# Patient Record
Sex: Female | Born: 1965 | Race: Black or African American | Hispanic: No | Marital: Married | Smoking: Current every day smoker
Health system: Southern US, Community
[De-identification: ages and names within clinical notes are randomized; demographics above are authoritative.]

## PROBLEM LIST (undated history)

## (undated) ENCOUNTER — Emergency Department (HOSPITAL_COMMUNITY): Discharge status: Absent without leave | Payer: Self-pay

## (undated) DIAGNOSIS — D649 Anemia, unspecified: Secondary | ICD-10-CM

## (undated) DIAGNOSIS — I10 Essential (primary) hypertension: Secondary | ICD-10-CM

## (undated) DIAGNOSIS — I619 Nontraumatic intracerebral hemorrhage, unspecified: Secondary | ICD-10-CM

## (undated) HISTORY — PX: MYOMECTOMY: SHX85

---

## 2004-04-21 ENCOUNTER — Encounter: Admission: RE | Admit: 2004-04-21 | Discharge: 2004-04-21 | Payer: Self-pay | Admitting: Gastroenterology

## 2004-12-30 ENCOUNTER — Encounter: Admission: RE | Admit: 2004-12-30 | Discharge: 2004-12-30 | Payer: Self-pay | Admitting: Obstetrics and Gynecology

## 2005-02-02 ENCOUNTER — Encounter (INDEPENDENT_AMBULATORY_CARE_PROVIDER_SITE_OTHER): Payer: Self-pay | Admitting: Specialist

## 2005-02-02 ENCOUNTER — Inpatient Hospital Stay (HOSPITAL_COMMUNITY): Admission: RE | Admit: 2005-02-02 | Discharge: 2005-02-04 | Payer: Self-pay | Admitting: Obstetrics and Gynecology

## 2007-02-27 ENCOUNTER — Other Ambulatory Visit: Admission: RE | Admit: 2007-02-27 | Discharge: 2007-02-27 | Payer: Self-pay | Admitting: Obstetrics and Gynecology

## 2007-04-04 ENCOUNTER — Ambulatory Visit (HOSPITAL_COMMUNITY): Admission: RE | Admit: 2007-04-04 | Discharge: 2007-04-04 | Payer: Self-pay | Admitting: Obstetrics and Gynecology

## 2008-02-19 ENCOUNTER — Ambulatory Visit (HOSPITAL_COMMUNITY): Admission: RE | Admit: 2008-02-19 | Discharge: 2008-02-19 | Payer: Self-pay | Admitting: Obstetrics and Gynecology

## 2008-11-14 ENCOUNTER — Encounter (INDEPENDENT_AMBULATORY_CARE_PROVIDER_SITE_OTHER): Payer: Self-pay | Admitting: General Surgery

## 2008-11-14 ENCOUNTER — Ambulatory Visit (HOSPITAL_COMMUNITY): Admission: RE | Admit: 2008-11-14 | Discharge: 2008-11-14 | Payer: Self-pay | Admitting: General Surgery

## 2010-08-30 ENCOUNTER — Emergency Department (HOSPITAL_COMMUNITY): Admission: EM | Admit: 2010-08-30 | Discharge: 2010-08-30 | Payer: Self-pay | Admitting: Emergency Medicine

## 2010-09-02 ENCOUNTER — Inpatient Hospital Stay (HOSPITAL_COMMUNITY): Admission: EM | Admit: 2010-09-02 | Discharge: 2010-09-04 | Payer: Self-pay | Admitting: Emergency Medicine

## 2010-11-28 ENCOUNTER — Encounter: Payer: Self-pay | Admitting: Obstetrics and Gynecology

## 2011-01-20 LAB — BASIC METABOLIC PANEL
BUN: 8 mg/dL (ref 6–23)
Chloride: 99 mEq/L (ref 96–112)
Glucose, Bld: 84 mg/dL (ref 70–99)
Potassium: 3.4 mEq/L — ABNORMAL LOW (ref 3.5–5.1)
Sodium: 140 mEq/L (ref 135–145)

## 2011-01-20 LAB — CBC
HCT: 40.8 % (ref 36.0–46.0)
Hemoglobin: 13.5 g/dL (ref 12.0–15.0)
MCHC: 33.1 g/dL (ref 30.0–36.0)
MCV: 82.8 fL (ref 78.0–100.0)
RDW: 15.4 % (ref 11.5–15.5)
WBC: 12.6 10*3/uL — ABNORMAL HIGH (ref 4.0–10.5)

## 2011-01-20 LAB — ANAEROBIC CULTURE: Gram Stain: NONE SEEN

## 2011-01-20 LAB — DIFFERENTIAL
Basophils Absolute: 0 10*3/uL (ref 0.0–0.1)
Eosinophils Relative: 1 % (ref 0–5)
Lymphocytes Relative: 9 % — ABNORMAL LOW (ref 12–46)
Monocytes Absolute: 1.1 10*3/uL — ABNORMAL HIGH (ref 0.1–1.0)
Monocytes Relative: 9 % (ref 3–12)
Neutro Abs: 10.3 10*3/uL — ABNORMAL HIGH (ref 1.7–7.7)

## 2011-01-20 LAB — STREP A DNA PROBE: Group A Strep Probe: NEGATIVE

## 2011-01-20 LAB — CULTURE, ROUTINE-ABSCESS: Gram Stain: NONE SEEN

## 2011-02-22 LAB — CBC
HCT: 34.8 % — ABNORMAL LOW (ref 36.0–46.0)
Hemoglobin: 11.1 g/dL — ABNORMAL LOW (ref 12.0–15.0)
RBC: 4.34 MIL/uL (ref 3.87–5.11)
WBC: 7.1 10*3/uL (ref 4.0–10.5)

## 2011-02-22 LAB — DIFFERENTIAL
Basophils Absolute: 0 10*3/uL (ref 0.0–0.1)
Eosinophils Relative: 2 % (ref 0–5)
Lymphocytes Relative: 21 % (ref 12–46)
Lymphs Abs: 1.5 10*3/uL (ref 0.7–4.0)
Monocytes Absolute: 0.9 10*3/uL (ref 0.1–1.0)
Monocytes Relative: 12 % (ref 3–12)
Neutro Abs: 4.6 10*3/uL (ref 1.7–7.7)

## 2011-02-22 LAB — APTT: aPTT: 34 seconds (ref 24–37)

## 2011-03-23 NOTE — Op Note (Signed)
NAME:  JAYLYNE, Jocelyn [name]             ACCOUNT NO.:  [id number]   MEDICAL RECORD NO.:  [id number]          PATIENT TYPE:  AMB   LOCATION:  SDC                           FACILITY:  [facility]   PHYSICIAN:  Fermin Schwab, MD   DATE OF BIRTH:  10-Feb-1966   DATE OF PROCEDURE:  02/19/2008  DATE OF DISCHARGE:  02/19/2008                               OPERATIVE REPORT   PREOPERATIVE DIAGNOSIS:  Intrauterine adhesions, pelvic adhesions, right  hydrosalpinx.   POSTOPERATIVE DIAGNOSIS:  Intrauterine adhesions, pelvic adhesions,  right hydrosalpinx, left adnexal adhesion.   PROCEDURE:  Hysteroscopy, hysteroscopic lysis of adhesions, laparoscopy,  lysis of adhesions, right salpingectomy, left salpingo-oophorolysis.   SURGEON:  Fermin Schwab, M.D.   ASSISTANT:  None.   ANESTHESIA:  General endotracheal.   COMPLICATIONS:  None.   ESTIMATED BLOOD LOSS:  100 mL   SPECIMEN:  adhesions and right tube to pathology   FINDINGS:  On hysteroscopy, there was a 1-cm long dense adhesion  starting from the fundus.  The uterus sounded to 9 cm.  On laparoscopy,  the uterus was slightly enlarged with myomas.  There were dense  adhesions between the right side of the fundus and the anterior  abdominal wall and bladder peritoneum.  There were also epiploic  adhesions to the posterior wall of the uterus.  The right fallopian tube  was densely adherent to the uterine fundus and to the posterior aspect  of the uterus in a coiled and convoluted state.  The fimbriated end  could only be visualized after extensive adhesiolysis.  It was noted  that the tube had a constriction band just proximal to the fimbria once  they were uncovered.  The right ovary had a 3-cm simple cyst.  The left  fallopian tube had normal fimbria.  However, the midportion of the tube  was kinked onto itself.  The tube was patent initially, and the spillage  turned from sluggish to more copious after left salpingolysis.  Two-  thirds of the  left ovary was covered with dense adhesions to the pelvic  sidewall.   DESCRIPTION OF PROCEDURE:  The patient was placed in the lithotomy  position.  Two grams of cefazolin were given intravenously for  prophylaxis.  General endotracheal anesthesia was started.  The patient  was placed in the lithotomy position and was prepped for laparoscopy and  hysteroscopy, and she was draped in a sterile manner.  A Foley catheter  was inserted.   The cervix was grasped with a tenaculum, and 5 mL of dilute vasopressin  solution (0.4 units per mL) was injected into the cervical stroma.  Using a 30-degree diagnostic hysteroscope and sorbitol solution via  hysteroscopic pump, video laparoscopy was started.  The above findings  were noted.  Using hysteroscopic scissors, the fundal adhesions were  lysed.  Next, a ZUMI catheter was inserted into the uterus after it  sounded to 3-1/2 inches, and its balloon was inflated.  This was used  for chromotubation and uterine manipulation during laparoscopy.   The surgeon was regloved, and the operative field was created on the  abdomen.  A 5-mm  infraumbilical incision was made after preemptive  anesthesia with 0.25% bupivacaine with 1:200,000 epinephrine.  A Veress  needle was inserted.  Its correct location was verified, and  pneumoperitoneum was created with carbon dioxide.  Two other 5-mm  incisions were made in each lower quadrant after preemptive anesthesia,  and 5-mm sleeves were used for ancillary instruments.  The above  findings were noted.  Using needle electrodes and a cutting current of  30 watts, careful adhesiolysis (salpingo-oophorolysis) on both sides was  done in succession.  First, the right salpingolysis was done.  At the  end of the salpingolysis, it was noted that the fimbriated end, though  visible, was not functional because of a constriction immediately  proximal to it.  In addition, the right fallopian tube had been  incohesively dense  adhesions.  As per preoperative discussion with the  patient, this tube was deemed nonfunctional, and decision was made to  remove the right hydrosalpinx.  This was completed with careful  coagulation of the tube very close to the myosalpinx and away from the  infundibulopelvic ligament and ovarian vasculature and using a maximum  setting of the Harmonic ACE.  The tube was removed in an EndoCatch after  the right lower incision was extended to 10 mm.  Next, using needle  electrode and extreme care, the left fallopian tube was unkinked by  salpingolysis.  Part of the left ovarian/pelvic sidewall adhesions could  be lysed, but due to the cohesive nature of these adhesions, complete  oophorolysis could not be achieved.  The procedure was terminated.  The  gas was allowed to escape.  Two sheets of Seprafilm were cut into small  pieces and turned into slurry in 40 mL of lactated Ringer's.  Using a  red rubber catheter, this was instilled over the right and left  adhesiolysis sites as well as fundus where the uterus was separated from  its attachment to the anterior abdominal wall and bladder peritoneum.  This was done after complete irrigation and aspiration and checking  hemostasis.  All of the instruments were removed.  The gas was allowed  to escape.  The right lower incision was closed using a 0 Vicryl suture.  The rest of the incisions were approximated with Dermabond.   A pediatric Foley balloon was inserted into the uterine cavity after the  ZUMI manipulator was removed, and it was inflated to 3 mL.  This will  stay in the uterus to lower the risk of adhesion reformation in the  uterus.  The patient will also take high-dose conjugated equine estrogen  for 30 days (2.5 b.i.d.) until the last 5 days of which  medroxyprogesterone acetate 10 mg daily will be added.  The patient will  also take doxycycline 100 mg b.i.d. for the next 2 weeks while the  intrauterine balloon catheter is in  place.   Estimated blood loss was 100 mL.  The patient tolerated the procedure  well and was transferred to the recovery room in satisfactory condition.      Fermin Schwab, MD  Electronically Signed     TY/MEDQ  D:  02/25/2008  T:  02/25/2008  Job:  [id number]   cc:   Gerald Leitz, MD

## 2011-03-23 NOTE — Op Note (Signed)
[name], [name]             ACCOUNT NO.:  [id number]   MEDICAL RECORD NO.:  [id number]          PATIENT TYPE:  AMB   LOCATION:  SDS                          FACILITY:  [facility]   PHYSICIAN:  Cherylynn Ridges, M.D.    DATE OF BIRTH:  [birth year]   DATE OF PROCEDURE:  DATE OF DISCHARGE:  11/14/2008                               OPERATIVE REPORT   PREOPERATIVE DIAGNOSIS:  Bilateral medial hard thigh masses.   POSTOPERATIVE DIAGNOSIS:  Bilateral medial hard thigh masses.   PROCEDURE:  Incisional biopsy and Tru-Cut biopsy of bilateral thigh  masses.   SURGEON:  Cherylynn Ridges, M.D.   ANESTHESIA:  General with some laryngeal airway.   ESTIMATED BLOOD LOSS:  Less than 20 mL.   COMPLICATIONS:  None.   CONDITION:  Stable.   FINDINGS:  Rock hard, what appears to be, saponified fat in the  subcutaneous tissue with a very hard consistency.  No acute  inflammation.   INDICATIONS FOR OPERATION:  The patient is an otherwise healthy 45-year-  old with the growing, large, hard medial thigh masses who comes in for  biopsy.   OPERATION:  The patient was taken to the operating room and placed on  the table in supine position.  After an adequate general laryngeal  airway anesthetic was administered, she was frog-legged then prepped and  draped in the usual sterile manner.   Initial biopsies were done of the left thigh masses which is the larger  of the 2.  It measured approximately 15 x 20-25 cm in size in the distal  aspect of the medial thigh.  Our incision for the biopsy was a  transverse incision at the upper anterior border of the mass and what  appeared to be normal skin overlying the beginning of the large mass in  the upper portion.  We dissected down towards the mass medially and  posteriorly using hemostat and once we got to the hard substance, wedge  biopsies with a #10 blade were made into the mass and sent with an Allis  clamp retrieving it.  Two such wedge biopsies were made  and then we used  a Tru-Cut needle to get 4 deeper biopsies into the mass.   A similar biopsy was done on the right side with this incision being  just superior to the mass on the medial aspect.  This was a smaller mass  but 2 wedge biopsies were made and then Tru-Cut x4 also taken in this  mass.  Care was taken not to go deep into the muscle and possibly enter  the nerve root vessels.  Once we had adequate hemostasis on both sides  and were irrigated with saline, we closed the skin using interrupted 3-0  nylon sutures.  Then used a mild  pressure dressing of Ace wrap, Betadine ointment, and 4 x 4s in order to  close.  All counts were correct.  The patient was taken to the recover  room in stable condition.  We were able to palpate very strong posterior  tibial and dorsalis pedis pulses bilaterally at the end of the  case.      Cherylynn Ridges, M.D.  Electronically Signed     JOW/MEDQ  D:  11/14/2008  T:  11/15/2008  Job:  [id number]

## 2011-03-26 NOTE — H&P (Signed)
NAME:  Jocelyn [name]             ACCOUNT NO.:  [id number]   MEDICAL RECORD NO.:  [id number]          PATIENT TYPE:  AMB   LOCATION:  SDC                           FACILITY:  [facility]   PHYSICIAN:  Fermin Schwab, MD   DATE OF BIRTH:  02/11/1966   DATE OF ADMISSION:  02/19/2008  DATE OF DISCHARGE:  02/19/2008                              HISTORY & PHYSICAL   CHIEF COMPLAINT:  Pelvic adhesions, intrauterine adhesions, rule out  right hydrosalpinx.   HISTORY OF PRESENT ILLNESS:  Jocelyn [name] is a 45 year old gravida 0,  African-American female referred to be by Dr. Richardson Dopp because of abnormal  uterine cavity and abnormal right tube on a recent hysterosalpingogram  on Apr 04, 2007.  She has a history of myomectomy by Dr. Annabell Howells in 2006,  which was done by a laparotomy.  An HSG showed an indentation in the mid  fundal portion of the endometrial cavity, probably representing  adhesion.  The left tube was classified as showing spillage, but the  right tube showed moderate dilation with no spillage.   PAST MEDICAL HISTORY:  Myomectomy via laparotomy done by Dr. Annabell Howells in  2006.   CURRENT MEDICATIONS:  None.   ALLERGIES:  No known allergies.   REVIEW OF SYSTEMS:  All 12 systems were reviewed, and it is negative.   FAMILY HISTORY:  Her sister has sickle cell trait.  There is no other  history of Hailey-Hailey disease.  No history of cancer of breast,  ovary, uterus, or colon.   PHYSICAL EXAMINATION:  VITAL SIGNS:  Height 5 feet 7 inches, weight 228,  blood pressure 140/80, and pulse 80.  GENERAL:  Well-developed, well-nourished white female in no acute  distress.  NECK:  No lymphadenopathy.  LUNGS:  Clear to auscultation.  HEART:  Regular rhythm.  ABDOMEN:  Soft, obese, and nontender.  Well-healed Pfannenstiel scar.  No hepatosplenomegaly.  PELVIC:  Deferred.   IMPRESSION:  1. Intrauterine synechia, post myomectomy.  2. Right hydrosalpinx.  3. Advanced reproductive age.   PLAN:   The patient is scheduled for hysteroscopy, lysis of adhesions,  and laparoscopy with lysis of adhesions, and possible removal of right  hydrosalpinx.  I have discussed with the patient the conditions under  which a  salpingectomy rather than a salpingolysis would be recommended; these  would be dense, cohesive adhesions with high likelihood of recurrence  and reformation of a hydrosalpinx.  I have discussed the benefits and  risks of the procedure with the patient.  She verbalized understanding.      Fermin Schwab, MD  Electronically Signed     TY/MEDQ  D:  03/04/2008  T:  03/04/2008  Job:  [id number]

## 2011-03-26 NOTE — Discharge Summary (Signed)
NAME:  Jocelyn [name], Jocelyn [name]             ACCOUNT NO.:  [id number]   MEDICAL RECORD NO.:  [id number]          PATIENT TYPE:  INP   LOCATION:  9312                          FACILITY:  [facility]   PHYSICIAN:  Richardean Sale, M.D.   DATE OF BIRTH:  [birth year]   DATE OF ADMISSION:  02/02/2005  DATE OF DISCHARGE:  02/04/2005                                 DISCHARGE SUMMARY   ADMISSION DIAGNOSIS:  45 year old African American female with symptomatic  uterine fibroids for abdominal myomectomy.   PROCEDURE:  Abdominal myomectomy performed on February 02, 2005.   SPECIMENS:  Uterine leiomyoma.   HISTORY OF PRESENT ILLNESS AND HOSPITAL COURSE:  For complete history and  physical details, please see history and physical on admission.  This is a  45 year old gravida 0 African American female with symptomatic uterine  fibroids who desires uterine preservation for possible pregnancy in the  future.  The patient underwent an uncomplicated abdominal myomectomy with  lysis of adhesions on February 02, 2005.  Operative findings revealed multiple  uterine fibroids, with the uterus approximately 20-weeks size.  The  abdominal fundal fibroid was 12 x 9 cm.  There were adhesions of the omentum  to the serosal surface of the uterus and the left fallopian tube.  The  fallopian tubes appeared clubbed.  Both ovaries appeared normal.   The patient's postoperative course was unremarkable.  She remained afebrile.  She was tolerating a regular diet, was ambulating and voiding without  difficulty, passing flatus on postoperative day #2, and she was subsequently  discharged to home.   DISPOSITION:  To home.   CONDITION ON DISCHARGE:  Good.   FOLLOW UP:  The patient will follow up within the next week to have her  staples removed and again in four to six weeks for a routine postoperative  visit.   DISCHARGE MEDICATIONS:  1.  Percocet one to two tablets p.o. q.4-6h. p.r.n. pain.  2.  Ibuprofen 800 mg p.o. q.8h. p.r.n.  pain.  3.  Ferrous sulfate one p.o. daily.   LABORATORY DATA:  On postoperative day #1, hemoglobin 9.8, hematocrit 30.1,  white count 13.9, platelet count 406.      JW/MEDQ  D:  03/13/2005  T:  03/14/2005  Job:  [id number]

## 2011-03-26 NOTE — H&P (Signed)
NAME:  Jocelyn [name], Jocelyn [name] NO.:  [id number]   MEDICAL RECORD NO.:  [id number]          PATIENT TYPE:  INP   LOCATION:  NA                            FACILITY:  [facility]   PHYSICIAN:  Richardean Sale, M.D.   DATE OF BIRTH:  [birth year]   DATE OF ADMISSION:  DATE OF DISCHARGE:                                HISTORY & PHYSICAL   SUBJECTIVE:  A 45 year old gravida 0 African-American female with  symptomatic uterine fibroids presents for myomectomy. The patient initially  presented in September 2005 with complaints of menorrhagia. She was found to  have a hemoglobin of 7 and an markedly-enlarged uterus. The patient was  subsequently placed on Lupron to control bleeding and on Aygestin to control  hot flashes. Her hemoglobin is now up to 11. MRI has shown an enlarged  uterus with a dominant fundal fibroid of 11.7 x 10.3 x 10.9 cm. There are  multiple other fibroids present. The patient desires uterine conservation as  she is a nulligravida and desires pregnancy if at all possible.   PAST MEDICAL HISTORY:  Anemia secondary to menometrorrhagia.   PAST SURGICAL HISTORY:  None.   OBSTETRICAL HISTORY:  Gravida 0.   GYNECOLOGICAL HISTORY:  Menometrorrhagia, uterine fibroids. No history of  abnormal Pap smears or STDs.   FAMILY HISTORY:  No congenital anomalies or birth defects. No breast,  ovarian, uterine, or colon cancer. No known bleeding disorders, blood  clotting problems, or any complications with anesthesia. No history of DVT  in any family members.   SOCIAL HISTORY:  She is married. Denies tobacco, alcohol, or drugs.   REVIEW OF SYSTEMS:  Negative for chest pain, shortness of breath, diarrhea,  constipation, nausea, vomiting, or other constitutional symptoms.   PHYSICAL EXAMINATION:  VITAL SIGNS:  She is afebrile, vital signs are  stable.  GENERAL:  She is a well-developed, well-nourished black female who is in no  apparent distress.  HEART:  Regular rate and  rhythm.  LUNGS:  Clear to auscultation bilaterally.  NECK AND THYROID:  Within normal limits without masses.  ABDOMEN:  Soft, nontender, nondistended. Liver and spleen are normal. Fundus  is palpable midway in-between the pubis and the umbilicus.  PELVIC:  Enlarged fibroid uterus. Cervix difficult to visualize secondary to  it being displaced posteriorly. No obvious lesions.  EXTREMITIES:  No cyanosis, clubbing, or edema.   ASSESSMENT:  A 45 year old gravida 0 African-American female with  symptomatic uterine fibroids resulting in significant iron deficiency anemia  who is now stabilized on Lupron therapy. The patient desires uterine  conservation and desires pregnancy if at all possible in the future.   PLAN:  Will proceed with abdominal myomectomy. We have reviewed the risks of  the procedure which include but are not limited to hemorrhage requiring  transfusion; infection which could prolong hospitalization or cause  intraabdominal scarring; possibility of injury to the bowel, the bladder, or  other abdominal organs which could require additional surgery and cause  complications in the future. We reviewed the risks of scarring of the bowel  to the uterus which could cause chronic pain or require  additional surgery  in the future. We reviewed the risks of DVT and pulmonary embolus and  discussed anesthetic-related complications. Reviewed the need to proceed  with cesarean section should the patient become pregnant in the future. Also  reviewed the need to perform emergent hysterectomy should uncontrollable  bleeding occur. Also explained to the patient that the patient may still not  be able to conceive after myomectomy is performed and reviewed that there is  a 20% chance that these fibroids will recur. Also reviewed with the patient  that there is a less than 1% chance that one of these fibroids could contain  or could become a malignancy. The patient voices understanding of all  these  risks and desires to proceed and informed consent is obtained.      JW/MEDQ  D:  02/01/2005  T:  02/01/2005  Job:  [id number]

## 2011-03-26 NOTE — Op Note (Signed)
NAME:  Jocelyn [name], Jocelyn [name]             ACCOUNT NO.:  [id number]   MEDICAL RECORD NO.:  [id number]          PATIENT TYPE:  INP   LOCATION:  9312                          FACILITY:  [facility]   PHYSICIAN:  Richardean Sale, M.D.   DATE OF BIRTH:  07/20/1966   DATE OF PROCEDURE:  02/02/2005  DATE OF DISCHARGE:                                 OPERATIVE REPORT   PREOPERATIVE DIAGNOSIS:  Symptomatic uterine fibroids, desires uterine  conservation.   POSTOPERATIVE DIAGNOSIS:  Symptomatic uterine fibroids, desires uterine  conservation.   PROCEDURE:  Abdominal myomectomy with lysis of adhesions.   SURGEON:  Richardean Sale, M.D.   ASSISTANT:  Maxie Better, M.D.   ANESTHESIA:  General endotracheal anesthesia.   COMPLICATIONS:  None.   ESTIMATED BLOOD LOSS:  500.   URINE OUTPUT:  300 mL clear.   OPERATIVE FINDINGS:  Markedly enlarged uterus, palpable at the uterine  fundus on interoperative bimanual exam under anesthesia, minimal  lateral  mobility, cervix deviated anteriorly with palpable 5 cm fibroid in the  posterior cul-de-sac.  Interoperative findings were approximately 20 weeks  size uterus with a dominant fundal fibroid that was intramural, 12 by 9 cm,  9 additional fibroids, two of which were intracavitary.  Estimated weight  809 grams, largest fibroid measuring 12 by 9 cm.  Adhesions of the omentum  to the serosal surface of the uterus and the left fallopian tube, left  fallopian tube tucked under ovary, fimbria not identified, right fallopian  tube with clubbed fimbria, normal appearing left ovary, right ovary slightly  attenuated.   COMPLICATIONS:  None.   SPECIMENS:  Ten uterine fibroids sent to pathology.   INDICATIONS FOR PROCEDURE:  This is a 45 year old gravida black female with  symptomatic uterine fibroids and significant anemia with a fundus palpable  at the umbilicus.  The patient has undergone preoperative treatment with  Lupron for six months.  Her hemoglobin  has improved from 7 up to 12.8 today.  The patient underwent MRI for evaluation of uterine fibroids as the uterus  was too large to visualize on ultrasound.  The uterine size is 18 cm by 10  by 10 cm with a dominant fundal fibroid measuring 11 cm in maximum diameter  and there are additional smaller uterine fibroids throughout the uterus.  The patient has never been able to conceive and desires pregnancy if at all  possible, and wishes to retain her uterus.   Prior to the procedure, the risks, benefits, and alternatives to myomectomy  were reviewed with the patient, given the marked size of the uterus, we  reviewed with the patient the possible need to perform urgent  hysterectomy  should interoperative bleeding occur that cannot be managed conservatively.  We discussed the risks which include but are not limited to hemorrhage  requiring transfusion or hysterectomy, infection, injury to the bowel or the  bladder, the ureters or other intra-abdominal organs, all of which could  cause scarring in the future or the possibility of future surgery and  possibly causing chronic pain.  I also reviewed the risks of DVT,  anesthesia, pulmonary embolus.  The  patient voiced understanding of all  these risks and agrees to proceed.  I reviewed with the patient that due to  the large size of the uterus, a midline vertical incision may be required.  I also reviewed with the patient that even if all the fibroids are  successfully removed, she may still not be able to conceive in the future  and explained to the patient that she would require cesarean section in the  future if multiple uterine incisions are made or if the endometrial cavity  is entered.  The patient voiced understanding of all the above risks and  desires to proceed.  Informed consent was obtained before proceeding to the  OR.   DESCRIPTION OF PROCEDURE:  The patient was taken to the operating room where  she was given general anesthesia,  a bimanual exam was then performed under  anesthesia which revealed markedly distorted immobile uterus with the fundus  palpable at the umbilicus.  There was essentially no lateral mobility.  The  cervix was deviated anteriorly and there was a 5 cm fibroid palpable in  the  posterior cul-de-sac.  Given the immobility of the uterus, the decision was  made to proceed with a vertical skin incision.  The patient was then prepped  and draped in the usual sterile fashion with Betadine and a Foley catheter  was placed.  A midline vertical skin incision was then made with the scalpel  and this was carried down sharply to the fascia.  The fascia was then  incised and the incision was extended superiorly and inferiorly.  The  midline was then identified, the rectus  muscles were separated, and the  peritoneum was grasped between two pickups and entered sharply with  Metzenbaum scissors.  This incision was then extended superiorly and  inferiorly with good visualization of the bladder.  The uterus was then  palpated, it was markedly enlarged.  An attempt was made to deliver the  uterus through the incision but was unsuccessful, therefore, the skin  incision had to be extended to approximately 3 cm beneath the umbilicus.  Once an adequate incision was made, the uterus was palpated and with some  difficulty was finally delivered through the incision.  The uterus was  approximately 20 weeks size, the peritoneum had essentially encased  the  uterus, and there were some peritoneal adhesions that had to be incised in  order to manipulate the uterus.  In addition, the omentum was adherent to  the uterus on the posterior aspect and also over the left adnexa and these  adhesions were taken down with both sharp and blunt dissection, any areas of  bleeding were cauterized with the Bovie.  Once the adhesions were removed  and the uterus could be thoroughly inspected, it was noted that the large, dominant fibroid  was extending off the anterior portion of the uterus.  There were fibroids palpable anteriorly on the lower uterine segment that  were above the bladder reflection.  On the posterior aspect of the uterus,  there was a 5 cm fibroid extending from the lower uterine segment and this  was presumably the fibroid that was palpated in the posterior cul-de-sac.  The ovaries were very difficult to visualize.  The left ovary appeared  normal.  The left fallopian tube appeared normal but the fimbriated end  could not be identified as it was tucked underneath the left ovary.  The  right fallopian tube was visualized but the fimbriated end was clubbed.  The  right ovary was somewhat attenuated and stretched out over the right side of  the uterus.  The cul-de-sac was palpated and was free of any adhesions.  At  this point, 10 mL of dilute Vasopressin were then injected on the anterior  aspect of the uterus over the large dominant fibroid.  An incision was made  in the uterus with the scalpel.  The edges were then held on traction with  allis clamps.  Three towel clamps were used to grasp the fibroid, and with  both sharp and blunt dissection, the fibroid was successfully removed.  It  was measured in the OR and was approximately 12 cm.  In the process of  removing the fibroid, the endometrial cavity was entered and as it did have  a submucosal component but was predominantly intramural.  The endometrial  cavity was easily visualized and contained within it two additional  fibroids, the largest of which was approximately 4 cm in diameter.  Palpation of the endometrial cavity showed there was another smaller fibroid  present.  This fibroid was removed through this same incision using the same  technique as by grasping the incisional edges with Allis clamps, grasping  the fibroid with the towel clip, and removing the fibroid with both blunt  and sharp dissection.  Once this fibroid was removed, the larger  submucosal  fibroid was very deep inside the uterine cavity and was actually in the  lower uterine segment almost to the cervix.  It was felt that it would be  much easier to remove this fibroid through a posterior incision as it felt  as though it was adjacent to the larger posterior fibroid that was palpated  in the cul-de-sac.  Therefore, a second uterine incision was made along the  posterior aspect of the uterus and this was first made by injecting  Vasopressin for hemostasis, the scalpel was then used, the edges of the  incision were grasped with Allis clamps, and the fibroid grasped with a  towel clip.  Using sharp and blunt dissection, the approximately 5 cm  posterior fibroid was removed.  Adjacent to it there was a smaller fibroid  present that was removed using a similar technique.  The posterior  submucosal fibroid was removed through this incision, as well.  The incision  was closed, the first layer consisted of deep sutures using 2-0 Vicryl to imbricate the myometrium over the endometrial cavity incision.  Once the  deep sutures were placed, additional sutures were placed for hemostasis and  the  serosa was closed with a running 4-0 Vicryl suture in a baseball stitch  fashion.  Once the incision was hemostatic, the attention was turned back to  the largest anterior incision.  Palpation revealed there were no other  fibroids reachable through this incision, therefore, this incision was  closed in multiple layers, the deep sutures were placed using 2-0 Vicryl and  the serosa was repaired using 4-0 Vicryl in a running baseball stitch  fashion.  At this point, there were three smaller pedunculated fibroids  along the left side of the uterus.  These were removed using the Bovie, each  of these fibroids was approximately 1-2 cm in maximal diameter.  At this  point, the anterior lower uterine segment was palpated.  There were at least  two fibroids present along the lower uterine  segment, these were above the  bladder refection.  A third uterine incision was then made, first by  injecting dilute Vasopressin  and a transverse incision was made in the lower  uterine segment.  Through this, three fibroids were removed using similar  technique by grasping the edge of the incision with Allis clamps, grasping  the fibroid with towel clips, and dissecting the fibroids out with both  sharp and blunt dissection.  Once the fibroids were removed, the incision  was closed in multiple layers using 2-0 Vicryl for the deep sutures and 4-0  Vicryl for the serosal stitch.  At this point, all three uterine incisions  had been closed and the uterus was inspected for hemostasis.  There were no  additional palpable uterine fibroids.  At this point, the uterus was  returned to the abdomen and the pelvis was copiously irrigated with warm,  normal saline.  Two laparotomy sponges had been placed at the beginning of  the procedure to move the bowel out of the way and these were removed.  Once  adequate hemostasis was assured, three pieces of Intercede were placed over  the incisions to help prevent postoperative adhesion formation.  The  peritoneal surfaces were then inspected and any areas of bleeding were  cauterized with the Bovie.  The abdominal peritoneal incision was closed  using a running 3-0 Monocryl suture.  The subfascial edges were inspected  and were hemostatic.  The fascia was closed with looped PDS suture, the  subcutaneous space was reapproximated using interrupted plain gut sutures,  and the skin was closed with staples.  The patient tolerated the procedure  well.  All sponge, lap, needle, and instrument counts were correct x 2.  She  was taken to the recovery room awake and in stable condition.  There were no  complications.      JW/MEDQ  D:  02/02/2005  T:  02/02/2005  Job:  [id number]

## 2011-08-03 LAB — CBC
HCT: 38.8
Hemoglobin: 13.2
RBC: 4.64
RDW: 14.1
WBC: 7.6

## 2011-08-03 LAB — PREGNANCY, URINE: Preg Test, Ur: NEGATIVE

## 2012-04-17 ENCOUNTER — Ambulatory Visit: Payer: 59

## 2012-04-17 ENCOUNTER — Ambulatory Visit (INDEPENDENT_AMBULATORY_CARE_PROVIDER_SITE_OTHER): Payer: 59 | Admitting: Family Medicine

## 2012-04-17 VITALS — BP 138/98 | HR 84 | Temp 98.0°F | Resp 18 | Ht 67.0 in | Wt 216.0 lb

## 2012-04-17 DIAGNOSIS — M25561 Pain in right knee: Secondary | ICD-10-CM

## 2012-04-17 DIAGNOSIS — I10 Essential (primary) hypertension: Secondary | ICD-10-CM

## 2012-04-17 DIAGNOSIS — M25569 Pain in unspecified knee: Secondary | ICD-10-CM

## 2012-04-17 MED ORDER — TRAMADOL-ACETAMINOPHEN 37.5-325 MG PO TABS: 1.0000 | ORAL_TABLET | Freq: Four times a day (QID) | ORAL | Status: AC | PRN

## 2012-04-17 MED ORDER — TRIAMTERENE-HCTZ 37.5-25 MG PO TABS: 1.0000 | ORAL_TABLET | Freq: Every day | ORAL | Status: DC

## 2012-04-17 NOTE — Patient Instructions (Signed)
Patient information: Meniscal tear (The Basics)View in SpanishWritten by the doctors and editors at [employer]  What is a meniscal tear? -- A meniscal tear is a condition that causes knee pain and other knee symptoms. It happens when a part of the knee joint called the "meniscus" tears. The meniscus is the cushion of rubbery material (cartilage) between the thigh bone and shin bone (figure 1). There are different kinds of meniscal tears, depending on the part of the cartilage that tears and how it tears. A meniscal tear can happen suddenly, such as during a sports injury. It can also happen slowly over time. This is more common in older adults. What are the symptoms of a meniscal tear? -- Symptoms of a meniscal tear can include: Knee pain  Knee swelling (from a collection of fluid around the knee)  The knee locking, not straightening all the way, or feeling like it "catches" on something as it straightens  The knee giving out, feeling unstable, or feeling like it won't support you  Knee stiffness Is there a test for a meniscal tear? -- Yes. Your doctor or nurse will ask about your symptoms and do an exam to check how your knee and leg move.  Your doctor or nurse might order an X-ray of your knee. He or she might also order an imaging test such as an MRI scan. Imaging tests create pictures of the inside of the body. If your doctor is still unsure whether you have a meniscal tear, he or she might recommend surgery called "arthroscopy." This lets the doctor look inside your knee at the meniscus. During arthroscopy, the doctor makes a few small cuts around the knee. Then he or she inserts long, thin tools into the knee joint. One tool has a camera on the end (figure 2). It sends pictures to a TV screen that the doctor sees. If you have a meniscal tear, the doctor can use the tools to treat it. How is a meniscal tear treated? -- Treatment depends on how small or large the tear is, your symptoms, and your  individual situation. Treatment for a meniscal tear usually involves: Resting your knee - Avoid movements that worsen the pain. Try not to squat, kneel, or run.  Raising your knee above the level of your heart, for example, by propping it up on pillows - This is helpful only for the first few days after an injury.  Putting ice on your knee - Put a cold gel pack, bag of ice, or bag of frozen vegetables on the knee every 1 to 2 hours, for 15 minutes each time. Put a thin towel between the ice (or other cold object) and your skin. Use the ice (or other cold object) for at least 6 hours after the injury. Some people find it helpful to ice up to 2 days after the injury.  Using crutches to walk, if you have severe pain  Wearing a knee brace, if your knee feels unstable  Taking a pain-relieving medicine, such as acetaminophen (sample brand name: Tylenol) or ibuprofen (sample brand names: Advil, Motrin)  Doing exercises to strengthen the muscles around your knee - After your pain improves, your doctor or nurse will show you exercises to do. He or she might also have you work with a physical therapist (exercise expert). In many cases, symptoms improve with this treatment. But if you have a large tear, or your symptoms don't improve, your doctor might recommend surgery. During surgery, the doctor will treat  the tear. This might involve removing part or all of your meniscus. How long does a meniscal tear take to heal? -- A meniscal tear can take weeks to heal, depending on the type of tear. Healing time also depends on the person. Healthy children usually heal much more quickly than older adults or adults with other medical problems. When should I call my doctor or nurse? -- After treatment, your doctor or nurse will tell you when to call him or her. In general, you should call him or her if: You have severe pain, or your pain or swelling gets worse.  You have a fever with knee pain, swelling, and redness.   You have numbness or tingling in the lower leg, foot, or toes.  Your knee locks and you can't get it unlocked. What else should I know? -- People with a meniscal tear have a higher chance of getting a condition called osteoarthritis later on. Osteoarthritis is the most common form of arthritis, which is the general term for inflammation of the joints. Osteoarthritis can cause joint pain, stiffness, and swelling. More on this topic   Hypertension As your heart beats, it forces blood through your arteries. This force is your blood pressure. If the pressure is too high, it is called hypertension (HTN) or high blood pressure. HTN is dangerous because you may have it and not know it. High blood pressure may mean that your heart has to work harder to pump blood. Your arteries may be narrow or stiff. The extra work puts you at risk for heart disease, stroke, and other problems.  Blood pressure consists of two numbers, a higher number over a lower, 110/72, for example. It is stated as "110 over 72." The ideal is below 120 for the top number (systolic) and under 80 for the bottom (diastolic). Write down your blood pressure today. You should pay close attention to your blood pressure if you have certain conditions such as:  Heart failure.   Prior heart attack.   Diabetes   Chronic kidney disease.   Prior stroke.   Multiple risk factors for heart disease.  To see if you have HTN, your blood pressure should be measured while you are seated with your arm held at the level of the heart. It should be measured at least twice. A one-time elevated blood pressure reading (especially in the Emergency Department) does not mean that you need treatment. There may be conditions in which the blood pressure is different between your right and left arms. It is important to see your caregiver soon for a recheck. Most people have essential hypertension which means that there is not a specific cause. This type of high  blood pressure may be lowered by changing lifestyle factors such as:  Stress.   Smoking.   Lack of exercise.   Excessive weight.   Drug/tobacco/alcohol use.   Eating less salt.  Most people do not have symptoms from high blood pressure until it has caused damage to the body. Effective treatment can often prevent, delay or reduce that damage. TREATMENT  When a cause has been identified, treatment for high blood pressure is directed at the cause. There are a large number of medications to treat HTN. These fall into several categories, and your caregiver will help you select the medicines that are best for you. Medications may have side effects. You should review side effects with your caregiver. If your blood pressure stays high after you have made lifestyle changes or started on  medicines,   Your medication(s) may need to be changed.   Other problems may need to be addressed.   Be certain you understand your prescriptions, and know how and when to take your medicine.   Be sure to follow up with your caregiver within the time frame advised (usually within two weeks) to have your blood pressure rechecked and to review your medications.   If you are taking more than one medicine to lower your blood pressure, make sure you know how and at what times they should be taken. Taking two medicines at the same time can result in blood pressure that is too low.  SEEK IMMEDIATE MEDICAL CARE IF:  You develop a severe headache, blurred or changing vision, or confusion.   You have unusual weakness or numbness, or a faint feeling.   You have severe chest or abdominal pain, vomiting, or breathing problems.  MAKE SURE YOU:   Understand these instructions.   Will watch your condition.   Will get help right away if you are not doing well or get worse.  Document Released: 10/25/2005 Document Revised: 10/14/2011 Document Reviewed: 06/14/2008 [facility] Patient Information 2012 [place], [place].

## 2012-04-17 NOTE — Progress Notes (Signed)
  Subjective:    Patient ID: Jocelyn [name], female    DOB: Jan 10, 1966, 46 y.o.   MRN: [id number]  HPI Knee pain x 2 days.  Pain started early yesterday.  Pt states that week prior, she noticed recurrent knee locking and giving away.  No prior history of knee injury.  No associated trauma.  Pain more anterior.  No noted swelling.  Has been able to bear weight, though pain worse with knee bending.   Pt with also noted elevated BP.  Pt states that she has had elevated BPs in the past.  No HA, CP, SOB.   Review of Systems See HPI, otherwise ROS negative     Objective:   Physical Exam Gen: in bed, NAD CV: RRR PULM: CTAB  MSK: Knee: Normal to inspection with no erythema or effusion or obvious bony abnormalities. Palpation normal with no warmth or joint line tenderness or patellar tenderness or condyle tenderness. Decreased flexion and extension Ligaments with solid consistent endpoints including ACL, PCL, LCL, MCL. + mcmurrays, thesaly, apley  Non painful patellar compression. Patellar and quadriceps tendons unremarkable. Hamstring and quadriceps strength is normal.    [facility] reading (PRIMARY) by  Dr. Alvester Morin. Knee xrays preliminarily negative for intra-articular pathology    Assessment & Plan:  Knee Pain: Likely meniscal injury given exam. Will brace. Discussed general strengthening. ultracet for pain (avoiding NSAIDs given HTN). Follow up with sports medicine.   HTN: Elevated BP on follow up recheck. Will check baseline labs, CBC, CMET, direct ldl, TSH.  Will start on maxzide. Follow up labs in 7-10 days. CV red flags discussed. Handout given.     The patient and/or caregiver has been counseled thoroughly with regard to treatment plan and/or medications prescribed including dosage, schedule, interactions, rationale for use, and possible side effects and they verbalize understanding. Diagnoses and expected course of recovery discussed and will return if not improved as  expected or if the condition worsens. Patient and/or caregiver verbalized understanding.

## 2012-04-18 LAB — CBC
HCT: 37.4 % (ref 36.0–46.0)
Hemoglobin: 12.6 g/dL (ref 12.0–15.0)
MCH: 27.5 pg (ref 26.0–34.0)
RBC: 4.58 MIL/uL (ref 3.87–5.11)

## 2012-04-18 LAB — TSH: TSH: 2.729 u[IU]/mL (ref 0.350–4.500)

## 2012-04-18 LAB — COMPREHENSIVE METABOLIC PANEL
Albumin: 4.4 g/dL (ref 3.5–5.2)
BUN: 11 mg/dL (ref 6–23)
CO2: 26 mEq/L (ref 19–32)
Glucose, Bld: 86 mg/dL (ref 70–99)
Sodium: 138 mEq/L (ref 135–145)
Total Bilirubin: 0.4 mg/dL (ref 0.3–1.2)
Total Protein: 8 g/dL (ref 6.0–8.3)

## 2012-04-18 LAB — LDL CHOLESTEROL, DIRECT: Direct LDL: 115 mg/dL — ABNORMAL HIGH

## 2013-03-16 ENCOUNTER — Ambulatory Visit (INDEPENDENT_AMBULATORY_CARE_PROVIDER_SITE_OTHER): Payer: 59 | Admitting: Physician Assistant

## 2013-03-16 VITALS — BP 132/82 | HR 87 | Temp 99.5°F | Resp 17 | Ht 67.0 in | Wt 230.0 lb

## 2013-03-16 DIAGNOSIS — M25569 Pain in unspecified knee: Secondary | ICD-10-CM

## 2013-03-16 DIAGNOSIS — M25561 Pain in right knee: Secondary | ICD-10-CM

## 2013-03-16 MED ORDER — MELOXICAM 7.5 MG PO TABS: 7.5000 mg | ORAL_TABLET | Freq: Every day | ORAL | Status: DC

## 2013-03-16 MED ORDER — HYDROCODONE-ACETAMINOPHEN 5-325 MG PO TABS: 1.0000 | ORAL_TABLET | Freq: Every evening | ORAL | Status: DC | PRN

## 2013-03-16 NOTE — Progress Notes (Signed)
   [address] [place] [address]   Phone [phone]  Subjective:    Patient ID: Jocelyn [name], female    DOB: 06/21/1966, 47 y.o.   MRN: [id number]  HPI Pt presents to clinic with R knee pain for the last 2 weeks.  She does not remember hurting her knee.  She had something similar in June and also did not know what she did to cause the problem.  Currently she does not have swelling.  She does feel popping and clicking when she walks and will sometimes get the feeling that her knee is locking.  She has never had her knee give away but has felt that might happen.  She has tried tylenol and it helps a little but her sisters left over Oxycodone and it did help.  She is having trouble sleeping at night because of the pain.  She had a referral to ortho but she never rescheduled her appt.     Review of Systems  Musculoskeletal: Positive for arthralgias and gait problem (2nd to pain). Negative for joint swelling.       Objective:   Physical Exam  Vitals reviewed. Constitutional: She is oriented to person, place, and time. She appears well-developed and well-nourished.  HENT:  Head: Normocephalic and atraumatic.  Right Ear: External ear normal.  Left Ear: External ear normal.  Pulmonary/Chest: Effort normal.  Musculoskeletal:       Right knee: She exhibits decreased range of motion (2nd to pain ) and abnormal meniscus (? positive mcmurrys - pt has pain with internal rotation of hip and extension of knee - ). She exhibits no swelling, normal alignment, no LCL laxity, normal patellar mobility, no bony tenderness and no MCL laxity. No medial joint line, no lateral joint line, no MCL, no LCL and no patellar tendon tenderness noted.  Significantly guarded exam -  Neurological: She is alert and oriented to person, place, and time.  Skin: Skin is warm and dry.  Psychiatric: She has a normal mood and affect. Her behavior is normal. Judgment and thought content normal.       Assessment &  Plan:  Knee pain, acute, right - I think that the patient has a torn cartilage.  I reviewed her xrays from June that show no degenerative changes.  Her weight is not helping.  I discussed with patient the possible options and pt would like to have a referral to ortho - Plan: HYDROcodone-acetaminophen (NORCO/VICODIN) 5-325 MG per tablet for sleep, meloxicam (MOBIC) 7.5 MG tablet, Ambulatory referral to Orthopedic Surgery Pt to use ice and rest.  Benny Lennert PA-C 03/16/2013 5:19 PM

## 2013-12-09 ENCOUNTER — Ambulatory Visit (INDEPENDENT_AMBULATORY_CARE_PROVIDER_SITE_OTHER): Payer: 59 | Admitting: Internal Medicine

## 2013-12-09 VITALS — BP 144/90 | HR 84 | Temp 98.2°F | Resp 16 | Ht 67.0 in | Wt 234.0 lb

## 2013-12-09 DIAGNOSIS — F17200 Nicotine dependence, unspecified, uncomplicated: Secondary | ICD-10-CM

## 2013-12-09 DIAGNOSIS — I10 Essential (primary) hypertension: Secondary | ICD-10-CM | POA: Insufficient documentation

## 2013-12-09 DIAGNOSIS — D509 Iron deficiency anemia, unspecified: Secondary | ICD-10-CM | POA: Insufficient documentation

## 2013-12-09 DIAGNOSIS — Z6836 Body mass index (BMI) 36.0-36.9, adult: Secondary | ICD-10-CM | POA: Insufficient documentation

## 2013-12-09 LAB — IRON AND TIBC
%SAT: 4 % — ABNORMAL LOW (ref 20–55)
IRON: 16 ug/dL — AB (ref 42–145)
TIBC: 362 ug/dL (ref 250–470)
UIBC: 346 ug/dL (ref 125–400)

## 2013-12-09 LAB — POCT CBC
Granulocyte percent: 64 %G (ref 37–80)
HCT, POC: 35.5 % — AB (ref 37.7–47.9)
HEMOGLOBIN: 10.8 g/dL — AB (ref 12.2–16.2)
LYMPH, POC: 1.8 (ref 0.6–3.4)
MCH: 24.5 pg — AB (ref 27–31.2)
MCHC: 30.4 g/dL — AB (ref 31.8–35.4)
MCV: 80.4 fL (ref 80–97)
MID (CBC): 0.6 (ref 0–0.9)
MPV: 7.5 fL (ref 0–99.8)
PLATELET COUNT, POC: 376 10*3/uL (ref 142–424)
POC GRANULOCYTE: 4.2 (ref 2–6.9)
POC LYMPH %: 27.3 % (ref 10–50)
POC MID %: 8.7 % (ref 0–12)
RBC: 4.41 M/uL (ref 4.04–5.48)
RDW, POC: 18.2 %
WBC: 6.6 10*3/uL (ref 4.6–10.2)

## 2013-12-09 MED ORDER — HYDROCHLOROTHIAZIDE 12.5 MG PO CAPS: 12.5000 mg | ORAL_CAPSULE | Freq: Every day | ORAL | Status: DC

## 2013-12-09 MED ORDER — FERROUS SULFATE 325 (65 FE) MG PO TABS: 325.0000 mg | ORAL_TABLET | Freq: Every day | ORAL | Status: DC

## 2013-12-09 NOTE — Progress Notes (Addendum)
Subjective:    Patient ID: Jocelyn [name] Astle, female    DOB: November 20, 1965, 48 y.o.   MRN: [id number] This chart was scribed for Ellamae Siaobert Doolittle, MD by Nicholos Johnsenise Iheanachor, Medical Scribe. This patient's care was started at 8:08 AM.  Hypertension Pertinent negatives include no chest pain, headaches, palpitations or shortness of breath.   HPI Comments: Jocelyn [name] Carder is a 48 y.o. female who presents to the [facility] for a BP check. Pt states she went to the Bariatric clinic on 1/26 and BP was noted at 162/90. Pt was also told she was also slightly anemic. Hemoglobin was 11.5 at that visit. Pt believes her younger sister is a carrier of the sickle cell trait; they have different fathers and her mother is not a carrier. Pt does not have a family hx of HTN. Pt states bleeding during periods are pretty regular. Pt had fibroids removed in 2005 and was diagnosed as anemic at that point; was put on iron pills and things returned to normal soon after. Pt is planning to enroll in a weight loss program; states she has steadily gained weight over the years. Reports no cardiac trouble she is aware of. Denies trouble sleeping, fatigue, or SOB. Pt works as a Social workerclaims agent.  smoker Review of Systems  Constitutional: Negative for fever, activity change, appetite change, fatigue and unexpected weight change.  HENT: Negative for congestion, hearing loss and tinnitus.   Eyes: Negative for photophobia and visual disturbance.  Respiratory: Negative for cough and shortness of breath.   Cardiovascular: Negative for chest pain, palpitations and leg swelling.  Gastrointestinal: Negative for abdominal pain.  Genitourinary: Negative for difficulty urinating.  Neurological: Negative for weakness and headaches.  Psychiatric/Behavioral: Negative for sleep disturbance.    Objective:  Physical Exam  Vitals reviewed. Constitutional: She is oriented to person, place, and time. She appears well-developed  and well-nourished. No distress.  HENT:  Head: Normocephalic and atraumatic.  Eyes: EOM are normal. Pupils are equal, round, and reactive to light.  Neck: Neck supple. No thyromegaly present.  Cardiovascular: Normal rate, regular rhythm and normal heart sounds.  Exam reveals no gallop and no friction rub.   No murmur heard. Pulmonary/Chest: Effort normal and breath sounds normal. No respiratory distress. She has no wheezes. She has no rales.  Musculoskeletal: Normal range of motion. She exhibits no edema.  Lymphadenopathy:    She has no cervical adenopathy.  Neurological: She is alert and oriented to person, place, and time.  Skin: Skin is warm and dry.  Psychiatric: She has a normal mood and affect. Her behavior is normal.   Results for orders placed in visit on 12/09/13  POCT CBC      Result Value Range   WBC 6.6  4.6 - 10.2 K/uL   Lymph, poc 1.8  0.6 - 3.4   POC LYMPH PERCENT 27.3  10 - 50 %L   MID (cbc) 0.6  0 - 0.9   POC MID % 8.7  0 - 12 %M   POC Granulocyte 4.2  2 - 6.9   Granulocyte percent 64.0  37 - 80 %G   RBC 4.41  4.04 - 5.48 M/uL   Hemoglobin 10.8 (*) 12.2 - 16.2 g/dL   HCT, POC 14.735.5 (*) 82.937.7 - 47.9 %   MCV 80.4  80 - 97 fL   MCH, POC 24.5 (*) 27 - 31.2 pg   MCHC 30.4 (*) 31.8 - 35.4 g/dL   RDW, POC 18.2  Platelet Count, POC 376  142 - 424 K/uL   MPV 7.5  0 - 99.8 fL   Assessment & Plan:  Pt will have blood work to check and see if her iron levels are low. Lab work done: 16109 which she presents to US reveals a normal metabolic profile, normal lipid profile except HDL of 33, a hemoglobin of 11.5 with microcytic indices.  I have completed the patient encounter in its entirety as documented by the scribe, with editing by me where necessary. [name] P. Merla Riches, M.D. Microcytic anemia - Plan: POCT CBC, Iron and TIBC  HTN (hypertension) - Plan: hydrochlorothiazide (MICROZIDE) 12.5 MG capsule  Osesity---has plan w/ bar ctr   Needs f/u to disc smoking, check  for hypertension control, check for other parameters of health maintenance like immunizations  Meds ordered this encounter  Medications  . hydrochlorothiazide (MICROZIDE) 12.5 MG capsule    Sig: Take 1 capsule (12.5 mg total) by mouth daily.    Dispense:  90 capsule    Refill:  3  . ferrous sulfate (FEOSOL) 325 (65 FE) MG tablet    Sig: Take 1 tablet (325 mg total) by mouth daily with breakfast.    Dispense:  90 tablet    Refill:  3

## 2013-12-12 ENCOUNTER — Encounter: Payer: Self-pay | Admitting: Internal Medicine

## 2015-09-21 ENCOUNTER — Ambulatory Visit: Payer: Self-pay

## 2015-09-23 ENCOUNTER — Encounter (HOSPITAL_COMMUNITY): Payer: Self-pay | Admitting: Emergency Medicine

## 2015-09-23 ENCOUNTER — Emergency Department (HOSPITAL_COMMUNITY): Payer: 59

## 2015-09-23 ENCOUNTER — Inpatient Hospital Stay (HOSPITAL_COMMUNITY)
Admission: EM | Admit: 2015-09-23 | Discharge: 2015-09-26 | DRG: 193 | Disposition: A | Discharge status: Home / Self Care | Payer: 59 | Attending: Internal Medicine | Admitting: Internal Medicine

## 2015-09-23 DIAGNOSIS — D62 Acute posthemorrhagic anemia: Secondary | ICD-10-CM | POA: Diagnosis not present

## 2015-09-23 DIAGNOSIS — D509 Iron deficiency anemia, unspecified: Secondary | ICD-10-CM | POA: Diagnosis not present

## 2015-09-23 DIAGNOSIS — I10 Essential (primary) hypertension: Secondary | ICD-10-CM | POA: Diagnosis present

## 2015-09-23 DIAGNOSIS — E876 Hypokalemia: Secondary | ICD-10-CM | POA: Diagnosis present

## 2015-09-23 DIAGNOSIS — R0602 Shortness of breath: Secondary | ICD-10-CM | POA: Diagnosis present

## 2015-09-23 DIAGNOSIS — J189 Pneumonia, unspecified organism: Principal | ICD-10-CM | POA: Diagnosis present

## 2015-09-23 DIAGNOSIS — J9601 Acute respiratory failure with hypoxia: Secondary | ICD-10-CM | POA: Diagnosis present

## 2015-09-23 DIAGNOSIS — D508 Other iron deficiency anemias: Secondary | ICD-10-CM

## 2015-09-23 DIAGNOSIS — Z720 Tobacco use: Secondary | ICD-10-CM | POA: Diagnosis not present

## 2015-09-23 DIAGNOSIS — D500 Iron deficiency anemia secondary to blood loss (chronic): Secondary | ICD-10-CM | POA: Diagnosis present

## 2015-09-23 DIAGNOSIS — F17210 Nicotine dependence, cigarettes, uncomplicated: Secondary | ICD-10-CM | POA: Diagnosis present

## 2015-09-23 HISTORY — DX: Essential (primary) hypertension: I10

## 2015-09-23 HISTORY — DX: Anemia, unspecified: D64.9

## 2015-09-23 LAB — CBC WITH DIFFERENTIAL/PLATELET
Basophils Absolute: 0 10*3/uL (ref 0.0–0.1)
Basophils Relative: 0 %
EOS PCT: 3 %
Eosinophils Absolute: 0.4 10*3/uL (ref 0.0–0.7)
HEMATOCRIT: 27 % — AB (ref 36.0–46.0)
Hemoglobin: 7.2 g/dL — ABNORMAL LOW (ref 12.0–15.0)
LYMPHS PCT: 16 %
Lymphs Abs: 2.2 10*3/uL (ref 0.7–4.0)
MCH: 16 pg — ABNORMAL LOW (ref 26.0–34.0)
MCHC: 26.7 g/dL — AB (ref 30.0–36.0)
MCV: 60 fL — ABNORMAL LOW (ref 78.0–100.0)
MONOS PCT: 11 %
Monocytes Absolute: 1.5 10*3/uL — ABNORMAL HIGH (ref 0.1–1.0)
NEUTROS PCT: 70 %
Neutro Abs: 9.8 10*3/uL — ABNORMAL HIGH (ref 1.7–7.7)
PLATELETS: 501 10*3/uL — AB (ref 150–400)
RBC: 4.5 MIL/uL (ref 3.87–5.11)
RDW: 21.3 % — AB (ref 11.5–15.5)
WBC: 13.9 10*3/uL — AB (ref 4.0–10.5)

## 2015-09-23 LAB — BASIC METABOLIC PANEL
ANION GAP: 11 (ref 5–15)
BUN: 6 mg/dL (ref 6–20)
CHLORIDE: 102 mmol/L (ref 101–111)
CO2: 26 mmol/L (ref 22–32)
Calcium: 8.8 mg/dL — ABNORMAL LOW (ref 8.9–10.3)
Creatinine, Ser: 0.85 mg/dL (ref 0.44–1.00)
GFR calc non Af Amer: >60 mL/min (ref >60)
Glucose, Bld: 96 mg/dL (ref 65–99)
POTASSIUM: 3.3 mmol/L — AB (ref 3.5–5.1)
SODIUM: 139 mmol/L (ref 135–145)

## 2015-09-23 LAB — POC OCCULT BLOOD, ED: Fecal Occult Bld: NEGATIVE

## 2015-09-23 LAB — LIPASE, BLOOD: Lipase: 19 U/L (ref 11–51)

## 2015-09-23 LAB — ABO/RH: ABO/RH(D): B POS

## 2015-09-23 MED ORDER — ACETAMINOPHEN 650 MG RE SUPP: 650.0000 mg | Freq: Four times a day (QID) | RECTAL | Status: DC | PRN

## 2015-09-23 MED ORDER — DEXTROSE 5 % IV SOLN
500.0000 mg | Freq: Once | INTRAVENOUS | Status: AC
  Administered 2015-09-23: 500 mg via INTRAVENOUS
  Filled 2015-09-23: qty 500

## 2015-09-23 MED ORDER — SODIUM CHLORIDE 0.9 % IV SOLN
INTRAVENOUS | Status: AC
  Administered 2015-09-23 – 2015-09-24 (×2): via INTRAVENOUS

## 2015-09-23 MED ORDER — ONDANSETRON HCL 4 MG/2ML IJ SOLN: 4.0000 mg | Freq: Four times a day (QID) | INTRAMUSCULAR | Status: DC | PRN

## 2015-09-23 MED ORDER — GUAIFENESIN 100 MG/5ML PO SOLN
200.0000 mg | Freq: Three times a day (TID) | ORAL | Status: DC | PRN
  Administered 2015-09-23 – 2015-09-25 (×3): 200 mg via ORAL
  Filled 2015-09-23 (×3): qty 10

## 2015-09-23 MED ORDER — ACETAMINOPHEN 325 MG PO TABS
650.0000 mg | ORAL_TABLET | Freq: Four times a day (QID) | ORAL | Status: DC | PRN
  Administered 2015-09-24 – 2015-09-25 (×2): 650 mg via ORAL
  Filled 2015-09-23 (×2): qty 2

## 2015-09-23 MED ORDER — ENOXAPARIN SODIUM 40 MG/0.4ML ~~LOC~~ SOLN
40.0000 mg | Freq: Every day | SUBCUTANEOUS | Status: DC
  Administered 2015-09-23: 40 mg via SUBCUTANEOUS
  Filled 2015-09-23 (×2): qty 0.4

## 2015-09-23 MED ORDER — ALBUTEROL SULFATE (2.5 MG/3ML) 0.083% IN NEBU
2.5000 mg | INHALATION_SOLUTION | RESPIRATORY_TRACT | Status: DC | PRN
  Administered 2015-09-24 (×2): 2.5 mg via RESPIRATORY_TRACT
  Filled 2015-09-23 (×2): qty 3

## 2015-09-23 MED ORDER — SODIUM CHLORIDE 0.9 % IV SOLN
1000.0000 mL | Freq: Once | INTRAVENOUS | Status: AC
Start: 2015-09-23 — End: 2015-09-23
  Administered 2015-09-23: 1000 mL via INTRAVENOUS

## 2015-09-23 MED ORDER — AZITHROMYCIN 500 MG PO TABS
500.0000 mg | ORAL_TABLET | ORAL | Status: DC
  Administered 2015-09-24 – 2015-09-25 (×2): 500 mg via ORAL
  Filled 2015-09-23 (×3): qty 1

## 2015-09-23 MED ORDER — ACETAMINOPHEN 325 MG PO TABS
650.0000 mg | ORAL_TABLET | Freq: Once | ORAL | Status: AC
  Administered 2015-09-23: 650 mg via ORAL
  Filled 2015-09-23: qty 2

## 2015-09-23 MED ORDER — SODIUM CHLORIDE 0.9 % IV SOLN
1000.0000 mL | INTRAVENOUS | Status: DC
  Administered 2015-09-23: 1000 mL via INTRAVENOUS

## 2015-09-23 MED ORDER — DEXTROSE 5 % IV SOLN
2.0000 g | INTRAVENOUS | Status: DC
  Administered 2015-09-24 – 2015-09-26 (×3): 2 g via INTRAVENOUS
  Filled 2015-09-23 (×3): qty 2

## 2015-09-23 MED ORDER — DEXTROSE 5 % IV SOLN
1.0000 g | Freq: Once | INTRAVENOUS | Status: AC
  Administered 2015-09-23: 1 g via INTRAVENOUS
  Filled 2015-09-23: qty 10

## 2015-09-23 MED ORDER — IPRATROPIUM-ALBUTEROL 0.5-2.5 (3) MG/3ML IN SOLN
3.0000 mL | Freq: Once | RESPIRATORY_TRACT | Status: AC
  Administered 2015-09-23: 3 mL via RESPIRATORY_TRACT
  Filled 2015-09-23: qty 3

## 2015-09-23 MED ORDER — ONDANSETRON HCL 4 MG PO TABS: 4.0000 mg | ORAL_TABLET | Freq: Four times a day (QID) | ORAL | Status: DC | PRN

## 2015-09-23 NOTE — ED Notes (Signed)
Doctor was informed of patient's rectal temperature

## 2015-09-23 NOTE — ED Notes (Signed)
Patient transported to X-ray 

## 2015-09-23 NOTE — H&P (Signed)
[facility] History and Physical  Jocelyn [name] [id number] DOB: [birth year] DOA: 09/23/2015  Referring physician: Ms. Keenan BachelorSofia. PCP: No PCP Per Patient [place] [facility]. Specialists: None.  Chief Complaint: Shortness of breath.  HPI: Jocelyn [name] is a 49 y.o. female with history of ongoing tobacco abuse, chronic anemia present severe because of worsening shortness of breath with cough and productive sputum. Patient also at times had nausea vomiting after coughing. Denies abdominal pain. Denies any chest pain. In the ER patient was found to be having fever and blood work shows leukocytosis. Chest x-ray showed multifocal pneumonia and patient has been admitted for further management of community-acquired pneumonia. Patient denies any recent travel or sick contacts. Patient on exam was not having any abdominal pain. Patient's vomiting is only after both of cough. Patient has had previous history of hypertension and has been off antihypertensives after patient last week. Patient is also found to be having hypochromic microcytic anemia and stool. Jocelyn [name] MaduroRobert has been negative. Patient states she has had previous episodes of menorrhagia which has stopped after fibroid removal.  Review of Systems: As presented in the history of presenting illness, rest negative.  Past Medical History  Diagnosis Date  . Hypertension   . Anemia    Past Surgical History  Procedure Laterality Date  . Myomectomy     Social History:  reports that she has been smoking Cigarettes.  She has a 2 pack-year smoking history. She does not have any smokeless tobacco history on file. She reports that she drinks alcohol. She reports that she does not use illicit drugs. Where does patient live at home. Can patient participate in ADLs? Yes.  No Known Allergies  Family History:  Family History  Problem Relation Age of Onset  . Diabetes Mellitus II Neg Hx   . Hypertension Neg Hx       Prior to Admission  medications   Medication Sig Start Date End Date Taking? Authorizing Provider  acetaminophen (TYLENOL) 500 MG tablet Take 500 mg by mouth every 6 (six) hours as needed for mild pain, moderate pain, fever or headache.   Yes Historical Provider, MD  dextromethorphan (DELSYM) 30 MG/5ML liquid Take 60 mg by mouth every 12 (twelve) hours as needed for cough.   Yes Historical Provider, MD  dextromethorphan-guaiFENesin (MUCINEX DM) 30-600 MG 12hr tablet Take 1 tablet by mouth once.   Yes Historical Provider, MD  guaifenesin (ROBITUSSIN) 100 MG/5ML syrup Take 200 mg by mouth 3 (three) times daily as needed for cough or congestion.   Yes Historical Provider, MD  ferrous sulfate (FEOSOL) 325 (65 FE) MG tablet Take 1 tablet (325 mg total) by mouth daily with breakfast. Patient not taking: Reported on 09/23/2015 12/09/13   Tonye Pearsonobert P Doolittle, MD  hydrochlorothiazide (MICROZIDE) 12.5 MG capsule Take 1 capsule (12.5 mg total) by mouth daily. Patient not taking: Reported on 09/23/2015 12/09/13   Tonye Pearsonobert P Doolittle, MD    Physical Exam: Filed Vitals:   09/23/15 1722 09/23/15 2024 09/23/15 2032  BP: 166/91 147/82   Pulse: 107 107   Temp: 98.7 F (37.1 C) 100.1 F (37.8 C) 99.9 F (37.7 C)  TempSrc: Oral  Rectal  Resp: 20 38   Weight: 97.523 kg (215 lb)    SpO2: 92% 92%      General:  Moderately built and nourished.  Eyes: Anicteric mild pallor.  ENT: No discharge from the ears eyes nose and mouth.  Neck: No mass felt.  Cardiovascular: S1 and S2 heard.  Respiratory: No  rhonchi or crepitations.  Abdomen: Soft nontender bowel sounds present. No guarding or rigidity.  Skin: No rash.  Musculoskeletal: No edema.  Psychiatric: Appears normal.  Neurologic: Alert awake oriented to time place and person. Moves all extremities.  Labs on Admission:  Basic Metabolic Panel:  Recent Labs Lab 09/23/15 1844  NA 139  K 3.3*  CL 102  CO2 26  GLUCOSE 96  BUN 6  CREATININE 0.85  CALCIUM 8.8*    Liver Function Tests: No results for input(s): AST, ALT, ALKPHOS, BILITOT, PROT, ALBUMIN in the last 168 hours.  Recent Labs Lab 09/23/15 2011  LIPASE 19   No results for input(s): AMMONIA in the last 168 hours. CBC:  Recent Labs Lab 09/23/15 1844  WBC 13.9*  NEUTROABS 9.8*  HGB 7.2*  HCT 27.0*  MCV 60.0*  PLT 501*   Cardiac Enzymes: No results for input(s): CKTOTAL, CKMB, CKMBINDEX, TROPONINI in the last 168 hours.  BNP (last 3 results) No results for input(s): BNP in the last 8760 hours.  ProBNP (last 3 results) No results for input(s): PROBNP in the last 8760 hours.  CBG: No results for input(s): GLUCAP in the last 168 hours.  Radiological Exams on Admission: Dg Chest 2 View  09/23/2015  CLINICAL DATA:  Persistent cough x 3 days - chills - generalized body aches - nonsmoker - pt states no other chest hx EXAM: CHEST - 2 VIEW COMPARISON:  None available FINDINGS: Subsegmental airspace consolidation in anterior and posterior segments right upper lobe. Patchy airspace opacities in the lingula and left lower lobe. Heart size normal. No effusion.  No pneumothorax. Visualized skeletal structures are unremarkable. IMPRESSION: 1. Asymmetric airspace disease as above suggesting multifocal pneumonia. Electronically Signed   By: Corlis Leak M.D.   On: 09/23/2015 18:03     Assessment/Plan Principal Problem:   Pneumonia, community acquired Active Problems:   Microcytic hypochromic anemia   Tobacco abuse   Pneumonia   1. Community-acquired pneumonia - patient has been placed on ceftriaxone and Zithromax. Check influenza PCR urine for strep and legionella antigen and HIV status. Continue with gentle hydration. Closely observe respiratory status as chest x-ray shows multifocal pneumonia. 2. Microcytic hypochromic anemia - probably secondary to iron deficiency. Anemia panel is pending. Stool for occult blood has been negative. We'll start iron therapy if anemia panel shows low  iron levels. Further workup as outpatient. Follow CBC. There is any further decline may need transfusion. 3. Tobacco abuse - patient advised to quit smoking. 4. Mild hypokalemia probably from vomiting - replace and recheck. Check magnesium levels. 5. Vomiting spelled by coughing spells - check LFTs. Abdomen appears benign.  I have reviewed patient's old charts and labs. Personally reviewed chest x-ray.   DVT Prophylaxis Lovenox.  Code Status: Full code.  Family Communication: Patient's husband.  Disposition Plan: Admit to inpatient.    Phelicia Dantes N. [facility] Pager [phone].  If 7PM-7AM, please contact night-coverage [email or web address] Password [facility] 09/23/2015, 10:39 PM

## 2015-09-23 NOTE — ED Notes (Signed)
Applied 2L O2 due to O2 sat at 88% on RA.

## 2015-09-23 NOTE — ED Notes (Signed)
Pt c/o generalized body aches, fever, and emesis since Friday. Pt also c/o dry cough that makes her feel winded. A&Ox4 and ambulatory. NAD noted. Denies chest pain. Denies sore throat. Pt also c/o sharp headache in R temple.

## 2015-09-23 NOTE — ED Notes (Signed)
Pt ambulated to bathroom off O2. Pt's O2 was 82% on rm air when pt got back to room. Pt placed back on 3L of oxygen.

## 2015-09-23 NOTE — ED Notes (Signed)
Food given to patient.  

## 2015-09-23 NOTE — Progress Notes (Signed)
Patient listed as having [employer] insurance without a pcp.  EDCM spoke to patient at bedside.  Patient reports she goes to the [facility] on [address] for her medical needs.  She does not see any physician there in particular.

## 2015-09-23 NOTE — ED Provider Notes (Signed)
CSN: [id number]     Arrival date & time 09/23/15  1714 History   First MD Initiated Contact with Patient 09/23/15 1757     Chief Complaint  Patient presents with  . Generalized Body Aches  . Emesis  . Fever     (Consider location/radiation/quality/duration/timing/severity/associated sxs/prior Treatment) Patient is a 49 y.o. female presenting with fever and cough. The history is provided by the patient. No language interpreter was used.  Fever Associated symptoms: cough   Cough Cough characteristics:  Productive Sputum characteristics:  Green Severity:  Moderate Onset quality:  Gradual Duration:  3 days Timing:  Constant Progression:  Worsening Chronicity:  New Smoker: no   Context: not sick contacts   Relieved by:  Nothing Worsened by:  Nothing tried Ineffective treatments:  None tried Associated symptoms: fever and shortness of breath   Risk factors: no recent infection     History reviewed. No pertinent past medical history. Past Surgical History  Procedure Laterality Date  . Myomectomy     No family history on file. Social History  Substance Use Topics  . Smoking status: Current Some Day Smoker -- 0.40 packs/day for 5 years    Types: Cigarettes  . Smokeless tobacco: None  . Alcohol Use: Yes   OB History    No data available     Review of Systems  Constitutional: Positive for fever.  Respiratory: Positive for cough and shortness of breath.   All other systems reviewed and are negative.     Allergies  Review of patient's allergies indicates no known allergies.  Home Medications   Prior to Admission medications   Medication Sig Start Date End Date Taking? Authorizing Provider  ferrous sulfate (FEOSOL) 325 (65 FE) MG tablet Take 1 tablet (325 mg total) by mouth daily with breakfast. 12/09/13   Tonye Pearson, MD  hydrochlorothiazide (MICROZIDE) 12.5 MG capsule Take 1 capsule (12.5 mg total) by mouth daily. 12/09/13   Tonye Pearson, MD   BP  166/91 mmHg  Pulse 107  Temp(Src) 98.7 F (37.1 C) (Oral)  Resp 20  Wt 215 lb (97.523 kg)  SpO2 92%  LMP 08/23/2015 Physical Exam  Constitutional: She appears well-developed and well-nourished.  HENT:  Head: Normocephalic and atraumatic.  Right Ear: External ear normal.  Left Ear: External ear normal.  Mouth/Throat: Oropharynx is clear and moist.  Eyes: Conjunctivae are normal. Pupils are equal, round, and reactive to light.  Neck: Normal range of motion.  Cardiovascular:  tachycardia  Pulmonary/Chest: She has wheezes.  Abdominal: Soft.  Musculoskeletal: Normal range of motion.  Nursing note and vitals reviewed.   ED Course  Procedures (including critical care time) Labs Review Labs Reviewed  BASIC METABOLIC PANEL  CBC WITH DIFFERENTIAL/PLATELET    Imaging Review Dg Chest 2 View  09/23/2015  CLINICAL DATA:  Persistent cough x 3 days - chills - generalized body aches - nonsmoker - pt states no other chest hx EXAM: CHEST - 2 VIEW COMPARISON:  None available FINDINGS: Subsegmental airspace consolidation in anterior and posterior segments right upper lobe. Patchy airspace opacities in the lingula and left lower lobe. Heart size normal. No effusion.  No pneumothorax. Visualized skeletal structures are unremarkable. IMPRESSION: 1. Asymmetric airspace disease as above suggesting multifocal pneumonia. Electronically Signed   By: Corlis Leak M.D.   On: 09/23/2015 18:03   I have personally reviewed and evaluated these images and lab results as part of my medical decision-making.   EKG Interpretation None  MDM Pt had 02 sat of 88% on room air.  Pt reports she feels weak and short of breath.  Pt reports short of breath with exertion.  Pt given albuterol neb.   Chest xray shows multifocal pneumonia,   Rocephin and zithromax ordered.     Final diagnoses:  Community acquired pneumonia  Other iron deficiency anemias    I spoke with Dr. Gentry FitzKakarakandy who will admit,       Elson AreasLeslie K Sofia, PA-C 09/23/15 2037  Courteney Randall AnLyn Mackuen, MD 09/24/15 [phone]

## 2015-09-24 DIAGNOSIS — D62 Acute posthemorrhagic anemia: Secondary | ICD-10-CM

## 2015-09-24 DIAGNOSIS — J189 Pneumonia, unspecified organism: Principal | ICD-10-CM

## 2015-09-24 DIAGNOSIS — E876 Hypokalemia: Secondary | ICD-10-CM

## 2015-09-24 LAB — CBC WITH DIFFERENTIAL/PLATELET
BASOS PCT: 0 %
Basophils Absolute: 0 10*3/uL (ref 0.0–0.1)
Eosinophils Absolute: 0.6 10*3/uL (ref 0.0–0.7)
Eosinophils Relative: 4 %
HEMATOCRIT: 22.9 % — AB (ref 36.0–46.0)
Hemoglobin: 6.3 g/dL — CL (ref 12.0–15.0)
LYMPHS ABS: 2.6 10*3/uL (ref 0.7–4.0)
Lymphocytes Relative: 17 %
MCH: 16.4 pg — ABNORMAL LOW (ref 26.0–34.0)
MCHC: 27.5 g/dL — AB (ref 30.0–36.0)
MCV: 59.8 fL — AB (ref 78.0–100.0)
MONO ABS: 1.4 10*3/uL — AB (ref 0.1–1.0)
Monocytes Relative: 9 %
NEUTROS ABS: 10.8 10*3/uL — AB (ref 1.7–7.7)
NRBC: 1 /100{WBCs} — AB
Neutrophils Relative %: 70 %
Platelets: 447 10*3/uL — ABNORMAL HIGH (ref 150–400)
RBC: 3.83 MIL/uL — ABNORMAL LOW (ref 3.87–5.11)
RDW: 21.3 % — AB (ref 11.5–15.5)
WBC: 15.4 10*3/uL — ABNORMAL HIGH (ref 4.0–10.5)

## 2015-09-24 LAB — COMPREHENSIVE METABOLIC PANEL
ALBUMIN: 3.1 g/dL — AB (ref 3.5–5.0)
ALK PHOS: 96 U/L (ref 38–126)
ALT: 21 U/L (ref 14–54)
ANION GAP: 10 (ref 5–15)
AST: 24 U/L (ref 15–41)
BILIRUBIN TOTAL: 0.3 mg/dL (ref 0.3–1.2)
BUN: <5 mg/dL — ABNORMAL LOW (ref 6–20)
CALCIUM: 8 mg/dL — AB (ref 8.9–10.3)
CO2: 24 mmol/L (ref 22–32)
CREATININE: 0.74 mg/dL (ref 0.44–1.00)
Chloride: 105 mmol/L (ref 101–111)
GFR calc non Af Amer: >60 mL/min (ref >60)
GLUCOSE: 100 mg/dL — AB (ref 65–99)
Potassium: 3.3 mmol/L — ABNORMAL LOW (ref 3.5–5.1)
SODIUM: 139 mmol/L (ref 135–145)
Total Protein: 7.2 g/dL (ref 6.5–8.1)

## 2015-09-24 LAB — CBC
HCT: 26.2 % — ABNORMAL LOW (ref 36.0–46.0)
HEMOGLOBIN: 7.1 g/dL — AB (ref 12.0–15.0)
MCH: 16.4 pg — AB (ref 26.0–34.0)
MCHC: 27.1 g/dL — AB (ref 30.0–36.0)
MCV: 60.6 fL — ABNORMAL LOW (ref 78.0–100.0)
PLATELETS: 503 10*3/uL — AB (ref 150–400)
RBC: 4.32 MIL/uL (ref 3.87–5.11)
RDW: 21.3 % — ABNORMAL HIGH (ref 11.5–15.5)
WBC: 16.4 10*3/uL — ABNORMAL HIGH (ref 4.0–10.5)

## 2015-09-24 LAB — URINE MICROSCOPIC-ADD ON: BACTERIA UA: NONE SEEN

## 2015-09-24 LAB — URINALYSIS, ROUTINE W REFLEX MICROSCOPIC
Bilirubin Urine: NEGATIVE
GLUCOSE, UA: NEGATIVE mg/dL
Ketones, ur: NEGATIVE mg/dL
LEUKOCYTES UA: NEGATIVE
NITRITE: NEGATIVE
PH: 6.5 (ref 5.0–8.0)
PROTEIN: NEGATIVE mg/dL
Specific Gravity, Urine: 1.01 (ref 1.005–1.030)

## 2015-09-24 LAB — CREATININE, SERUM
CREATININE: 0.85 mg/dL (ref 0.44–1.00)
GFR calc Af Amer: >60 mL/min (ref >60)

## 2015-09-24 LAB — RETICULOCYTES
RBC.: 4.32 MIL/uL (ref 3.87–5.11)
RETIC COUNT ABSOLUTE: 30.2 10*3/uL (ref 19.0–186.0)
Retic Ct Pct: 0.7 % (ref 0.4–3.1)

## 2015-09-24 LAB — IRON AND TIBC
IRON: 10 ug/dL — AB (ref 28–170)
Saturation Ratios: 3 % — ABNORMAL LOW (ref 10.4–31.8)
TIBC: 301 ug/dL (ref 250–450)
UIBC: 291 ug/dL

## 2015-09-24 LAB — FERRITIN: FERRITIN: 40 ng/mL (ref 11–307)

## 2015-09-24 LAB — INFLUENZA PANEL BY PCR (TYPE A & B)
H1N1 flu by pcr: NOT DETECTED
Influenza A By PCR: NEGATIVE
Influenza B By PCR: NEGATIVE

## 2015-09-24 LAB — PREPARE RBC (CROSSMATCH)

## 2015-09-24 LAB — HIV ANTIBODY (ROUTINE TESTING W REFLEX): HIV SCREEN 4TH GENERATION: NONREACTIVE

## 2015-09-24 LAB — MAGNESIUM: Magnesium: 1.9 mg/dL (ref 1.7–2.4)

## 2015-09-24 LAB — BILIRUBIN, DIRECT: Bilirubin, Direct: <0.1 mg/dL — ABNORMAL LOW (ref 0.1–0.5)

## 2015-09-24 LAB — VITAMIN B12: Vitamin B-12: 321 pg/mL (ref 180–914)

## 2015-09-24 LAB — STREP PNEUMONIAE URINARY ANTIGEN: STREP PNEUMO URINARY ANTIGEN: NEGATIVE

## 2015-09-24 LAB — FOLATE: Folate: 11.3 ng/mL (ref >5.9)

## 2015-09-24 MED ORDER — SODIUM CHLORIDE 0.9 % IV SOLN
Freq: Once | INTRAVENOUS | Status: AC
  Administered 2015-09-24: 09:00:00 via INTRAVENOUS

## 2015-09-24 MED ORDER — IPRATROPIUM-ALBUTEROL 0.5-2.5 (3) MG/3ML IN SOLN: 3.0000 mL | Freq: Three times a day (TID) | RESPIRATORY_TRACT | Status: DC

## 2015-09-24 MED ORDER — GUAIFENESIN ER 600 MG PO TB12
600.0000 mg | ORAL_TABLET | Freq: Two times a day (BID) | ORAL | Status: DC
  Administered 2015-09-24 – 2015-09-26 (×5): 600 mg via ORAL
  Filled 2015-09-24 (×6): qty 1

## 2015-09-24 NOTE — Care Management Note (Signed)
Case Management Note  Patient Details  Name: Harvest ForestMickie Currington MRN: [id number] Date of Birth: 09/27/1966  Subjective/Objective: 49 y/o f admitted w/PNA, anemia. From home. Will offer pcp listing as resource.                   Action/Plan:d/c plan home.   Expected Discharge Date:   (Unknown)               Expected Discharge Plan:  Home/Self Care  In-House Referral:     Discharge planning Services  CM Consult  Post Acute Care Choice:    Choice offered to:     DME Arranged:    DME Agency:     HH Arranged:    HH Agency:     Status of Service:  In process, will continue to follow  Medicare Important Message Given:    Date Medicare IM Given:    Medicare IM give by:    Date Additional Medicare IM Given:    Additional Medicare Important Message give by:     If discussed at Long Length of Stay Meetings, dates discussed:    Additional Comments:  Lanier ClamMahabir, Maysen Sudol, RN 09/24/2015, 2:12 PM

## 2015-09-24 NOTE — Progress Notes (Signed)
CRITICAL VALUE ALERT  Critical value received:  Hgb 6.3  Date of notification:  09/24/15  Time of notification:  0608  Critical value read back:Yes.    Nurse who received alert:  Lucillie Garfinkel Lorenzo Arscott RN  MD notified (1st page):  TRH1  Time of first page: [phone]  MD notified (2nd page):  Time of second page:  Responding MD:   Time MD responded:

## 2015-09-24 NOTE — Care Management Note (Signed)
Case Management Note  Patient Details  Name: Jocelyn [name] MRN: [id number] Date of Birth: [birth year]  Subjective/Objective: Provided patient w/pcp listing resource.  Noted patient on 02. If home 02 needed can arrange w/qualifying 02 sats documented, & home 02 order.                   Action/Plan:d/c plan home.   Expected Discharge Date:   (Unknown)               Expected Discharge Plan:  Home/Self Care  In-House Referral:     Discharge planning Services  CM Consult  Post Acute Care Choice:    Choice offered to:     DME Arranged:    DME Agency:     HH Arranged:    HH Agency:     Status of Service:  In process, will continue to follow  Medicare Important Message Given:    Date Medicare IM Given:    Medicare IM give by:    Date Additional Medicare IM Given:    Additional Medicare Important Message give by:     If discussed at Long Length of Stay Meetings, dates discussed:    Additional Comments:  Lanier ClamMahabir, Michiah Mudry, RN 09/24/2015, 3:09 PM

## 2015-09-24 NOTE — Progress Notes (Signed)
PROGRESS NOTE  Harvest ForestMickie [name] [id number] DOB: 1966-06-23 DOA: 09/23/2015 PCP: No PCP Per Patient  HPI/Recap of past 24 hours:  Feeling better, getting blood transfusion, still cough, nonproductive, denies chest pain, on 3liter oxygen  Assessment/Plan: Principal Problem:   Pneumonia, community acquired Active Problems:   Microcytic hypochromic anemia   Tobacco abuse   Pneumonia  Bilateral multifocal pneumonia with hypoxic respiratory failure: on rocephin/zitrho, oxygen supplement,  Blood loss anemia: reported heavy mensis, prbc transfusion, need iron supplement, gyn follow up  HTN: patient not taking any meds at home, need close pmd follow up  Hypokalemia; replace k, mag 1.9  Code Status: full  Family Communication: patient   Disposition Plan: home when medically stable, likely in 2-3 days   Consultants:  none  Procedures:  none  Antibiotics:  Rocephin/zithro   Objective: BP 153/89 mmHg  Pulse 94  Temp(Src) 99 F (37.2 C) (Oral)  Resp 24  Ht 5\' 7"  (1.702 m)  Wt 201 lb 1 oz (91.2 kg)  BMI 31.48 kg/m2  SpO2 99%  LMP 08/23/2015  Intake/Output Summary (Last 24 hours) at 09/24/15 1019 Last data filed at 09/24/15 0925  Gross per 24 hour  Intake 1815.42 ml  Output   1200 ml  Net 615.42 ml   Filed Weights   09/23/15 1722 09/23/15 2242  Weight: 215 lb (97.523 kg) 201 lb 1 oz (91.2 kg)    Exam:   General:  NAD  Cardiovascular: RRR  Respiratory: coarse bialterally  Abdomen: Soft/ND/NT, positive BS  Musculoskeletal: No Edema  Neuro: aaox3  Data Reviewed: Basic Metabolic Panel:  Recent Labs Lab 09/23/15 1844 09/23/15 2345 09/24/15 0525  NA 139  --  139  K 3.3*  --  3.3*  CL 102  --  105  CO2 26  --  24  GLUCOSE 96  --  100*  BUN 6  --  <5*  CREATININE 0.85 0.85 0.74  CALCIUM 8.8*  --  8.0*  MG  --   --  1.9   Liver Function Tests:  Recent Labs Lab 09/24/15 0525  AST 24  ALT 21  ALKPHOS 96  BILITOT 0.3  PROT 7.2    ALBUMIN 3.1*    Recent Labs Lab 09/23/15 2011  LIPASE 19   No results for input(s): AMMONIA in the last 168 hours. CBC:  Recent Labs Lab 09/23/15 1844 09/23/15 2345 09/24/15 0525  WBC 13.9* 16.4* 15.4*  NEUTROABS 9.8*  --  10.8*  HGB 7.2* 7.1* 6.3*  HCT 27.0* 26.2* 22.9*  MCV 60.0* 60.6* 59.8*  PLT 501* 503* 447*   Cardiac Enzymes:   No results for input(s): CKTOTAL, CKMB, CKMBINDEX, TROPONINI in the last 168 hours. BNP (last 3 results) No results for input(s): BNP in the last 8760 hours.  ProBNP (last 3 results) No results for input(s): PROBNP in the last 8760 hours.  CBG: No results for input(s): GLUCAP in the last 168 hours.  No results found for this or any previous visit (from the past 240 hour(s)).   Studies: Dg Chest 2 View  09/23/2015  CLINICAL DATA:  Persistent cough x 3 days - chills - generalized body aches - nonsmoker - pt states no other chest hx EXAM: CHEST - 2 VIEW COMPARISON:  None available FINDINGS: Subsegmental airspace consolidation in anterior and posterior segments right upper lobe. Patchy airspace opacities in the lingula and left lower lobe. Heart size normal. No effusion.  No pneumothorax. Visualized skeletal structures are unremarkable. IMPRESSION: 1. Asymmetric airspace disease  as above suggesting multifocal pneumonia. Electronically Signed   By: Corlis Leak M.D.   On: 09/23/2015 18:03    Scheduled Meds: . azithromycin  500 mg Oral Q24H  . cefTRIAXone (ROCEPHIN)  IV  2 g Intravenous Q24H  . guaiFENesin  600 mg Oral BID  . ipratropium-albuterol  3 mL Nebulization 3 times per day    Continuous Infusions: . sodium chloride Stopped (09/24/15 0830)     Time spent: 35 mins  Ardis Lawley MD, PhD  [facility] Pager [phone]. If 7PM-7AM, please contact night-coverage at [email or web address], password [facility] 09/24/2015, 10:19 AM  LOS: 1 day

## 2015-09-24 NOTE — Progress Notes (Signed)
Patient instructed to to talk with her primary care doctor regarding her high blood pressure to help BP to be within a healthy range. Patient verbalizes understanding.

## 2015-09-25 DIAGNOSIS — D509 Iron deficiency anemia, unspecified: Secondary | ICD-10-CM

## 2015-09-25 LAB — TYPE AND SCREEN
ABO/RH(D): B POS
ANTIBODY SCREEN: NEGATIVE
UNIT DIVISION: 0

## 2015-09-25 LAB — BASIC METABOLIC PANEL
Anion gap: 10 (ref 5–15)
CALCIUM: 8.5 mg/dL — AB (ref 8.9–10.3)
CHLORIDE: 105 mmol/L (ref 101–111)
CO2: 25 mmol/L (ref 22–32)
CREATININE: 0.68 mg/dL (ref 0.44–1.00)
Glucose, Bld: 98 mg/dL (ref 65–99)
Potassium: 3.5 mmol/L (ref 3.5–5.1)
SODIUM: 140 mmol/L (ref 135–145)

## 2015-09-25 LAB — CBC
HCT: 27.2 % — ABNORMAL LOW (ref 36.0–46.0)
HEMOGLOBIN: 7.6 g/dL — AB (ref 12.0–15.0)
MCH: 17.4 pg — AB (ref 26.0–34.0)
MCHC: 27.9 g/dL — AB (ref 30.0–36.0)
MCV: 62.2 fL — ABNORMAL LOW (ref 78.0–100.0)
PLATELETS: 522 10*3/uL — AB (ref 150–400)
RBC: 4.37 MIL/uL (ref 3.87–5.11)
RDW: 22.7 % — AB (ref 11.5–15.5)
WBC: 11.4 10*3/uL — ABNORMAL HIGH (ref 4.0–10.5)

## 2015-09-25 MED ORDER — BENZONATATE 100 MG PO CAPS
100.0000 mg | ORAL_CAPSULE | Freq: Three times a day (TID) | ORAL | Status: DC | PRN
  Administered 2015-09-25 – 2015-09-26 (×3): 100 mg via ORAL
  Filled 2015-09-25 (×3): qty 1

## 2015-09-25 MED ORDER — IPRATROPIUM-ALBUTEROL 0.5-2.5 (3) MG/3ML IN SOLN
3.0000 mL | Freq: Three times a day (TID) | RESPIRATORY_TRACT | Status: DC
  Filled 2015-09-25: qty 3

## 2015-09-25 MED ORDER — HYDROCHLOROTHIAZIDE 12.5 MG PO CAPS
12.5000 mg | ORAL_CAPSULE | Freq: Every day | ORAL | Status: DC
  Administered 2015-09-25 – 2015-09-26 (×2): 12.5 mg via ORAL
  Filled 2015-09-25 (×2): qty 1

## 2015-09-25 MED ORDER — METHYLPREDNISOLONE SODIUM SUCC 125 MG IJ SOLR
60.0000 mg | INTRAMUSCULAR | Status: DC
  Administered 2015-09-25: 60 mg via INTRAVENOUS
  Filled 2015-09-25 (×2): qty 0.96

## 2015-09-25 MED ORDER — FERROUS SULFATE 325 (65 FE) MG PO TABS
325.0000 mg | ORAL_TABLET | Freq: Two times a day (BID) | ORAL | Status: DC
  Administered 2015-09-25 – 2015-09-26 (×2): 325 mg via ORAL
  Filled 2015-09-25 (×4): qty 1

## 2015-09-25 MED ORDER — HYDRALAZINE HCL 20 MG/ML IJ SOLN: 10.0000 mg | Freq: Four times a day (QID) | INTRAMUSCULAR | Status: DC | PRN

## 2015-09-25 MED ORDER — ALBUTEROL SULFATE (2.5 MG/3ML) 0.083% IN NEBU: 2.5000 mg | INHALATION_SOLUTION | Freq: Four times a day (QID) | RESPIRATORY_TRACT | Status: DC | PRN

## 2015-09-25 MED ORDER — IPRATROPIUM-ALBUTEROL 0.5-2.5 (3) MG/3ML IN SOLN
3.0000 mL | Freq: Three times a day (TID) | RESPIRATORY_TRACT | Status: DC
  Administered 2015-09-25 (×2): 3 mL via RESPIRATORY_TRACT
  Filled 2015-09-25 (×2): qty 3

## 2015-09-25 NOTE — Progress Notes (Addendum)
PROGRESS NOTE  Jocelyn [name] KVQ:[id number] DOB: 21-Apr-1966 DOA: 09/23/2015 PCP: No PCP Per Patient  HPI/Recap of past 24 hours:  Feeling better, off oxygen, still nonproductive cough, reported nebs helped, husband in room  Assessment/Plan: Principal Problem:   Pneumonia, community acquired Active Problems:   Microcytic hypochromic anemia   Tobacco abuse   Pneumonia  Bilateral multifocal pneumonia with acute hypoxic respiratory failure: on rocephin/zitrho, improving, off oxygen, but still significant coughing spells, will add tessalon pearls prn and solumedrol, continue mucinex, nebs, repeat chest imaging in cough persistent  Blood loss anemia: reported heavy mensis, s/p prbc transfusion, iron supplement started, gyn follow up  HTN: patient not taking any meds at home, home bp monitoring, need close pmd follow up  6;30pm Addendum: bp continue to be elevated, sbp 170's from steroids/fluids/stress? Restart home  bp meds hctz ( patient has not been taking it), add prn hydralazine.  Hypokalemia; replace k, mag 1.9  Code Status: full  Family Communication: patient and husband  Disposition Plan: home. likely 11/18, if continue to improve   Consultants:  none  Procedures:  none  Antibiotics:  Rocephin/zithro   Objective: BP 150/94 mmHg  Pulse 94  Temp(Src) 98.8 F (37.1 C) (Oral)  Resp 20  Ht  (1.702 m)  Wt 201 lb 1 oz (91.2 kg)  BMI 31.48 kg/m2  SpO2 99%  LMP 08/23/2015  Intake/Output Summary (Last 24 hours) at 09/25/15 1228 Last data filed at 09/25/15 0900  Gross per 24 hour  Intake    240 ml  Output      0 ml  Net    240 ml   Filed Weights   09/23/15 1722 09/23/15 2242  Weight: 215 lb (97.523 kg) 201 lb 1 oz (91.2 kg)    Exam:   General:  NAD  Cardiovascular: RRR  Respiratory: coarse bilateral, mild wheezes   Abdomen: Soft/ND/NT, positive BS  Musculoskeletal: No Edema  Neuro: aaox3  Data Reviewed: Basic Metabolic  Panel:  Recent Labs Lab 09/23/15 1844 09/23/15 2345 09/24/15 0525 09/25/15 0520  NA 139  --  139 140  K 3.3*  --  3.3* 3.5  CL 102  --  105 105  CO2 26  --  24 25  GLUCOSE 96  --  100* 98  BUN 6  --  <5* <5*  CREATININE 0.85 0.85 0.74 0.68  CALCIUM 8.8*  --  8.0* 8.5*  MG  --   --  1.9  --    Liver Function Tests:  Recent Labs Lab 09/24/15 0525  AST 24  ALT 21  ALKPHOS 96  BILITOT 0.3  PROT 7.2  ALBUMIN 3.1*    Recent Labs Lab 09/23/15 2011  LIPASE 19   No results for input(s): AMMONIA in the last 168 hours. CBC:  Recent Labs Lab 09/23/15 1844 09/23/15 2345 09/24/15 0525 09/25/15 0520  WBC 13.9* 16.4* 15.4* 11.4*  NEUTROABS 9.8*  --  10.8*  --   HGB 7.2* 7.1* 6.3* 7.6*  HCT 27.0* 26.2* 22.9* 27.2*  MCV 60.0* 60.6* 59.8* 62.2*  PLT 501* 503* 447* 522*   Cardiac Enzymes:   No results for input(s): CKTOTAL, CKMB, CKMBINDEX, TROPONINI in the last 168 hours. BNP (last 3 results) No results for input(s): BNP in the last 8760 hours.  ProBNP (last 3 results) No results for input(s): PROBNP in the last 8760 hours.  CBG: No results for input(s): GLUCAP in the last 168 hours.  No results found for this or any previous  visit (from the past 240 hour(s)).   Studies: No results found.  Scheduled Meds: . azithromycin  500 mg Oral Q24H  . cefTRIAXone (ROCEPHIN)  IV  2 g Intravenous Q24H  . guaiFENesin  600 mg Oral BID  . ipratropium-albuterol  3 mL Nebulization 3 times per day    Continuous Infusions:     Time spent: 25 mins  Emmilee Reamer MD, PhD  [facility] Pager [phone]. If 7PM-7AM, please contact night-coverage at [email or web address], password [email or web address] 09/25/2015, 12:28 PM  LOS: 2 days

## 2015-09-25 NOTE — Progress Notes (Signed)
PHARMACY NOTE  Pharmacy has been following for renal adjustment of antibiotics. Dosage remains stable for Ceftriaxone and Azithromycin, and these medications do not require renal adjustment.     Will sign off at this time.  Thanks,  Greer PickerelJigna Azizi Bally, PharmD, BCPS Pager: [phone] 09/25/2015 8:04 AM

## 2015-09-25 NOTE — Clinical Documentation Improvement (Signed)
Internal Medicine  Can the diagnosis of Respiratory Failure be further specified in progress notes and discharge summary?   Document Acuity - Acute, Chronic, Acute on Chronic  Other  Clinically Undetermined  Document any associated diagnoses/conditions.   Supporting Information:  EDP note:  MDM Pt had 02 sat of 88% on room air. Pt reports she feels weak and short of breath. Pt reports short of breath with exertion. Pt given albuterol neb. Chest xray shows multifocal pneumonia, Rocephin and zithromax ordered.   H&P:  Community-acquired pneumonia - patient has been placed on ceftriaxone and Zithromax. Check influenza PCR urine for strep and legionella antigen and HIV status. Continue with gentle hydration. Closely observe respiratory status as chest x-ray shows multifocal pneumonia.  11/16 progress note: Feeling better, getting blood transfusion, still cough, nonproductive, denies chest pain, on 3liter oxygen  Resp rate 24 Respiratory: coarse bialterally  Bilateral multifocal pneumonia with hypoxic respiratory failure: on rocephin/zitrho, oxygen supplement,  11/17 progress note:  Bilateral multifocal pneumonia with hypoxic respiratory failure: on rocephin/zitrho, oxygen supplement,  11/15 CXR FINDINGS: Subsegmental airspace consolidation in anterior and posterior segments right upper lobe. Patchy airspace opacities in the lingula and left lower lobe. Heart size normal. No effusion. No pneumothorax. Visualized skeletal structures are unremarkable.  IMPRESSION: 1. Asymmetric airspace disease as above suggesting multifocal pneumonia.  Treatment: Continuous O2; Keep sats >92% Nebullizers  Please exercise your independent, professional judgment when responding. A specific answer is not anticipated or expected.   Thank You,  Harless Littenebora T [facility] [place] [phone]

## 2015-09-26 DIAGNOSIS — I10 Essential (primary) hypertension: Secondary | ICD-10-CM

## 2015-09-26 LAB — CBC
HEMATOCRIT: 25.9 % — AB (ref 36.0–46.0)
HEMOGLOBIN: 7.2 g/dL — AB (ref 12.0–15.0)
MCH: 17.3 pg — AB (ref 26.0–34.0)
MCHC: 27.8 g/dL — ABNORMAL LOW (ref 30.0–36.0)
MCV: 62.1 fL — AB (ref 78.0–100.0)
Platelets: 521 10*3/uL — ABNORMAL HIGH (ref 150–400)
RBC: 4.17 MIL/uL (ref 3.87–5.11)
RDW: 22.8 % — ABNORMAL HIGH (ref 11.5–15.5)
WBC: 12.9 10*3/uL — ABNORMAL HIGH (ref 4.0–10.5)

## 2015-09-26 LAB — LEGIONELLA PNEUMOPHILA SEROGP 1 UR AG: L. PNEUMOPHILA SEROGP 1 UR AG: NEGATIVE

## 2015-09-26 LAB — BASIC METABOLIC PANEL
Anion gap: 11 (ref 5–15)
BUN: 7 mg/dL (ref 6–20)
CHLORIDE: 102 mmol/L (ref 101–111)
CO2: 25 mmol/L (ref 22–32)
Calcium: 8.8 mg/dL — ABNORMAL LOW (ref 8.9–10.3)
Creatinine, Ser: 0.83 mg/dL (ref 0.44–1.00)
GFR calc Af Amer: >60 mL/min (ref >60)
GFR calc non Af Amer: >60 mL/min (ref >60)
Glucose, Bld: 105 mg/dL — ABNORMAL HIGH (ref 65–99)
POTASSIUM: 3.7 mmol/L (ref 3.5–5.1)
SODIUM: 138 mmol/L (ref 135–145)

## 2015-09-26 LAB — TSH: TSH: 0.782 u[IU]/mL (ref 0.350–4.500)

## 2015-09-26 LAB — MAGNESIUM: MAGNESIUM: 1.9 mg/dL (ref 1.7–2.4)

## 2015-09-26 MED ORDER — DOXYCYCLINE HYCLATE 100 MG PO CAPS
100.0000 mg | ORAL_CAPSULE | Freq: Two times a day (BID) | ORAL | Status: DC
Start: 2015-09-26 — End: 2015-11-05

## 2015-09-26 MED ORDER — HYDROCHLOROTHIAZIDE 12.5 MG PO CAPS: 12.5000 mg | ORAL_CAPSULE | Freq: Every day | ORAL | Status: DC

## 2015-09-26 MED ORDER — FERROUS SULFATE 325 (65 FE) MG PO TABS
325.0000 mg | ORAL_TABLET | Freq: Two times a day (BID) | ORAL | Status: DC
Start: 2015-09-26 — End: 2018-02-17

## 2015-09-26 MED ORDER — METHYLPREDNISOLONE SODIUM SUCC 125 MG IJ SOLR
60.0000 mg | Freq: Once | INTRAMUSCULAR | Status: AC
  Administered 2015-09-26: 60 mg via INTRAVENOUS
  Filled 2015-09-26: qty 0.96

## 2015-09-26 MED ORDER — GUAIFENESIN ER 600 MG PO TB12: 600.0000 mg | ORAL_TABLET | Freq: Two times a day (BID) | ORAL | Status: DC

## 2015-09-26 MED ORDER — FLUCONAZOLE 150 MG PO TABS: 150.0000 mg | ORAL_TABLET | Freq: Once | ORAL | Status: DC

## 2015-09-26 MED ORDER — FLORANEX PO PACK: 1.0000 g | PACK | Freq: Three times a day (TID) | ORAL | Status: DC

## 2015-09-26 NOTE — Discharge Summary (Signed)
Discharge Summary  Jocelyn ForestMickie [name] UJW:[id number] DOB: 1965/11/18  PCP: No PCP Per Patient  Admit date: 09/23/2015 Discharge date: 09/26/2015  Time spent: <8930mins  Recommendations for Outpatient Follow-up:  1. F/u with PMD within a week for hospital discharge follow up, patient to bring in home blood pressure monitor and continue to work with pmd for blood pressure monitoring and management. Repeat cxr in 2-3 weeks to ensure resolution of pna. 2. F/u with GYN for menorrhagia/anemia  Discharge Diagnoses:  Active Hospital Problems   Diagnosis Date Noted  . Pneumonia, community acquired 09/23/2015  . Microcytic hypochromic anemia 09/23/2015  . Tobacco abuse 09/23/2015  . Pneumonia 09/23/2015    Resolved Hospital Problems   Diagnosis Date Noted Date Resolved  No resolved problems to display.    Discharge Condition: stable  Diet recommendation: heart healthy  Filed Weights   09/23/15 1722 09/23/15 2242  Weight: 215 lb (97.523 kg) 201 lb 1 oz (91.2 kg)    History of present illness:  Jocelyn ForestMickie [name] is a 49 y.o. female with history of ongoing tobacco abuse, chronic anemia present severe because of worsening shortness of breath with cough and productive sputum. Patient also at times had nausea vomiting after coughing. Denies abdominal pain. Denies any chest pain. In the ER patient was found to be having fever and blood work shows leukocytosis. Chest x-ray showed multifocal pneumonia and patient has been admitted for further management of community-acquired pneumonia. Patient denies any recent travel or sick contacts. Patient on exam was not having any abdominal pain. Patient's vomiting is only after both of cough. Patient has had previous history of hypertension and has been off antihypertensives after patient last week. Patient is also found to be having hypochromic microcytic anemia and stool. Molly MaduroRobert has been negative. Patient states she has had previous episodes of menorrhagia  which has stopped after fibroid removal.  Hospital Course:  Principal Problem:   Pneumonia, community acquired Active Problems:   Microcytic hypochromic anemia   Tobacco abuse   Pneumonia  Bilateral multifocal pneumonia with acute hypoxic respiratory failure:  received rocephin/zitrho, steroid x2doses, nebs, mucinex, improving, off oxygen, less cough, lung sounds has much improved, Discharge home with doxcycycline,  Antitussives (mucinex/tessalon)  Blood loss anemia: reported heavy mensis, s/p prbc transfusion, iron supplement started, gyn follow up  HTN: patient not taking any meds at home, home bp monitoring, need close pmd follow up, started hctz at discharge.  Hypokalemia; replaced k, mag 1.9  Code Status: full  Family Communication: patient  Disposition Plan: home 11/18   Consultants:  none  Procedures:  none  Antibiotics:  Rocephin/zithro   Discharge Exam: BP 147/87 mmHg  Pulse 82  Temp(Src) 98.1 F (36.7 C) (Oral)  Resp 16  Ht 5\' 7"  (1.702 m)  Wt 201 lb 1 oz (91.2 kg)  BMI 31.48 kg/m2  SpO2 95%  LMP 08/23/2015   General: NAD  Cardiovascular: RRR  Respiratory: breath sounds have much improved bilaterally, now clear with good air movement, not coarse,  Wheezing has resolved.  Abdomen: Soft/ND/NT, positive BS  Musculoskeletal: No Edema  Neuro: aaox3   Discharge Instructions You were cared for by a hospitalist during your hospital stay. If you have any questions about your discharge medications or the care you received while you were in the hospital after you are discharged, you can call the unit and asked to speak with the hospitalist on call if the hospitalist that took care of you is not available. Once you are discharged, your primary care  physician will handle any further medical issues. Please note that NO REFILLS for any discharge medications will be authorized once you are discharged, as it is imperative that you return to your primary  care physician (or establish a relationship with a primary care physician if you do not have one) for your aftercare needs so that they can reassess your need for medications and monitor your lab values.  Discharge Instructions    Diet - low sodium heart healthy    Complete by:  As directed      Increase activity slowly    Complete by:  As directed             Medication List    STOP taking these medications        dextromethorphan 30 MG/5ML liquid  Commonly known as:  DELSYM     dextromethorphan-guaiFENesin 30-600 MG 12hr tablet  Commonly known as:  MUCINEX DM     guaifenesin 100 MG/5ML syrup  Commonly known as:  ROBITUSSIN  Replaced by:  guaiFENesin 600 MG 12 hr tablet      TAKE these medications        acetaminophen 500 MG tablet  Commonly known as:  TYLENOL  Take 500 mg by mouth every 6 (six) hours as needed for mild pain, moderate pain, fever or headache.     doxycycline 100 MG capsule  Commonly known as:  VIBRAMYCIN  Take 1 capsule (100 mg total) by mouth 2 (two) times daily.     ferrous sulfate 325 (65 FE) MG tablet  Take 1 tablet (325 mg total) by mouth 2 (two) times daily with a meal.     fluconazole 150 MG tablet  Commonly known as:  DIFLUCAN  Take 1 tablet (150 mg total) by mouth once. For yeast infection     guaiFENesin 600 MG 12 hr tablet  Commonly known as:  MUCINEX  Take 1 tablet (600 mg total) by mouth 2 (two) times daily.     hydrochlorothiazide 12.5 MG capsule  Commonly known as:  MICROZIDE  Take 1 capsule (12.5 mg total) by mouth daily.     lactobacillus Pack  Take 1 packet (1 g total) by mouth 3 (three) times daily with meals.       No Known Allergies     Follow-up Information    Follow up with HOPPER,DAVID, MD In 1 week.   Specialty:  Family Medicine   Why:  hospital discharge follow up, repeat cxr in 2-3 weeks, blood pressure monitoring   Contact information:   [address] [place] [address] [phone]       Follow up  with Juluis Mire, MD.   Specialty:  Obstetrics and Gynecology   Why:  heavy menses/anemia   Contact information:   [address] [place] [address] [phone]        The results of significant diagnostics from this hospitalization (including imaging, microbiology, ancillary and laboratory) are listed below for reference.    Significant Diagnostic Studies: Dg Chest 2 View  09/23/2015  CLINICAL DATA:  Persistent cough x 3 days - chills - generalized body aches - nonsmoker - pt states no other chest hx EXAM: CHEST - 2 VIEW COMPARISON:  None available FINDINGS: Subsegmental airspace consolidation in anterior and posterior segments right upper lobe. Patchy airspace opacities in the lingula and left lower lobe. Heart size normal. No effusion.  No pneumothorax. Visualized skeletal structures are unremarkable. IMPRESSION: 1. Asymmetric airspace disease as above suggesting  multifocal pneumonia. Electronically Signed   By: Corlis Leak M.D.   On: 09/23/2015 18:03    Microbiology: Recent Results (from the past 240 hour(s))  Culture, blood (routine x 2) Call MD if unable to obtain prior to antibiotics being given     Status: None (Preliminary result)   Collection Time: 09/23/15 11:45 PM  Result Value Ref Range Status   Specimen Description BLOOD BLOOD LEFT HAND  Final   Special Requests BOTTLES DRAWN AEROBIC ONLY  Final   Culture   Final    NO GROWTH 1 DAY Performed at [facility]    Report Status PENDING  Incomplete  Culture, blood (routine x 2) Call MD if unable to obtain prior to antibiotics being given     Status: None (Preliminary result)   Collection Time: 09/23/15 11:45 PM  Result Value Ref Range Status   Specimen Description BLOOD LEFT ANTECUBITAL  Final   Special Requests BOTTLES DRAWN AEROBIC AND ANAEROBIC  Final   Culture   Final    NO GROWTH 1 DAY Performed at [facility]    Report Status PENDING  Incomplete     Labs: Basic  Metabolic Panel:  Recent Labs Lab 09/23/15 1844 09/23/15 2345 09/24/15 0525 09/25/15 0520 09/26/15 0525  NA 139  --  139 140 138  K 3.3*  --  3.3* 3.5 3.7  CL 102  --  105 105 102  CO2 26  --  GLUCOSE 96  --  100* 98 105*  BUN 6  --  <5* <5* 7  CREATININE 0.85 0.85 0.74 0.68 0.83  CALCIUM 8.8*  --  8.0* 8.5* 8.8*  MG  --   --  1.9  --  1.9   Liver Function Tests:  Recent Labs Lab 09/24/15 0525  AST 24  ALT 21  ALKPHOS 96  BILITOT 0.3  PROT 7.2  ALBUMIN 3.1*    Recent Labs Lab 09/23/15 2011  LIPASE 19   No results for input(s): AMMONIA in the last 168 hours. CBC:  Recent Labs Lab 09/23/15 1844 09/23/15 2345 09/24/15 0525 09/25/15 0520 09/26/15 0525  WBC 13.9* 16.4* 15.4* 11.4* 12.9*  NEUTROABS 9.8*  --  10.8*  --   --   HGB 7.2* 7.1* 6.3* 7.6* 7.2*  HCT 27.0* 26.2* 22.9* 27.2* 25.9*  MCV 60.0* 60.6* 59.8* 62.2* 62.1*  PLT 501* 503* 447* 522* 521*   Cardiac Enzymes: No results for input(s): CKTOTAL, CKMB, CKMBINDEX, TROPONINI in the last 168 hours. BNP: BNP (last 3 results) No results for input(s): BNP in the last 8760 hours.  ProBNP (last 3 results) No results for input(s): PROBNP in the last 8760 hours.  CBG: No results for input(s): GLUCAP in the last 168 hours.     SignedAlbertine Grates MD, PhD  [facility] 09/26/2015, 10:46 AM

## 2015-09-29 LAB — CULTURE, BLOOD (ROUTINE X 2)
CULTURE: NO GROWTH
Culture: NO GROWTH

## 2015-11-05 ENCOUNTER — Ambulatory Visit (INDEPENDENT_AMBULATORY_CARE_PROVIDER_SITE_OTHER): Payer: 59 | Admitting: Family Medicine

## 2015-11-05 ENCOUNTER — Ambulatory Visit (INDEPENDENT_AMBULATORY_CARE_PROVIDER_SITE_OTHER): Payer: 59

## 2015-11-05 VITALS — BP 142/86 | HR 86 | Temp 98.2°F | Resp 16 | Ht 67.0 in | Wt 199.8 lb

## 2015-11-05 DIAGNOSIS — F17218 Nicotine dependence, cigarettes, with other nicotine-induced disorders: Secondary | ICD-10-CM

## 2015-11-05 DIAGNOSIS — R05 Cough: Secondary | ICD-10-CM

## 2015-11-05 DIAGNOSIS — J189 Pneumonia, unspecified organism: Secondary | ICD-10-CM | POA: Diagnosis not present

## 2015-11-05 DIAGNOSIS — R0781 Pleurodynia: Secondary | ICD-10-CM

## 2015-11-05 DIAGNOSIS — R059 Cough, unspecified: Secondary | ICD-10-CM

## 2015-11-05 LAB — POCT INFLUENZA A/B
Influenza A, POC: NEGATIVE
Influenza B, POC: NEGATIVE

## 2015-11-05 MED ORDER — HYDROCODONE-HOMATROPINE 5-1.5 MG/5ML PO SYRP
5.0000 mL | ORAL_SOLUTION | Freq: Every evening | ORAL | Status: DC | PRN
Start: 2015-11-05 — End: 2018-02-13

## 2015-11-05 MED ORDER — AZITHROMYCIN 250 MG PO TABS: ORAL_TABLET | ORAL | Status: DC

## 2015-11-05 MED ORDER — BENZONATATE 200 MG PO CAPS: 200.0000 mg | ORAL_CAPSULE | Freq: Three times a day (TID) | ORAL | Status: DC | PRN

## 2015-11-05 NOTE — Progress Notes (Signed)
Chief Complaint:  Chief Complaint  Patient presents with  . Follow-up    Pneumonia back in november  . Cough    non productive  . Generalized Body Aches    HPI: Jocelyn WOLGAMOTT is a 49 y.o. female who reports to [facility] today complaining of  Non productive Cough and congestion since today, msk aches as well. She is worried she may have PNA again since she was hospitalized in 09/2015 with PNA for 3 days.  She is here for fu PNA in 09/2015. She is a smoker, but less than beofre, denies any wheexing, SOB. No sinus pressure No sore throat. She has pleuritic pain with cough. Has tried robitussin without releif.  Was dc from hospital with Doxycyline   Past Medical History  Diagnosis Date  . Hypertension   . Anemia    Past Surgical History  Procedure Laterality Date  . Myomectomy     Social History   Social History  . Marital Status: Married    Spouse Name: N/A  . Number of Children: N/A  . Years of Education: N/A   Social History Main Topics  . Smoking status: Current Some Day Smoker -- 0.40 packs/day for 5 years    Types: Cigarettes  . Smokeless tobacco: Never Used  . Alcohol Use: 0.0 oz/week    0 Standard drinks or equivalent per week  . Drug Use: No  . Sexual Activity: Yes    Birth Control/ Protection: Abstinence   Other Topics Concern  . None   Social History Narrative   Family History  Problem Relation Age of Onset  . Diabetes Mellitus II Neg Hx   . Hypertension Neg Hx    No Known Allergies Prior to Admission medications   Medication Sig Start Date End Date Taking? Authorizing Provider  ferrous sulfate 325 (65 FE) MG tablet Take 1 tablet (325 mg total) by mouth 2 (two) times daily with a meal. 09/26/15  Yes Albertine Grates, MD  hydrochlorothiazide (MICROZIDE) 12.5 MG capsule Take 1 capsule (12.5 mg total) by mouth daily. 09/26/15  Yes Albertine Grates, MD  acetaminophen (TYLENOL) 500 MG tablet Take 500 mg by mouth every 6 (six) hours as needed for mild pain, moderate  pain, fever or headache. Reported on 11/05/2015    Historical Provider, MD  doxycycline (VIBRAMYCIN) 100 MG capsule Take 1 capsule (100 mg total) by mouth 2 (two) times daily. Patient not taking: Reported on 11/05/2015 09/26/15   Albertine Grates, MD  fluconazole (DIFLUCAN) 150 MG tablet Take 1 tablet (150 mg total) by mouth once. For yeast infection Patient not taking: Reported on 11/05/2015 09/26/15   Albertine Grates, MD  guaiFENesin (MUCINEX) 600 MG 12 hr tablet Take 1 tablet (600 mg total) by mouth 2 (two) times daily. Patient not taking: Reported on 11/05/2015 09/26/15   Albertine Grates, MD  lactobacillus (FLORANEX/LACTINEX) PACK Take 1 packet (1 g total) by mouth 3 (three) times daily with meals. Patient not taking: Reported on 11/05/2015 09/26/15   Albertine Grates, MD     ROS: The patient denies fevers, chills, night sweats, unintentional weight loss, palpitations, wheezing, dyspnea on exertion, nausea, vomiting, abdominal pain, dysuria, hematuria, melena, numbness, weakness, or tingling.   All other systems have been reviewed and were otherwise negative with the exception of those mentioned in the HPI and as above.    PHYSICAL EXAM: Filed Vitals:   11/05/15 1920  BP: 142/86  Pulse: 86  Temp: 98.2 F (36.8 C)  Resp:  16   Body mass index is 31.29 kg/(m^2).   General: Alert, no acute distress HEENT:  Normocephalic, atraumatic, oropharynx patent. EOMI, PERRLA Erythematous throat, no exudates, TM normal, - sinus tenderness, + erythematous/boggy nasal mucosa Cardiovascular:  Regular rate and rhythm, no rubs murmurs or gallops.  No Carotid bruits, radial pulse intact. No pedal edema.  Respiratory: Clear to auscultation bilaterally.  No wheezes, rales, or rhonchi.  No cyanosis, no use of accessory musculature Abdominal: No organomegaly, abdomen is soft and non-tender, positive bowel sounds. No masses. Skin: No rashes. Neurologic: Facial musculature symmetric. Psychiatric: Patient acts appropriately throughout our  interaction. Lymphatic: No cervical or submandibular lymphadenopathy Musculoskeletal: Gait intact. No edema, tenderness   LABS: Results for orders placed or performed in visit on 11/05/15  POCT Influenza A/B  Result Value Ref Range   Influenza A, POC Negative Negative   Influenza B, POC Negative Negative     EKG/XRAY:   [facility]. Improved PNA,  No effusion or pneumo, please comment if peribronchial thickening on lateral view is residual infiltrate   ASSESSMENT/PLAN: Encounter Diagnoses  Name Primary?  . Cough Yes  . Pleuritic chest pain   . CAP (community acquired pneumonia)   . Cigarette nicotine dependence with other nicotine-induced disorder    Rx azithromycin Rx Tessalon perles, hycodan She is not ready to quit smoking completely , will try harder Fu prn   Gross sideeffects, risk and benefits, and alternatives of medications d/w patient. Patient is aware that all medications have potential sideeffects and we are unable to predict every sideeffect or drug-drug interaction that may occur.  Rocklin Soderquist DO  11/06/2015 9:27 AM

## 2015-11-25 ENCOUNTER — Other Ambulatory Visit: Payer: Self-pay

## 2015-11-25 MED ORDER — HYDROCHLOROTHIAZIDE 12.5 MG PO CAPS: 12.5000 mg | ORAL_CAPSULE | Freq: Every day | ORAL | Status: DC

## 2016-04-04 IMAGING — CR DG CHEST 2V
2 series · 2 of 2 positions shown · non-contrast
Comparison: Chest radiograph performed 09/23/2015

CLINICAL DATA: Acute onset of cough and congestion. Body aches.
Initial encounter.

EXAM:
CHEST  2 VIEW

[PA]
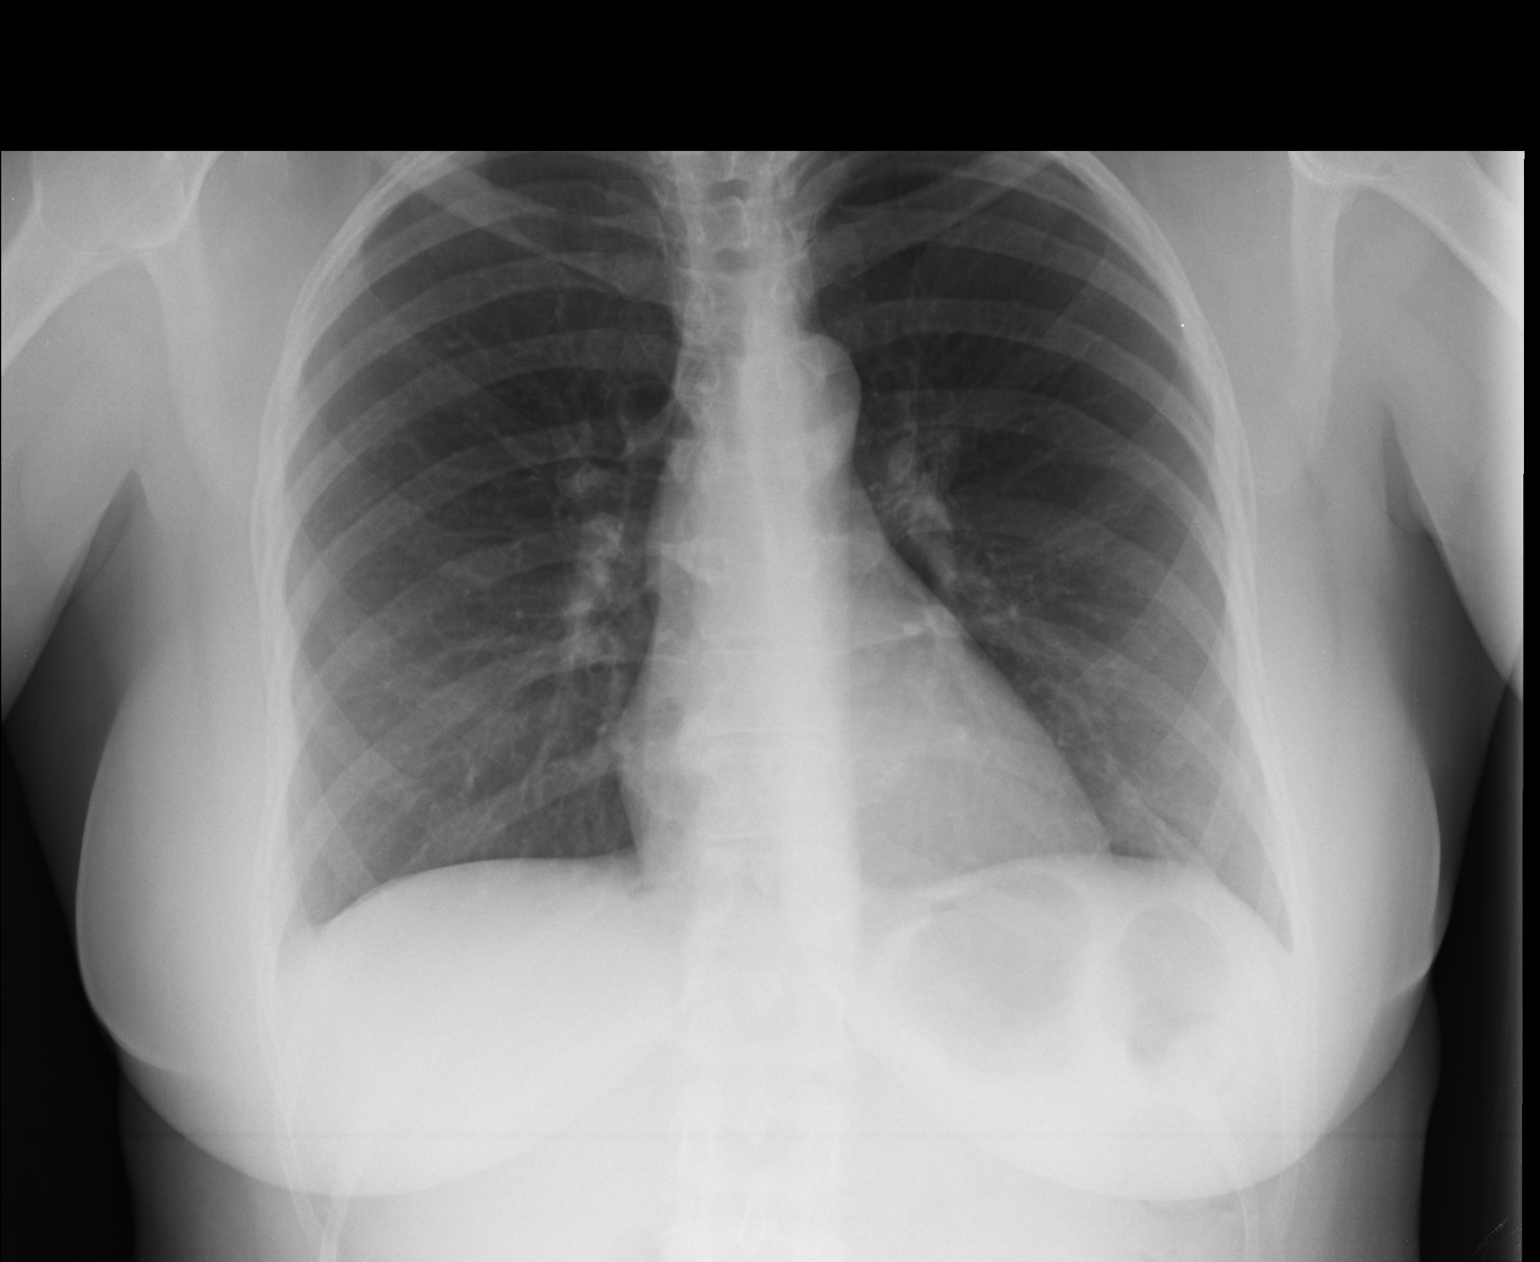

[lateral]
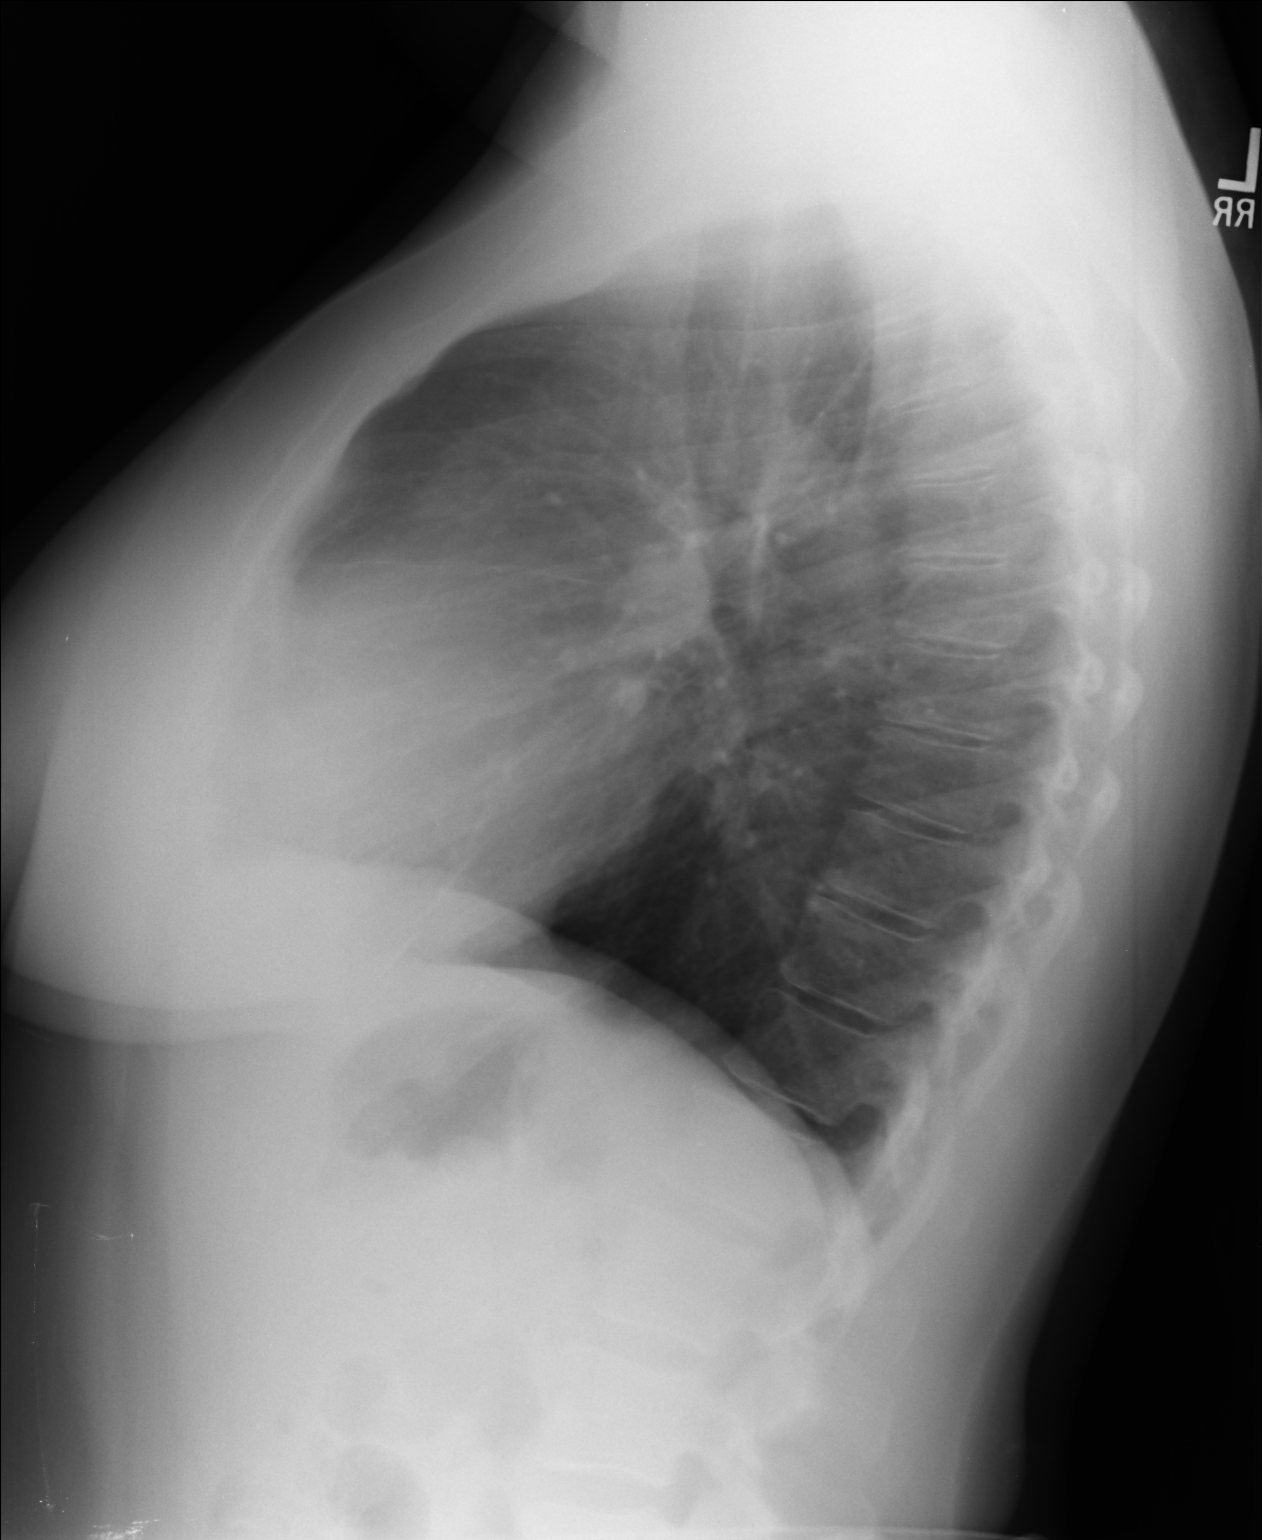

[2 of 2 positions shown; findings below may reference images not displayed]

FINDINGS: The lungs are well-aerated and clear. Previously noted pneumonia has
resolved. There is no evidence of focal opacification, pleural
effusion or pneumothorax. No significant peribronchial thickening is
seen.

The heart is normal in size; the mediastinal contour is within
normal limits. No acute osseous abnormalities are seen.
IMPRESSION: No acute cardiopulmonary process seen. Previously noted pneumonia
has resolved.

## 2017-07-21 ENCOUNTER — Emergency Department (HOSPITAL_COMMUNITY)
Admission: EM | Admit: 2017-07-21 | Discharge: 2017-07-21 | Disposition: A | Discharge status: Home / Self Care | Payer: 59 | Attending: Emergency Medicine | Admitting: Emergency Medicine

## 2017-07-21 ENCOUNTER — Encounter (HOSPITAL_COMMUNITY): Payer: Self-pay | Admitting: Emergency Medicine

## 2017-07-21 DIAGNOSIS — F17210 Nicotine dependence, cigarettes, uncomplicated: Secondary | ICD-10-CM | POA: Insufficient documentation

## 2017-07-21 DIAGNOSIS — Z79899 Other long term (current) drug therapy: Secondary | ICD-10-CM | POA: Diagnosis not present

## 2017-07-21 DIAGNOSIS — D509 Iron deficiency anemia, unspecified: Secondary | ICD-10-CM | POA: Insufficient documentation

## 2017-07-21 DIAGNOSIS — R509 Fever, unspecified: Secondary | ICD-10-CM | POA: Diagnosis present

## 2017-07-21 DIAGNOSIS — I10 Essential (primary) hypertension: Secondary | ICD-10-CM | POA: Diagnosis not present

## 2017-07-21 DIAGNOSIS — N10 Acute tubulo-interstitial nephritis: Secondary | ICD-10-CM | POA: Insufficient documentation

## 2017-07-21 LAB — I-STAT CHEM 8, ED
BUN: 5 mg/dL — ABNORMAL LOW (ref 6–20)
Calcium, Ion: 1.09 mmol/L — ABNORMAL LOW (ref 1.15–1.40)
Chloride: 100 mmol/L — ABNORMAL LOW (ref 101–111)
Creatinine, Ser: 1 mg/dL (ref 0.44–1.00)
Glucose, Bld: 93 mg/dL (ref 65–99)
HEMATOCRIT: 25 % — AB (ref 36.0–46.0)
Hemoglobin: 8.5 g/dL — ABNORMAL LOW (ref 12.0–15.0)
Potassium: 3.7 mmol/L (ref 3.5–5.1)
SODIUM: 139 mmol/L (ref 135–145)
TCO2: 28 mmol/L (ref 22–32)

## 2017-07-21 LAB — CBC WITH DIFFERENTIAL/PLATELET
BASOS ABS: 0 10*3/uL (ref 0.0–0.1)
Basophils Relative: 0 %
Eosinophils Absolute: 0.1 10*3/uL (ref 0.0–0.7)
Eosinophils Relative: 1 %
HCT: 24.5 % — ABNORMAL LOW (ref 36.0–46.0)
Hemoglobin: 6.8 g/dL — CL (ref 12.0–15.0)
LYMPHS PCT: 10 %
Lymphs Abs: 1.7 10*3/uL (ref 0.7–4.0)
MCH: 16.3 pg — ABNORMAL LOW (ref 26.0–34.0)
MCHC: 27.8 g/dL — AB (ref 30.0–36.0)
MCV: 58.9 fL — ABNORMAL LOW (ref 78.0–100.0)
MONO ABS: 2.2 10*3/uL — AB (ref 0.1–1.0)
MONOS PCT: 13 %
Neutro Abs: 12.5 10*3/uL — ABNORMAL HIGH (ref 1.7–7.7)
Neutrophils Relative %: 76 %
Platelets: 362 10*3/uL (ref 150–400)
RBC: 4.16 MIL/uL (ref 3.87–5.11)
RDW: 22.2 % — AB (ref 11.5–15.5)
WBC: 16.5 10*3/uL — ABNORMAL HIGH (ref 4.0–10.5)

## 2017-07-21 LAB — URINALYSIS, ROUTINE W REFLEX MICROSCOPIC
BACTERIA UA: NONE SEEN
Bilirubin Urine: NEGATIVE
GLUCOSE, UA: NEGATIVE mg/dL
Ketones, ur: NEGATIVE mg/dL
NITRITE: NEGATIVE
PROTEIN: 100 mg/dL — AB
Specific Gravity, Urine: 1.014 (ref 1.005–1.030)
pH: 6 (ref 5.0–8.0)

## 2017-07-21 MED ORDER — CEPHALEXIN 500 MG PO CAPS
500.0000 mg | ORAL_CAPSULE | Freq: Once | ORAL | Status: AC
  Administered 2017-07-21: 500 mg via ORAL
  Filled 2017-07-21: qty 1

## 2017-07-21 MED ORDER — CEPHALEXIN 500 MG PO CAPS: 500.0000 mg | ORAL_CAPSULE | Freq: Three times a day (TID) | ORAL | 0 refills | Status: AC

## 2017-07-21 MED ORDER — KETOROLAC TROMETHAMINE 60 MG/2ML IM SOLN
30.0000 mg | Freq: Once | INTRAMUSCULAR | Status: AC
  Administered 2017-07-21: 30 mg via INTRAMUSCULAR
  Filled 2017-07-21: qty 2

## 2017-07-21 MED ORDER — ACETAMINOPHEN 325 MG PO TABS
650.0000 mg | ORAL_TABLET | Freq: Once | ORAL | Status: AC | PRN
  Administered 2017-07-21: 650 mg via ORAL
  Filled 2017-07-21: qty 2

## 2017-07-21 NOTE — ED Provider Notes (Signed)
[facility]-EMERGENCY DEPT Provider Note   CSN: [id number] Arrival date & time: 07/21/17  1352     History   Chief Complaint Chief Complaint  Patient presents with  . Generalized Body Aches  . Fever  . Headache    HPI Jocelyn [name] is a 51 y.o. female.  Patient reports onset of generalized body aches, fever, and headache onset yesterday.  Denies cough, runny nose, shortness of breath, nausea/vomiting. Bilateral flank pain radiating across upper abdomen.   The history is provided by the patient. No language interpreter was used.  Illness  This is a new problem. The current episode started yesterday. The problem occurs constantly. The problem has not changed since onset.Associated symptoms include headaches. Pertinent negatives include no chest pain, no abdominal pain and no shortness of breath. Treatments tried: alka seltzer cold. The treatment provided no relief.    Past Medical History:  Diagnosis Date  . Anemia   . Hypertension     Patient Active Problem List   Diagnosis Date Noted  . Pneumonia, community acquired 09/23/2015  . Microcytic hypochromic anemia 09/23/2015  . Tobacco abuse 09/23/2015  . Pneumonia 09/23/2015  . Nicotine addiction 12/09/2013  . Microcytic anemia 12/09/2013  . HTN (hypertension) 12/09/2013  . BMI 36.0-36.9,adult 12/09/2013    Past Surgical History:  Procedure Laterality Date  . MYOMECTOMY      OB History    No data available       Home Medications    Prior to Admission medications   Medication Sig Start Date End Date Taking? Authorizing Provider  acetaminophen (TYLENOL) 500 MG tablet Take 500 mg by mouth every 6 (six) hours as needed for mild pain, moderate pain, fever or headache. Reported on 11/05/2015    [provider]  azithromycin (ZITHROMAX) 250 MG tablet Take 2 tabs po now then 1 tab po daily for the next 4 days 11/05/15   Le, Thao P, DO  benzonatate (TESSALON) 200 MG capsule Take 1 capsule (200 mg total) by  mouth 3 (three) times daily as needed for cough. 11/05/15   Le, Thao P, DO  ferrous sulfate 325 (65 FE) MG tablet Take 1 tablet (325 mg total) by mouth 2 (two) times daily with a meal. 09/26/15   Albertine Grates, MD  fluconazole (DIFLUCAN) 150 MG tablet Take 1 tablet (150 mg total) by mouth once. For yeast infection Patient not taking: Reported on 11/05/2015 09/26/15   Albertine Grates, MD  hydrochlorothiazide (MICROZIDE) 12.5 MG capsule Take 1 capsule (12.5 mg total) by mouth daily. 11/25/15   Le, Thao P, DO  HYDROcodone-homatropine (HYCODAN) 5-1.5 MG/5ML syrup Take 5 mLs by mouth at bedtime as needed. 11/05/15   Le, Thao P, DO  lactobacillus (FLORANEX/LACTINEX) PACK Take 1 packet (1 g total) by mouth 3 (three) times daily with meals. Patient not taking: Reported on 11/05/2015 09/26/15   Albertine Grates, MD    Family History Family History  Problem Relation Age of Onset  . Diabetes Mellitus II Neg Hx   . Hypertension Neg Hx     Social History Social History  Substance Use Topics  . Smoking status: Current Some Day Smoker    Packs/day: 0.40    Years: 5.00    Types: Cigarettes  . Smokeless tobacco: Never Used  . Alcohol use 0.0 oz/week     Allergies   Patient has no known allergies.   Review of Systems Review of Systems  Constitutional: Positive for chills and fever.  HENT: Negative for rhinorrhea and  sore throat.   Respiratory: Negative for cough and shortness of breath.   Cardiovascular: Negative for chest pain.  Gastrointestinal: Negative for abdominal pain.  Musculoskeletal: Positive for myalgias.  Neurological: Positive for headaches.  All other systems reviewed and are negative.    Physical Exam Updated Vital Signs BP 139/77 (BP Location: Left Arm)   Pulse 98   Temp (!) 101 F (38.3 C) (Oral)   Resp 18   Ht 5\' 7"  (1.702 m)   Wt 80.3 kg (177 lb)   LMP 07/15/2017   SpO2 94%   BMI 27.72 kg/m   Physical Exam  Constitutional: She is oriented to person, place, and time. She  appears well-developed and well-nourished. No distress.  HENT:  Mouth/Throat: Oropharynx is clear and moist. No oropharyngeal exudate.  Eyes: Conjunctivae and EOM are normal.  Neck: Neck supple.  Cardiovascular: Normal rate and regular rhythm.   Pulmonary/Chest: Effort normal and breath sounds normal.  Abdominal: Soft. Bowel sounds are normal.  Musculoskeletal: She exhibits tenderness. She exhibits no edema.  Lymphadenopathy:    She has no cervical adenopathy.  Neurological: She is alert and oriented to person, place, and time.  Skin: Skin is warm and dry. No rash noted.  Psychiatric: She has a normal mood and affect.  Nursing note and vitals reviewed.    ED Treatments / Results  Labs (all labs ordered are listed, but only abnormal results are displayed) Labs Reviewed  URINALYSIS, ROUTINE W REFLEX MICROSCOPIC - Abnormal; Notable for the following:       Result Value   Color, Urine RED (*)    APPearance CLOUDY (*)    Hgb urine dipstick LARGE (*)    Protein, ur 100 (*)    Leukocytes, UA LARGE (*)    Squamous Epithelial / LPF 6-30 (*)    Non Squamous Epithelial 0-5 (*)    All other components within normal limits  CBC WITH DIFFERENTIAL/PLATELET - Abnormal; Notable for the following:    WBC 16.5 (*)    Hemoglobin 6.8 (*)    HCT 24.5 (*)    MCV 58.9 (*)    MCH 16.3 (*)    MCHC 27.8 (*)    RDW 22.2 (*)    Neutro Abs 12.5 (*)    Monocytes Absolute 2.2 (*)    All other components within normal limits  I-STAT CHEM 8, ED - Abnormal; Notable for the following:    Chloride 100 (*)    BUN 5 (*)    Calcium, Ion 1.09 (*)    Hemoglobin 8.5 (*)    HCT 25.0 (*)    All other components within normal limits    EKG  EKG Interpretation None       Radiology No results found.  Procedures Procedures (including critical care time)  Medications Ordered in ED Medications  acetaminophen (TYLENOL) tablet 650 mg (650 mg Oral Given 07/21/17 1417)  ketorolac (TORADOL) injection 30  mg (30 mg Intramuscular Given 07/21/17 1950)  cephALEXin (KEFLEX) capsule 500 mg (500 mg Oral Given 07/21/17 2156)     Initial Impression / Assessment and Plan / ED Course  I have reviewed the triage vital signs and the nursing notes.  Pertinent labs & imaging results that were available during my care of the patient were reviewed by me and considered in my medical decision making (see chart for details).     Pt diagnosed with a UTI. Pt with fever. No tachycardia, hypotension, or other signs of serious infection.  Pt to  be dc home with antibiotics and instructions to follow up with PCP if symptoms persist. Discussed return precautions. Pt appears safe for discharge.  Chronic anemia. Encouraged iron supplements. Follow-up with PCP.  Final Clinical Impressions(s) / ED Diagnoses   Final diagnoses:  Pyelonephritis, acute  Microcytic anemia    New Prescriptions Discharge Medication List as of 07/21/2017  9:55 PM    START taking these medications   Details  cephALEXin (KEFLEX) 500 MG capsule Take 1 capsule (500 mg total) by mouth 3 (three) times daily., Starting Thu 07/21/2017, Until Sun 07/31/2017, Print         Felicie Morn, NP 07/22/17 1610    Pricilla Loveless, MD 07/22/17 1255

## 2017-07-21 NOTE — ED Notes (Signed)
Date and time results received: 07/21/17 2037   Test: Hgb Critical Value: 6.8 Name of Provider Notified: Felicie Mornavid Smith NP Orders Received? Or Actions Taken?:see chart

## 2018-02-06 DIAGNOSIS — I619 Nontraumatic intracerebral hemorrhage, unspecified: Secondary | ICD-10-CM

## 2018-02-06 HISTORY — DX: Nontraumatic intracerebral hemorrhage, unspecified: I61.9

## 2018-02-13 ENCOUNTER — Emergency Department (HOSPITAL_COMMUNITY): Payer: 59

## 2018-02-13 ENCOUNTER — Encounter (HOSPITAL_COMMUNITY): Payer: Self-pay | Admitting: *Deleted

## 2018-02-13 ENCOUNTER — Inpatient Hospital Stay (HOSPITAL_COMMUNITY): Payer: 59

## 2018-02-13 ENCOUNTER — Other Ambulatory Visit: Payer: Self-pay

## 2018-02-13 ENCOUNTER — Inpatient Hospital Stay (HOSPITAL_COMMUNITY)
Admission: EM | Admit: 2018-02-13 | Discharge: 2018-02-17 | DRG: 064 | Disposition: A | Discharge status: Rehabilitation Facility | Payer: 59 | Attending: Neurology | Admitting: Neurology

## 2018-02-13 DIAGNOSIS — G936 Cerebral edema: Secondary | ICD-10-CM | POA: Diagnosis present

## 2018-02-13 DIAGNOSIS — F17210 Nicotine dependence, cigarettes, uncomplicated: Secondary | ICD-10-CM | POA: Diagnosis present

## 2018-02-13 DIAGNOSIS — I69252 Hemiplegia and hemiparesis following other nontraumatic intracranial hemorrhage affecting left dominant side: Secondary | ICD-10-CM | POA: Diagnosis not present

## 2018-02-13 DIAGNOSIS — I69154 Hemiplegia and hemiparesis following nontraumatic intracerebral hemorrhage affecting left non-dominant side: Secondary | ICD-10-CM | POA: Diagnosis not present

## 2018-02-13 DIAGNOSIS — I610 Nontraumatic intracerebral hemorrhage in hemisphere, subcortical: Principal | ICD-10-CM | POA: Diagnosis present

## 2018-02-13 DIAGNOSIS — R471 Dysarthria and anarthria: Secondary | ICD-10-CM | POA: Diagnosis present

## 2018-02-13 DIAGNOSIS — E876 Hypokalemia: Secondary | ICD-10-CM | POA: Diagnosis not present

## 2018-02-13 DIAGNOSIS — F1510 Other stimulant abuse, uncomplicated: Secondary | ICD-10-CM | POA: Diagnosis present

## 2018-02-13 DIAGNOSIS — D638 Anemia in other chronic diseases classified elsewhere: Secondary | ICD-10-CM | POA: Diagnosis not present

## 2018-02-13 DIAGNOSIS — G8190 Hemiplegia, unspecified affecting unspecified side: Secondary | ICD-10-CM

## 2018-02-13 DIAGNOSIS — I619 Nontraumatic intracerebral hemorrhage, unspecified: Secondary | ICD-10-CM | POA: Diagnosis present

## 2018-02-13 DIAGNOSIS — R29709 NIHSS score 9: Secondary | ICD-10-CM | POA: Diagnosis present

## 2018-02-13 DIAGNOSIS — I161 Hypertensive emergency: Secondary | ICD-10-CM | POA: Diagnosis present

## 2018-02-13 DIAGNOSIS — K5901 Slow transit constipation: Secondary | ICD-10-CM | POA: Diagnosis not present

## 2018-02-13 DIAGNOSIS — I10 Essential (primary) hypertension: Secondary | ICD-10-CM | POA: Diagnosis not present

## 2018-02-13 DIAGNOSIS — G8320 Monoplegia of upper limb affecting unspecified side: Secondary | ICD-10-CM | POA: Diagnosis not present

## 2018-02-13 DIAGNOSIS — Z720 Tobacco use: Secondary | ICD-10-CM | POA: Diagnosis present

## 2018-02-13 DIAGNOSIS — I69354 Hemiplegia and hemiparesis following cerebral infarction affecting left non-dominant side: Secondary | ICD-10-CM | POA: Diagnosis not present

## 2018-02-13 DIAGNOSIS — R29810 Facial weakness: Secondary | ICD-10-CM | POA: Diagnosis present

## 2018-02-13 DIAGNOSIS — E785 Hyperlipidemia, unspecified: Secondary | ICD-10-CM | POA: Diagnosis present

## 2018-02-13 DIAGNOSIS — M7062 Trochanteric bursitis, left hip: Secondary | ICD-10-CM | POA: Diagnosis not present

## 2018-02-13 DIAGNOSIS — F17200 Nicotine dependence, unspecified, uncomplicated: Secondary | ICD-10-CM | POA: Diagnosis not present

## 2018-02-13 DIAGNOSIS — D62 Acute posthemorrhagic anemia: Secondary | ICD-10-CM | POA: Diagnosis not present

## 2018-02-13 HISTORY — DX: Nontraumatic intracerebral hemorrhage, unspecified: I61.9

## 2018-02-13 LAB — RAPID URINE DRUG SCREEN, HOSP PERFORMED
Amphetamines: POSITIVE — AB
BARBITURATES: NOT DETECTED
BENZODIAZEPINES: NOT DETECTED
Cocaine: NOT DETECTED
Opiates: NOT DETECTED
TETRAHYDROCANNABINOL: NOT DETECTED

## 2018-02-13 LAB — CBC
HEMATOCRIT: 33.4 % — AB (ref 36.0–46.0)
HEMOGLOBIN: 9.2 g/dL — AB (ref 12.0–15.0)
MCH: 17.1 pg — ABNORMAL LOW (ref 26.0–34.0)
MCHC: 27.5 g/dL — AB (ref 30.0–36.0)
MCV: 62.2 fL — ABNORMAL LOW (ref 78.0–100.0)
Platelets: 462 10*3/uL — ABNORMAL HIGH (ref 150–400)
RBC: 5.37 MIL/uL — AB (ref 3.87–5.11)
RDW: 21.4 % — ABNORMAL HIGH (ref 11.5–15.5)
WBC: 11.9 10*3/uL — ABNORMAL HIGH (ref 4.0–10.5)

## 2018-02-13 LAB — DIFFERENTIAL
BASOS ABS: 0 10*3/uL (ref 0.0–0.1)
Basophils Relative: 0 %
EOS PCT: 0 %
Eosinophils Absolute: 0 10*3/uL (ref 0.0–0.7)
LYMPHS PCT: 8 %
Lymphs Abs: 1 10*3/uL (ref 0.7–4.0)
MONOS PCT: 6 %
Monocytes Absolute: 0.7 10*3/uL (ref 0.1–1.0)
Neutro Abs: 10.2 10*3/uL — ABNORMAL HIGH (ref 1.7–7.7)
Neutrophils Relative %: 86 %

## 2018-02-13 LAB — MRSA PCR SCREENING: MRSA by PCR: NEGATIVE

## 2018-02-13 LAB — URINALYSIS, ROUTINE W REFLEX MICROSCOPIC
BILIRUBIN URINE: NEGATIVE
GLUCOSE, UA: NEGATIVE mg/dL
KETONES UR: 5 mg/dL — AB
Nitrite: NEGATIVE
PH: 5 (ref 5.0–8.0)
Specific Gravity, Urine: 1.03 (ref 1.005–1.030)

## 2018-02-13 LAB — CK TOTAL AND CKMB (NOT AT ARMC)
CK, MB: 14.3 ng/mL — ABNORMAL HIGH (ref 0.5–5.0)
Relative Index: 0.9 (ref 0.0–2.5)
Total CK: 1541 U/L — ABNORMAL HIGH (ref 38–234)

## 2018-02-13 LAB — I-STAT CHEM 8, ED
BUN: 14 mg/dL (ref 6–20)
CREATININE: 0.6 mg/dL (ref 0.44–1.00)
Calcium, Ion: 1.12 mmol/L — ABNORMAL LOW (ref 1.15–1.40)
Chloride: 103 mmol/L (ref 101–111)
GLUCOSE: 111 mg/dL — AB (ref 65–99)
HEMATOCRIT: 36 % (ref 36.0–46.0)
Hemoglobin: 12.2 g/dL (ref 12.0–15.0)
POTASSIUM: 3.8 mmol/L (ref 3.5–5.1)
Sodium: 140 mmol/L (ref 135–145)
TCO2: 26 mmol/L (ref 22–32)

## 2018-02-13 LAB — LIPID PANEL
CHOL/HDL RATIO: 3.2 ratio
CHOLESTEROL: 175 mg/dL (ref 0–200)
HDL: 55 mg/dL (ref >40)
LDL Cholesterol: 115 mg/dL — ABNORMAL HIGH (ref 0–99)
Triglycerides: 26 mg/dL (ref <150)
VLDL: 5 mg/dL (ref 0–40)

## 2018-02-13 LAB — COMPREHENSIVE METABOLIC PANEL
ALK PHOS: 82 U/L (ref 38–126)
ALT: 16 U/L (ref 14–54)
AST: 35 U/L (ref 15–41)
Albumin: 3.8 g/dL (ref 3.5–5.0)
Anion gap: 19 — ABNORMAL HIGH (ref 5–15)
BUN: 12 mg/dL (ref 6–20)
CALCIUM: 9.3 mg/dL (ref 8.9–10.3)
CHLORIDE: 103 mmol/L (ref 101–111)
CO2: 17 mmol/L — ABNORMAL LOW (ref 22–32)
CREATININE: 0.69 mg/dL (ref 0.44–1.00)
GFR calc Af Amer: >60 mL/min (ref >60)
Glucose, Bld: 96 mg/dL (ref 65–99)
Potassium: 3.9 mmol/L (ref 3.5–5.1)
Sodium: 139 mmol/L (ref 135–145)
Total Bilirubin: 0.6 mg/dL (ref 0.3–1.2)
Total Protein: 7.8 g/dL (ref 6.5–8.1)

## 2018-02-13 LAB — I-STAT BETA HCG BLOOD, ED (MC, WL, AP ONLY): I-stat hCG, quantitative: <5 m[IU]/mL (ref <5)

## 2018-02-13 LAB — CBG MONITORING, ED: Glucose-Capillary: 106 mg/dL — ABNORMAL HIGH (ref 65–99)

## 2018-02-13 LAB — I-STAT TROPONIN, ED: TROPONIN I, POC: 0 ng/mL (ref 0.00–0.08)

## 2018-02-13 LAB — PREGNANCY, URINE: PREG TEST UR: NEGATIVE

## 2018-02-13 LAB — PROTIME-INR
INR: 1.06
Prothrombin Time: 13.7 seconds (ref 11.4–15.2)

## 2018-02-13 LAB — ETHANOL

## 2018-02-13 LAB — SODIUM: Sodium: 142 mmol/L (ref 135–145)

## 2018-02-13 LAB — HEMOGLOBIN A1C
HEMOGLOBIN A1C: 5.3 % (ref 4.8–5.6)
MEAN PLASMA GLUCOSE: 105.41 mg/dL

## 2018-02-13 LAB — APTT: aPTT: 28 seconds (ref 24–36)

## 2018-02-13 MED ORDER — IOPAMIDOL (ISOVUE-370) INJECTION 76%
INTRAVENOUS | Status: AC
  Filled 2018-02-13: qty 50

## 2018-02-13 MED ORDER — PANTOPRAZOLE SODIUM 40 MG IV SOLR
40.0000 mg | Freq: Every day | INTRAVENOUS | Status: DC
  Administered 2018-02-13: 40 mg via INTRAVENOUS
  Filled 2018-02-13 (×2): qty 40

## 2018-02-13 MED ORDER — FERROUS SULFATE 325 (65 FE) MG PO TABS
325.0000 mg | ORAL_TABLET | Freq: Two times a day (BID) | ORAL | Status: DC
  Administered 2018-02-14: 325 mg via ORAL
  Filled 2018-02-13: qty 1

## 2018-02-13 MED ORDER — CLEVIDIPINE BUTYRATE 0.5 MG/ML IV EMUL
0.0000 mg/h | INTRAVENOUS | Status: DC
  Administered 2018-02-13 (×3): 21 mg/h via INTRAVENOUS
  Administered 2018-02-14: 8 mg/h via INTRAVENOUS
  Administered 2018-02-14: 6 mg/h via INTRAVENOUS
  Filled 2018-02-13 (×5): qty 50

## 2018-02-13 MED ORDER — AMLODIPINE BESYLATE 5 MG PO TABS
5.0000 mg | ORAL_TABLET | Freq: Every day | ORAL | Status: DC
  Administered 2018-02-14 – 2018-02-15 (×2): 5 mg via ORAL
  Filled 2018-02-13 (×2): qty 1

## 2018-02-13 MED ORDER — ACETAMINOPHEN 650 MG RE SUPP: 650.0000 mg | RECTAL | Status: DC | PRN

## 2018-02-13 MED ORDER — SENNOSIDES-DOCUSATE SODIUM 8.6-50 MG PO TABS
1.0000 | ORAL_TABLET | Freq: Two times a day (BID) | ORAL | Status: DC
  Administered 2018-02-14 – 2018-02-17 (×5): 1 via ORAL
  Filled 2018-02-13 (×7): qty 1

## 2018-02-13 MED ORDER — IOPAMIDOL (ISOVUE-370) INJECTION 76%
INTRAVENOUS | Status: AC
  Filled 2018-02-13: qty 100

## 2018-02-13 MED ORDER — STROKE: EARLY STAGES OF RECOVERY BOOK
Freq: Once | Status: DC
  Filled 2018-02-13: qty 1

## 2018-02-13 MED ORDER — ACETAMINOPHEN 325 MG PO TABS
650.0000 mg | ORAL_TABLET | ORAL | Status: DC | PRN
  Filled 2018-02-13: qty 2

## 2018-02-13 MED ORDER — IOPAMIDOL (ISOVUE-370) INJECTION 76%
50.0000 mL | Freq: Once | INTRAVENOUS | Status: AC | PRN
  Administered 2018-02-13: 50 mL via INTRAVENOUS

## 2018-02-13 MED ORDER — LABETALOL HCL 5 MG/ML IV SOLN
10.0000 mg | Freq: Once | INTRAVENOUS | Status: DC
  Filled 2018-02-13: qty 4

## 2018-02-13 MED ORDER — IOPAMIDOL (ISOVUE-370) INJECTION 76%: 50.0000 mL | Freq: Once | INTRAVENOUS | Status: DC | PRN

## 2018-02-13 MED ORDER — CLEVIDIPINE BUTYRATE 0.5 MG/ML IV EMUL
0.0000 mg/h | INTRAVENOUS | Status: DC
  Administered 2018-02-13 (×2): 21 mg/h via INTRAVENOUS
  Administered 2018-02-13: 1 mg/h via INTRAVENOUS
  Administered 2018-02-13: 21 mg/h via INTRAVENOUS
  Filled 2018-02-13 (×5): qty 50

## 2018-02-13 MED ORDER — SODIUM CHLORIDE 3 % IV SOLN
INTRAVENOUS | Status: AC
  Administered 2018-02-13 (×2): 50 mL/h via INTRAVENOUS
  Administered 2018-02-14: 75 mL/h via INTRAVENOUS
  Filled 2018-02-13 (×6): qty 500

## 2018-02-13 MED ORDER — ACETAMINOPHEN 160 MG/5ML PO SOLN: 650.0000 mg | ORAL | Status: DC | PRN

## 2018-02-13 MED ORDER — LISINOPRIL 20 MG PO TABS
20.0000 mg | ORAL_TABLET | Freq: Every day | ORAL | Status: DC
  Administered 2018-02-14 – 2018-02-15 (×2): 20 mg via ORAL
  Filled 2018-02-13 (×2): qty 1

## 2018-02-13 NOTE — Progress Notes (Signed)
PT Cancellation Note  Patient Details Name: Jocelyn [name] MRN: [id number] DOB: 07-Dec-1965   Cancelled Treatment:    Reason Eval/Treat Not Completed: Active bedrest order   [name] [name] 02/13/2018, 3:10 PM  Delaney MeigsMaija Tabor [name], PT [phone]

## 2018-02-13 NOTE — Code Documentation (Signed)
52 yo female coming from home. Pt was seen by her friend on Saturday evening. Pt reports being normal unitl last night at 1900 when she started to have left-sided weakness, slurred speech, and left facial droop. Pt could not get to the phone. Family called and came to check on her this morning after she did not answer. Pt was found on the floor by family and EMS called. EMS activated Code Stroke. Stroke Team met pt at the bridge. NIHSS 11. Pt dysarthic, left facial droop, left arm weakness, and left leg weakness. Alert and Oriented x4. Pt has hx of HTN that has not been treated. Right ICH noted on the CT. Cleviprex started to control BP. Pt admitted to [facility].

## 2018-02-13 NOTE — ED Triage Notes (Signed)
PT found down on floor by friend this am.  AO x 4.  lSN by family on Sat.  Pt states was normal when she went to bed last night.  EMS feels pt was laying on floor last night.  Slurred speech, L sided flaccidity.  Hypertensive.

## 2018-02-13 NOTE — H&P (Addendum)
Admission H&P    Chief Complaint: Left-sided weakness and lethargy  HPI: Jocelyn [name] is a 52 y.o. female with a history of hypertension and anemia who lives alone and apparently was last known normal at approximately 6:30 PM on Sunday evening 02/12/2018.  A friend called her this morning; however, the patient did not answer the phone and her friend came to her house.  When she arrived she found the patient sitting on the floor next to her bed unable to get up.  EMS was called and the patient was brought to [facility] as a code stroke.  In route EMS noted left sided weakness, left facial droop, and slurred speech.  The patient was noted to be oriented.  On arrival to the emergency department the patient's exam was much the same.  She was somewhat drowsy. An NIH was 9.  A stat CT scan of the head was performed that revealed an intracerebral hemorrhage.  The patient states she was not taking any medications prior to admission.   LSN: 1830 hours on 02/12/2018. tPA Given:  No - ICH  Past Medical History Past Medical History:  Diagnosis Date  . Anemia   . Hypertension     Past Surgical History Past Surgical History:  Procedure Laterality Date  . MYOMECTOMY      Family History Family History  Problem Relation Age of Onset  . Diabetes Mellitus II Neg Hx   . Hypertension Neg Hx     Social History  reports that she has been smoking cigarettes.  She has a 2.00 pack-year smoking history. She has never used smokeless tobacco. She reports that she drinks alcohol. She reports that she does not use drugs. She is separated from her husband and lives alone.  She stated that she has no children.  Allergies No Known Allergies  Home Medications: The patient states she was not taking any medications prior to admission.   Hospital Medications .  stroke: mapping our early stages of recovery book   Does not apply Once  . amLODipine  5 mg Oral Daily  . ferrous sulfate  325 mg Oral BID WC   . iopamidol      . labetalol  10 mg Intravenous Once  . lisinopril  20 mg Oral Daily  . pantoprazole (PROTONIX) IV  40 mg Intravenous QHS  . senna-docusate  1 tablet Oral BID    ROS: The patient was too lethargic to give a review of systems.  She did deny having any other recent medical issues.    Physical Examination:  Vitals:   02/13/18 1210 02/13/18 1300 02/13/18 1400 02/13/18 1500  BP: (!) 148/101 123/69 120/67 123/70  Pulse: (!) 106 (!) 103 95 98  Resp: 15 20 16 17   Temp: 98.1 F (36.7 C)     TempSrc: Oral     SpO2: 100% 100% 100% 100%  Weight:  81.2 kg (179 lb 0.2 oz)    Height:  5\' 7"  (1.702 m)      General -52 year old female drowsy -opens eyes on command. Heart - Regular rate and rhythm - no murmer appreciated Lungs - Clear to auscultation anteriorly Abdomen - Soft - non tender Extremities - Distal pulses intact - no edema Skin - Warm and dry   Neurologic Examination:  Mental Status:  Oriented -naming intact.  Dysarthria noted.  Able to follow some commands. Cranial Nerves:  II-bilateral visual fields grossly intact III/IV/VI-Pupils were equal and reactive. Extraocular movements were full.  V/VII-no  facial numbness.  Slight left lower facial droop. VIII-hearing normal.  X-mild dysarthria. XII-midline tongue extension  Motor: Poor patient effort with muscle testing. Right : Upper extremity  3-4/5   Left:     Upper extremity   3/5  Lower extremity   3-4/5   Lower extremity  0/5 Increased flexor tone in the left upper extremity Sensory: Intact to light touch in all extremities. Deep Tendon Reflexes: Brisk in the left upper extremity and left lower extremity. Normal on the right.    Plantars: Downgoing bilaterally  Cerebellar: Unable to test due to the patient's inability to follow detailed commands. Gait: not tested   LABORATORY STUDIES   Basic Metabolic Panel: Recent Labs  Lab 02/13/18 1045 02/13/18 1123  NA 140 139  K 3.8 3.9  CL 103 103  CO2   --  17*  GLUCOSE 111* 96  BUN 14 12  CREATININE 0.60 0.69  CALCIUM  --  9.3    Liver Function Tests: Recent Labs  Lab 02/13/18 1123  AST 35  ALT 16  ALKPHOS 82  BILITOT 0.6  PROT 7.8  ALBUMIN 3.8   No results for input(s): LIPASE, AMYLASE in the last 168 hours. No results for input(s): AMMONIA in the last 168 hours.  CBC: Recent Labs  Lab 02/13/18 1045 02/13/18 1123  WBC  --  11.9*  NEUTROABS  --  10.2*  HGB 12.2 9.2*  HCT 36.0 33.4*  MCV  --  62.2*  PLT  --  462*    Cardiac Enzymes: Recent Labs  Lab 02/13/18 1324  CKTOTAL 1,541*  CKMB 14.3*    BNP: Invalid input(s): POCBNP  CBG: Recent Labs  Lab 02/13/18 1040  GLUCAP 106*    Microbiology:   Coagulation Studies: Recent Labs    02/13/18 1123  LABPROT 13.7  INR 1.06    Urinalysis:  Recent Labs  Lab 02/13/18 1235  COLORURINE YELLOW  LABSPEC 1.030  PHURINE 5.0  GLUCOSEU NEGATIVE  HGBUR LARGE*  BILIRUBINUR NEGATIVE  KETONESUR 5*  PROTEINUR >=300*  NITRITE NEGATIVE  LEUKOCYTESUR TRACE*    Lipid Panel:     Component Value Date/Time   CHOL 175 02/13/2018 1123   TRIG 26 02/13/2018 1123   HDL 55 02/13/2018 1123   CHOLHDL 3.2 02/13/2018 1123   VLDL 5 02/13/2018 1123   LDLCALC 115 (H) 02/13/2018 1123    HgbA1C:  Lab Results  Component Value Date   HGBA1C 5.3 02/13/2018    Urine Drug Screen:      Component Value Date/Time   LABOPIA NONE DETECTED 02/13/2018 1235   COCAINSCRNUR NONE DETECTED 02/13/2018 1235   LABBENZ NONE DETECTED 02/13/2018 1235   AMPHETMU POSITIVE (A) 02/13/2018 1235   THCU NONE DETECTED 02/13/2018 1235   LABBARB NONE DETECTED 02/13/2018 1235     Alcohol Level:  Recent Labs  Lab 02/13/18 1123  ETH <10    Miscellaneous Labs:  EKG: sinus rhythm rate 68 bpm.  No obvious abnormalities.  Please see carddiology interpretation.   IMAGING  Dg Chest Port 1 View 02/13/2018 IMPRESSION:  No edema or consolidation.    Ct Head Code Stroke Wo Contrast   02/13/2018 IMPRESSION:  Acute right basal ganglia hemorrhage with mild edema and 4 mm of leftward midline shift.    Assessment: 52 y.o. female with a history of hypertension, anemia, and tobacco use found in her home by a friend this morning after she did not answer the telephone. She was poorly responsive and had left-sided weakness. She  has a previous history of hypertension but states that she was on no medications prior to admission.  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  1. 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 2. Hemorrhage Risk Factors - hypertension and ongoing tobacco use  Plan:  1. Admit to ICU under Neurology service 2. MRI/MRA of head 3. Carotid ultrasound 4. TTE 5. PT consult, OT consult, Speech consult 6. Cardiac telemetry 7. Frequent neuro checks 8. Hypertonic saline 3% at 50 cc/hr 9. BP management with clevidipine drip. SBP goal < 140 10. PRN labetalol 11. Lisinopril and Norvasc started as baseline antihypertensive agents to prevent rebound after weaning off clevidipine. 12. No antiplatelet medications or anticoagulants 13. Repeat CT head in 12 hours  55 minutes spent in the emergent Neurological evaluation and management of this critically ill patient.   Electronically signed: Dr. Caryl PinaEric Mattingly Fountaine 02/13/2018, 3:24 PM

## 2018-02-13 NOTE — ED Provider Notes (Signed)
[facility] EMERGENCY DEPARTMENT Provider Note   CSN: [id number] Arrival date & time: 02/13/18  1029   An emergency department physician performed an initial assessment on this suspected stroke patient at 1030.  History   Chief Complaint No chief complaint on file.   Level 5 caveat: Code stroke, dysarthria  HPI Jocelyn [name] is a 52 y.o. female.  HPI Patient is a 52 year old female with a history of hypertension who is brought to the emergency department after being found on the floor in her house with left-sided weakness and altered mental status and abnormal speech.  Brought to the emergency department as a code stroke.  No prior history of stroke or seizures.  Patient denies headache at this time.  She was last seen normal yesterday evening by a friend.  She was found by a friend this morning when she would not answer her phone.  No history of seizures.  No seizure activity noted by EMS.   Past Medical History:  Diagnosis Date  . Anemia   . Hypertension     Patient Active Problem List   Diagnosis Date Noted  .  02/13/2018  . Pneumonia, community acquired 09/23/2015  . Microcytic hypochromic anemia 09/23/2015  . Tobacco abuse 09/23/2015  . Pneumonia 09/23/2015  . Nicotine addiction 12/09/2013  . Microcytic anemia 12/09/2013  . HTN (hypertension) 12/09/2013  . BMI 36.0-36.9,adult 12/09/2013    Past Surgical History:  Procedure Laterality Date  . MYOMECTOMY       OB History   None      Home Medications    Prior to Admission medications   Medication Sig Start Date End Date Taking? Authorizing Provider  ferrous sulfate 325 (65 FE) MG tablet Take 1 tablet (325 mg total) by mouth 2 (two) times daily with a meal. Patient not taking: Reported on 07/21/2017 09/26/15   Albertine Grates, MD  hydrochlorothiazide (MICROZIDE) 12.5 MG capsule Take 1 capsule (12.5 mg total) by mouth daily. Patient not taking: Reported on 07/21/2017 11/25/15   Lenell Antu, DO     Family History Family History  Problem Relation Age of Onset  . Diabetes Mellitus II Neg Hx   . Hypertension Neg Hx     Social History Social History   Tobacco Use  . Smoking status: Current Some Day Smoker    Packs/day: 0.40    Years: 5.00    Pack years: 2.00    Types: Cigarettes  . Smokeless tobacco: Never Used  Substance Use Topics  . Alcohol use: Yes    Alcohol/week: 0.0 oz  . Drug use: No     Allergies   Patient has no known allergies.   Review of Systems Review of Systems  Unable to perform ROS: Acuity of condition     Physical Exam Updated Vital Signs There were no vitals taken for this visit.  Physical Exam  Constitutional: She appears well-developed and well-nourished. No distress.  HENT:  Head: Normocephalic and atraumatic.  Eyes: EOM are normal.  Neck: Normal range of motion.  Cardiovascular: Normal rate, regular rhythm and normal heart sounds.  Pulmonary/Chest: Effort normal and breath sounds normal.  Abdominal: Soft. She exhibits no distension. There is no tenderness.  Neurological:  Opens eyes to voice. Follows simple commands. Mild weakness of the left hand and left foot, although with some increased tone in the proximal muscles on the left  Skin: Skin is warm and dry.  Psychiatric: She has a normal mood and affect. Judgment normal.  Nursing note and vitals reviewed.    ED Treatments / Results  Labs (all labs ordered are listed, but only abnormal results are displayed) Labs Reviewed  CBG MONITORING, ED - Abnormal; Notable for the following components:      Result Value   Glucose-Capillary 106 (*)    All other components within normal limits  I-STAT CHEM 8, ED - Abnormal; Notable for the following components:   Glucose, Bld 111 (*)    Calcium, Ion 1.12 (*)    All other components within normal limits  PROTIME-INR  APTT  CBC  DIFFERENTIAL  COMPREHENSIVE METABOLIC PANEL  HIV ANTIBODY (ROUTINE TESTING)  HEMOGLOBIN A1C  LIPID  PANEL  PREGNANCY, URINE  URINALYSIS, ROUTINE W REFLEX MICROSCOPIC  SODIUM  SODIUM  SODIUM  I-STAT TROPONIN, ED  I-STAT BETA HCG BLOOD, ED ([facility], [facility], [facility] ONLY)    EKG None  Radiology Ct Head Code Stroke Wo Contrast  Result Date: 02/13/2018 CLINICAL DATA:  Code stroke.  Left-sided weakness. EXAM: CT HEAD WITHOUT CONTRAST TECHNIQUE: Contiguous axial images were obtained from the base of the skull through the vertex without intravenous contrast. COMPARISON:  None. FINDINGS: Brain: An acute parenchymal hemorrhage centered in the lateral aspect of the right lentiform nucleus/external capsule measures 3.9 x 2.0 x 2.7 cm (approximate volume of 11 cc). There is mild surrounding edema with mass effect on the basal ganglia and right lateral ventricle. Leftward midline shift measures 4 mm. There is no ventriculomegaly. No acute large territory infarct is identified separate from the hemorrhage. Cerebral volume is within normal limits for age. There is no extra-axial fluid collection. Vascular: No hyperdense vessel. Skull: No fracture focal osseous lesion. Sinuses/Orbits: Visualized paranasal sinuses and mastoid air cells are clear. Orbits are unremarkable. Other: None. ASPECTS [facility] Stroke Program Early CT Score) Not scored due to the presence of hemorrhage. 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 Critical Value/emergent results were called by telephone at the time of interpretation on 02/13/2018 at 11:01 am to Dr. Caryl Pina, who verbally acknowledged these results. Electronically Signed   By: Sebastian Ache M.D.   On: 02/13/2018 11:01    Procedures .Critical Care Performed by: Azalia Bilis, MD Authorized by: Azalia Bilis, MD    .CRITICAL CARE Performed by: Azalia Bilis Total critical care time: 31 minutes Critical care time was exclusive of separately billable procedures and treating other patients. Critical care was necessary to treat or  prevent imminent or life-threatening deterioration. Critical care was time spent personally by me on the following activities: development of treatment plan with patient and/or surrogate as well as nursing, discussions with consultants, evaluation of patient's response to treatment, examination of patient, obtaining history from patient or surrogate, ordering and performing treatments and interventions, ordering and review of laboratory studies, ordering and review of radiographic studies, pulse oximetry and re-evaluation of patient's condition.   Medications Ordered in ED Medications  iopamidol (ISOVUE-370) 76 % injection 50 mL (has no administration in time range)  iopamidol (ISOVUE-370) 76 % injection (has no administration in time range)   stroke: mapping our early stages of recovery book (has no administration in time range)  acetaminophen (TYLENOL) tablet 650 mg (has no administration in time range)    Or  acetaminophen (TYLENOL) solution 650 mg (has no administration in time range)    Or  acetaminophen (TYLENOL) suppository 650 mg (has no administration in time range)  senna-docusate (Senokot-S) tablet 1 tablet (has no administration in  time range)  pantoprazole (PROTONIX) injection 40 mg (has no administration in time range)  clevidipine (CLEVIPREX) infusion 0.5 mg/mL (has no administration in time range)  sodium chloride (hypertonic) 3 % solution (has no administration in time range)  ferrous sulfate tablet 325 mg (has no administration in time range)  lisinopril (PRINIVIL,ZESTRIL) tablet 20 mg (has no administration in time range)     Initial Impression / Assessment and Plan / ED Course  I have reviewed the triage vital signs and the nursing notes.  Pertinent labs & imaging results that were available during my care of the patient were reviewed by me and considered in my medical decision making (see chart for details).     Patient presents as a code stroke.  She appears to  have a right-sided intracerebral hematoma on acute imaging.  No significant hydrocephalus noted at this time.  Patient will be admitted to the neuro intensive care unit.  Family will be updated when they arrive.  Patient is alert.  She is protecting her airway.  She is informed of her diagnosis.  She is able to squeeze some on the left hand.  No seizure activity noted at this time.  We will continue to observe closely while in the resuscitation room.   Final Clinical Impressions(s) / ED Diagnoses   Final diagnoses:  ICH (intracerebral hemorrhage) [facility](HCC)    ED Discharge Orders    None       Azalia Bilisampos, Topaz Raglin, MD 02/13/18 1127

## 2018-02-13 NOTE — ED Notes (Signed)
Family at bedside. 

## 2018-02-13 NOTE — Progress Notes (Signed)
Cortrak Tube Team Note:  Consult received to place a Cortrak feeding tube.   A 10 F Cortrak tube was placed in the LEFT nare and secured with a nasal bridle at 68 cm. Per the Cortrak monitor reading the tube tip is stomach.   No x-ray is required. RN may begin using tube.   If the tube becomes dislodged please keep the tube and contact the Cortrak team at [email or web address] ([email or web address]) for replacement.  If after hours and replacement cannot be delayed, place a NG tube and confirm placement with an abdominal x-ray.    Romelle Starcherate Iyana Topor MS, RD, LDN, CNSC [phone] Pager  [phone] Weekend/On-Call Pager

## 2018-02-13 NOTE — ED Notes (Signed)
Dr Otelia LimesLindzen assessing pt.

## 2018-02-13 NOTE — H&P (Deleted)
Admission H&P    Chief Complaint: Left-sided weakness and lethargy HPI: Jocelyn [name] is a 52 y.o. female with a history of hypertension and anemia who lives alone and apparently was last known normal at approximately 6:30 PM on Sunday evening 02/12/2018.  A friend called her this morning; however, the patient did not answer the phone and her friend came to her house.  When she arrived she found the patient sitting on the floor next to her bed unable to get up.  EMS was called and the patient was brought to [facility] as a code stroke.  In route EMS noted left sided weakness, left facial droop, and slurred speech.  The patient was noted to be oriented.  On arrival to the emergency department the patient's exam was much the same.  She was somewhat drowsy. An NIH was 9.  A stat CT scan of the head was performed that revealed an intracerebral hemorrhage.  The patient states she was not taking any medications prior to admission.   LSN: 1830 hours on 02/12/2018. tPA Given:  No - ICH  Past Medical History Past Medical History:  Diagnosis Date  . Anemia   . Hypertension     Past Surgical History Past Surgical History:  Procedure Laterality Date  . MYOMECTOMY      Family History Family History  Problem Relation Age of Onset  . Diabetes Mellitus II Neg Hx   . Hypertension Neg Hx     Social History  reports that she has been smoking cigarettes.  She has a 2.00 pack-year smoking history. She has never used smokeless tobacco. She reports that she drinks alcohol. She reports that she does not use drugs. She is separated from her husband and lives alone.  She stated that she has no children.  Allergies No Known Allergies  Home Medications: The patient states she was not taking any medications prior to admission.   Hospital Medications .  stroke: mapping our early stages of recovery book   Does not apply Once  . amLODipine  5 mg Oral Daily  . ferrous sulfate  325 mg Oral BID WC  .  iopamidol      . lisinopril  20 mg Oral Daily  . pantoprazole (PROTONIX) IV  40 mg Intravenous QHS  . senna-docusate  1 tablet Oral BID    ROS: The patient was too lethargic to give a review of systems.  She did deny having any other recent medical issues.    Physical Examination:  Vitals:   02/13/18 1138 02/13/18 1139 02/13/18 1140 02/13/18 1142  BP: 129/70  127/74 130/78  Pulse: 96 96 99 94  Resp: 16 17 19 16   Temp:      TempSrc:      SpO2:      Weight:      Height:        General -52 year old female drowsy -opens eyes on command. Heart - Regular rate and rhythm - no murmer appreciated Lungs - Clear to auscultation anteriorly Abdomen - Soft - non tender Extremities - Distal pulses intact - no edema Skin - Warm and dry   Neurologic Examination:  Mental Status:  Oriented -naming intact.  Dysarthria noted.  Able to follow some commands. Cranial Nerves:  II-bilateral visual fields grossly intact III/IV/VI-Pupils were equal and reacted. Extraocular movements were full.  V/VII-no facial numbness.  Slight left lower facial droop. VIII-hearing normal.  X-mild dysarthria. XII-midline tongue extension  Motor: Poor patient effort with muscle  testing. Right : Upper extremity  3-4/5   Left:     Upper extremity   3/5  Lower extremity   3-4/5   Lower extremity  05/5 Increased flexor tone in the left upper extremity Sensory: Intact to light touch in all extremities. Deep Tendon Reflexes: Brisk in the left upper extremity.  Otherwise normal throughout Plantars: Downgoing bilaterally  Cerebellar: Unable to test due to the patient's inability to follow commands. Gait: not tested   LABORATORY STUDIES   Basic Metabolic Panel: Recent Labs  Lab 02/13/18 1045  NA 140  K 3.8  CL 103  GLUCOSE 111*  BUN 14  CREATININE 0.60    Liver Function Tests: No results for input(s): AST, ALT, ALKPHOS, BILITOT, PROT, ALBUMIN in the last 168 hours. No results for input(s): LIPASE,  AMYLASE in the last 168 hours. No results for input(s): AMMONIA in the last 168 hours.  CBC: Recent Labs  Lab 02/13/18 1045  HGB 12.2  HCT 36.0    Cardiac Enzymes: No results for input(s): CKTOTAL, CKMB, CKMBINDEX, TROPONINI in the last 168 hours.  BNP: Invalid input(s): POCBNP  CBG: Recent Labs  Lab 02/13/18 1040  GLUCAP 106*    Microbiology:   Coagulation Studies: No results for input(s): LABPROT, INR in the last 72 hours.  Urinalysis: No results for input(s): COLORURINE, LABSPEC, PHURINE, GLUCOSEU, HGBUR, BILIRUBINUR, KETONESUR, PROTEINUR, UROBILINOGEN, NITRITE, LEUKOCYTESUR in the last 168 hours.  Invalid input(s): APPERANCEUR  Lipid Panel:  No results found for: CHOL, TRIG, HDL, CHOLHDL, VLDL, LDLCALC  HgbA1C:  No results found for: HGBA1C  Urine Drug Screen:  No results found for: LABOPIA, COCAINSCRNUR, LABBENZ, AMPHETMU, THCU, LABBARB   Alcohol Level:  No results for input(s): ETH in the last 168 hours.  Miscellaneous Labs:  EKG: sinus rhythm rate 68 bpm.  No obvious abnormalities.  Please see carddiology interpretation.     IMAGING  Dg Chest Port 1 View 02/13/2018 IMPRESSION:  No edema or consolidation.    Ct Head Code Stroke Wo Contrast  02/13/2018 IMPRESSION:  Acute right basal ganglia hemorrhage with mild edema and 4 mm of leftward midline shift.     Assessment: 52 y.o. female with a history of hypertension, anemia, and tobacco use found in her home by a friend this morning after she did not answer the telephone. She was poorly responsive and had left-sided weakness. She has a previous history of hypertension but states that she was on no medications prior to admission.  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  Stroke Risk Factors - hypertension and ongoing tobacco use  Plan: The patient will be admitted to the neuro intensive care unit for close monitoring and frequent neuro checks.   Attending Neurologist's Note  to Follow  Delton Seeavid Kanylah Muench PA-C [facility] Pager [phone] 02/13/2018, 11:51 AM

## 2018-02-14 ENCOUNTER — Inpatient Hospital Stay (HOSPITAL_COMMUNITY): Payer: 59

## 2018-02-14 DIAGNOSIS — I10 Essential (primary) hypertension: Secondary | ICD-10-CM

## 2018-02-14 DIAGNOSIS — F17200 Nicotine dependence, unspecified, uncomplicated: Secondary | ICD-10-CM

## 2018-02-14 LAB — VITAMIN B12: VITAMIN B 12: 169 pg/mL — AB (ref 180–914)

## 2018-02-14 LAB — BASIC METABOLIC PANEL
Anion gap: 9 (ref 5–15)
BUN: 6 mg/dL (ref 6–20)
CHLORIDE: 114 mmol/L — AB (ref 101–111)
CO2: 23 mmol/L (ref 22–32)
Calcium: 8.5 mg/dL — ABNORMAL LOW (ref 8.9–10.3)
Creatinine, Ser: 0.8 mg/dL (ref 0.44–1.00)
GFR calc non Af Amer: >60 mL/min (ref >60)
Glucose, Bld: 100 mg/dL — ABNORMAL HIGH (ref 65–99)
POTASSIUM: 3.3 mmol/L — AB (ref 3.5–5.1)
Sodium: 146 mmol/L — ABNORMAL HIGH (ref 135–145)

## 2018-02-14 LAB — SODIUM
SODIUM: 143 mmol/L (ref 135–145)
Sodium: 140 mmol/L (ref 135–145)
Sodium: 142 mmol/L (ref 135–145)
Sodium: 143 mmol/L (ref 135–145)

## 2018-02-14 LAB — TSH: TSH: 2.189 u[IU]/mL (ref 0.350–4.500)

## 2018-02-14 LAB — ECHOCARDIOGRAM COMPLETE
Height: 67 in
Weight: 2864.22 oz

## 2018-02-14 LAB — CBC
HEMATOCRIT: 30.2 % — AB (ref 36.0–46.0)
HEMOGLOBIN: 8.3 g/dL — AB (ref 12.0–15.0)
MCH: 17 pg — ABNORMAL LOW (ref 26.0–34.0)
MCHC: 27.5 g/dL — ABNORMAL LOW (ref 30.0–36.0)
MCV: 61.8 fL — ABNORMAL LOW (ref 78.0–100.0)
Platelets: 490 10*3/uL — ABNORMAL HIGH (ref 150–400)
RBC: 4.89 MIL/uL (ref 3.87–5.11)
RDW: 21.6 % — ABNORMAL HIGH (ref 11.5–15.5)
WBC: 8.5 10*3/uL (ref 4.0–10.5)

## 2018-02-14 LAB — HEMOGLOBIN: Hemoglobin: 8.4 g/dL — ABNORMAL LOW (ref 12.0–15.0)

## 2018-02-14 LAB — IRON AND TIBC
Iron: 9 ug/dL — ABNORMAL LOW (ref 28–170)
SATURATION RATIOS: 3 % — AB (ref 10.4–31.8)
TIBC: 350 ug/dL (ref 250–450)
UIBC: 341 ug/dL

## 2018-02-14 LAB — HIV ANTIBODY (ROUTINE TESTING W REFLEX): HIV Screen 4th Generation wRfx: NONREACTIVE

## 2018-02-14 LAB — FERRITIN: Ferritin: 5 ng/mL — ABNORMAL LOW (ref 11–307)

## 2018-02-14 MED ORDER — FERROUS SULFATE 325 (65 FE) MG PO TABS
325.0000 mg | ORAL_TABLET | Freq: Three times a day (TID) | ORAL | Status: DC
  Administered 2018-02-15 – 2018-02-17 (×9): 325 mg via ORAL
  Filled 2018-02-14 (×9): qty 1

## 2018-02-14 MED ORDER — POTASSIUM CHLORIDE CRYS ER 20 MEQ PO TBCR
40.0000 meq | EXTENDED_RELEASE_TABLET | ORAL | Status: AC
  Administered 2018-02-14 (×2): 40 meq via ORAL
  Filled 2018-02-14 (×2): qty 2

## 2018-02-14 MED ORDER — VITAMIN B-12 1000 MCG PO TABS
1000.0000 ug | ORAL_TABLET | Freq: Every day | ORAL | Status: DC
  Administered 2018-02-14 – 2018-02-17 (×4): 1000 ug via ORAL
  Filled 2018-02-14 (×4): qty 1

## 2018-02-14 MED ORDER — CYANOCOBALAMIN 1000 MCG/ML IJ SOLN
1000.0000 ug | Freq: Once | INTRAMUSCULAR | Status: AC
  Administered 2018-02-15: 1000 ug via INTRAMUSCULAR
  Filled 2018-02-14 (×2): qty 1

## 2018-02-14 NOTE — Progress Notes (Signed)
STROKE TEAM PROGRESS NOTE   SUBJECTIVE (INTERVAL HISTORY) Her sister is at the bedside.  Overall she feels her condition is stable. Repeat CT showed stable hematoma and midline shift. CTA head and neck no AVM or aneurysm. BP stable on cleviprex. Passed swallow. Taper off 3% saline. She denies any HA.    OBJECTIVE Temp:  [98.1 F (36.7 C)-99.1 F (37.3 C)] 98.9 F (37.2 C) (04/09 0800) Pulse Rate:  [69-107] 97 (04/09 0900) Resp:  [11-22] 14 (04/09 0900) BP: (106-172)/(59-115) 145/80 (04/09 0900) SpO2:  [97 %-100 %] 100 % (04/09 0900) Weight:  [177 lb (80.3 kg)-179 lb 0.2 oz (81.2 kg)] 179 lb 0.2 oz (81.2 kg) (04/08 1300)  Recent Labs  Lab 02/13/18 1040  GLUCAP 106*   Recent Labs  Lab 02/13/18 1045 02/13/18 1123 02/13/18 1634 02/13/18 2339 02/14/18 0613  NA 140 139 142 140 146*  K 3.8 3.9  --   --  3.3*  CL 103 103  --   --  114*  CO2  --  17*  --   --  23  GLUCOSE 111* 96  --   --  100*  BUN 14 12  --   --  6  CREATININE 0.60 0.69  --   --  0.80  CALCIUM  --  9.3  --   --  8.5*   Recent Labs  Lab 02/13/18 1123  AST 35  ALT 16  ALKPHOS 82  BILITOT 0.6  PROT 7.8  ALBUMIN 3.8   Recent Labs  Lab 02/13/18 1045 02/13/18 1123 02/14/18 0613  WBC  --  11.9* 8.5  NEUTROABS  --  10.2*  --   HGB 12.2 9.2* 8.3*  HCT 36.0 33.4* 30.2*  MCV  --  62.2* 61.8*  PLT  --  462* 490*   Recent Labs  Lab 02/13/18 1324  CKTOTAL 1,541*  CKMB 14.3*   Recent Labs    02/13/18 1123  LABPROT 13.7  INR 1.06   Recent Labs    02/13/18 1235  COLORURINE YELLOW  LABSPEC 1.030  PHURINE 5.0  GLUCOSEU NEGATIVE  HGBUR LARGE*  BILIRUBINUR NEGATIVE  KETONESUR 5*  PROTEINUR >=300*  NITRITE NEGATIVE  LEUKOCYTESUR TRACE*       Component Value Date/Time   CHOL 175 02/13/2018 1123   TRIG 26 02/13/2018 1123   HDL 55 02/13/2018 1123   CHOLHDL 3.2 02/13/2018 1123   VLDL 5 02/13/2018 1123   LDLCALC 115 (H) 02/13/2018 1123   Lab Results  Component Value Date   HGBA1C 5.3  02/13/2018      Component Value Date/Time   LABOPIA NONE DETECTED 02/13/2018 1235   COCAINSCRNUR NONE DETECTED 02/13/2018 1235   LABBENZ NONE DETECTED 02/13/2018 1235   AMPHETMU POSITIVE (A) 02/13/2018 1235   THCU NONE DETECTED 02/13/2018 1235   LABBARB NONE DETECTED 02/13/2018 1235    Recent Labs  Lab 02/13/18 1123  ETH <10    I have personally reviewed the radiological images below and agree with the radiology interpretations.  Ct Angio Head W Or Wo Contrast  Result Date: 02/14/2018 CLINICAL DATA:  52 y/o F; stroke for follow-up, left-sided weakness. EXAM: CT ANGIOGRAPHY HEAD AND NECK CT HEAD WITHOUT CONTRAST TECHNIQUE: Multidetector CT imaging of the head and neck was performed using the standard protocol during bolus administration of intravenous contrast. Multiplanar CT image reconstructions and MIPs were obtained to evaluate the vascular anatomy. Carotid stenosis measurements (when applicable) are obtained utilizing NASCET criteria, using the distal internal carotid diameter  as the denominator. CONTRAST:  50mL ISOVUE-370 IOPAMIDOL (ISOVUE-370) INJECTION 76% COMPARISON:  02/13/2018 CT head. FINDINGS: CT HEAD FINDINGS Brain: Stable acute hemorrhage within the right basal ganglia measuring 3.7 x 2.0 cm (series 14, image 13). Stable surrounding edema and associated mass effect with partial effacement of right lateral ventricle and 4 mm of right-to-left midline shift. No new acute intracranial hemorrhage, large acute stroke, or focal mass effect of the brain. No extra-axial collection, effacement of basilar cisterns, or herniation. Vascular: As below. Skull: Normal. Negative for fracture or focal lesion. Sinuses: Imaged portions are clear. Orbits: No acute finding. Review of the MIP images confirms the above findings CTA NECK FINDINGS Aortic arch: Standard branching. Imaged portion shows no evidence of aneurysm or dissection. No significant stenosis of the major arch vessel origins. Right  carotid system: No evidence of dissection, stenosis (50% or greater) or occlusion. Left carotid system: No evidence of dissection, stenosis (50% or greater) or occlusion. Vertebral arteries: Left dominant. No evidence of dissection, stenosis (50% or greater) or occlusion. Skeleton: Mild cervical spondylosis. No high-grade bony canal stenosis. Other neck: Negative. Upper chest: Negative. Review of the MIP images confirms the above findings CTA HEAD FINDINGS Anterior circulation: No significant stenosis, proximal occlusion, aneurysm, or vascular malformation. Posterior circulation: No significant stenosis, proximal occlusion, aneurysm, or vascular malformation. Venous sinuses: As permitted by contrast timing, patent. Anatomic variants: Right fetal PCA. Right vertebral artery ends in the right PICA. Delayed phase: No abnormal intracranial enhancement. Review of the MIP images confirms the above findings IMPRESSION: 1. Patent carotid and vertebral arteries. No dissection, aneurysm, or hemodynamically significant stenosis utilizing NASCET criteria. 2. Patent anterior and posterior intracranial circulation. No large vessel occlusion, aneurysm, or significant stenosis. No vascular malformation. 3. Stable hematoma in right basal ganglia measuring up to 3.7 cm. Stable associated edema and mass effect with 4 mm right-to-left midline shift. No new acute intracranial abnormality identified. Electronically Signed   By: Mitzi HansenLance  Furusawa-Stratton M.D.   On: 02/14/2018 01:13   Ct Head Wo Contrast  Result Date: 02/14/2018 CLINICAL DATA:  52 y/o F; stroke for follow-up, left-sided weakness. EXAM: CT ANGIOGRAPHY HEAD AND NECK CT HEAD WITHOUT CONTRAST TECHNIQUE: Multidetector CT imaging of the head and neck was performed using the standard protocol during bolus administration of intravenous contrast. Multiplanar CT image reconstructions and MIPs were obtained to evaluate the vascular anatomy. Carotid stenosis measurements (when  applicable) are obtained utilizing NASCET criteria, using the distal internal carotid diameter as the denominator. CONTRAST:  50mL ISOVUE-370 IOPAMIDOL (ISOVUE-370) INJECTION 76% COMPARISON:  02/13/2018 CT head. FINDINGS: CT HEAD FINDINGS Brain: Stable acute hemorrhage within the right basal ganglia measuring 3.7 x 2.0 cm (series 14, image 13). Stable surrounding edema and associated mass effect with partial effacement of right lateral ventricle and 4 mm of right-to-left midline shift. No new acute intracranial hemorrhage, large acute stroke, or focal mass effect of the brain. No extra-axial collection, effacement of basilar cisterns, or herniation. Vascular: As below. Skull: Normal. Negative for fracture or focal lesion. Sinuses: Imaged portions are clear. Orbits: No acute finding. Review of the MIP images confirms the above findings CTA NECK FINDINGS Aortic arch: Standard branching. Imaged portion shows no evidence of aneurysm or dissection. No significant stenosis of the major arch vessel origins. Right carotid system: No evidence of dissection, stenosis (50% or greater) or occlusion. Left carotid system: No evidence of dissection, stenosis (50% or greater) or occlusion. Vertebral arteries: Left dominant. No evidence of dissection, stenosis (50% or greater) or  occlusion. Skeleton: Mild cervical spondylosis. No high-grade bony canal stenosis. Other neck: Negative. Upper chest: Negative. Review of the MIP images confirms the above findings CTA HEAD FINDINGS Anterior circulation: No significant stenosis, proximal occlusion, aneurysm, or vascular malformation. Posterior circulation: No significant stenosis, proximal occlusion, aneurysm, or vascular malformation. Venous sinuses: As permitted by contrast timing, patent. Anatomic variants: Right fetal PCA. Right vertebral artery ends in the right PICA. Delayed phase: No abnormal intracranial enhancement. Review of the MIP images confirms the above findings IMPRESSION:  1. Patent carotid and vertebral arteries. No dissection, aneurysm, or hemodynamically significant stenosis utilizing NASCET criteria. 2. Patent anterior and posterior intracranial circulation. No large vessel occlusion, aneurysm, or significant stenosis. No vascular malformation. 3. Stable hematoma in right basal ganglia measuring up to 3.7 cm. Stable associated edema and mass effect with 4 mm right-to-left midline shift. No new acute intracranial abnormality identified. Electronically Signed   By: Mitzi Hansen M.D.   On: 02/14/2018 01:13   Ct Angio Neck W Or Wo Contrast  Result Date: 02/14/2018 CLINICAL DATA:  51 y/o F; stroke for follow-up, left-sided weakness. EXAM: CT ANGIOGRAPHY HEAD AND NECK CT HEAD WITHOUT CONTRAST TECHNIQUE: Multidetector CT imaging of the head and neck was performed using the standard protocol during bolus administration of intravenous contrast. Multiplanar CT image reconstructions and MIPs were obtained to evaluate the vascular anatomy. Carotid stenosis measurements (when applicable) are obtained utilizing NASCET criteria, using the distal internal carotid diameter as the denominator. CONTRAST:  50mL ISOVUE-370 IOPAMIDOL (ISOVUE-370) INJECTION 76% COMPARISON:  02/13/2018 CT head. FINDINGS: CT HEAD FINDINGS Brain: Stable acute hemorrhage within the right basal ganglia measuring 3.7 x 2.0 cm (series 14, image 13). Stable surrounding edema and associated mass effect with partial effacement of right lateral ventricle and 4 mm of right-to-left midline shift. No new acute intracranial hemorrhage, large acute stroke, or focal mass effect of the brain. No extra-axial collection, effacement of basilar cisterns, or herniation. Vascular: As below. Skull: Normal. Negative for fracture or focal lesion. Sinuses: Imaged portions are clear. Orbits: No acute finding. Review of the MIP images confirms the above findings CTA NECK FINDINGS Aortic arch: Standard branching. Imaged portion shows  no evidence of aneurysm or dissection. No significant stenosis of the major arch vessel origins. Right carotid system: No evidence of dissection, stenosis (50% or greater) or occlusion. Left carotid system: No evidence of dissection, stenosis (50% or greater) or occlusion. Vertebral arteries: Left dominant. No evidence of dissection, stenosis (50% or greater) or occlusion. Skeleton: Mild cervical spondylosis. No high-grade bony canal stenosis. Other neck: Negative. Upper chest: Negative. Review of the MIP images confirms the above findings CTA HEAD FINDINGS Anterior circulation: No significant stenosis, proximal occlusion, aneurysm, or vascular malformation. Posterior circulation: No significant stenosis, proximal occlusion, aneurysm, or vascular malformation. Venous sinuses: As permitted by contrast timing, patent. Anatomic variants: Right fetal PCA. Right vertebral artery ends in the right PICA. Delayed phase: No abnormal intracranial enhancement. Review of the MIP images confirms the above findings IMPRESSION: 1. Patent carotid and vertebral arteries. No dissection, aneurysm, or hemodynamically significant stenosis utilizing NASCET criteria. 2. Patent anterior and posterior intracranial circulation. No large vessel occlusion, aneurysm, or significant stenosis. No vascular malformation. 3. Stable hematoma in right basal ganglia measuring up to 3.7 cm. Stable associated edema and mass effect with 4 mm right-to-left midline shift. No new acute intracranial abnormality identified. Electronically Signed   By: Mitzi Hansen M.D.   On: 02/14/2018 01:13   Dg Chest [facility]  Result Date: 02/13/2018 CLINICAL DATA:  Intracranial hemorrhage.  Hypertension. EXAM: PORTABLE CHEST 1 VIEW COMPARISON:  November 05, 2015 FINDINGS: Lungs are clear. Heart size and pulmonary vascularity are normal. No adenopathy. No bone lesions. IMPRESSION: No edema or consolidation. Electronically Signed   By: Bretta Bang III  M.D.   On: 02/13/2018 11:32   Ct Head Code Stroke Wo Contrast  Result Date: 02/13/2018 CLINICAL DATA:  Code stroke.  Left-sided weakness. EXAM: CT HEAD WITHOUT CONTRAST TECHNIQUE: Contiguous axial images were obtained from the base of the skull through the vertex without intravenous contrast. COMPARISON:  None. FINDINGS: Brain: An acute parenchymal hemorrhage centered in the lateral aspect of the right lentiform nucleus/external capsule measures 3.9 x 2.0 x 2.7 cm (approximate volume of 11 cc). There is mild surrounding edema with mass effect on the basal ganglia and right lateral ventricle. Leftward midline shift measures 4 mm. There is no ventriculomegaly. No acute large territory infarct is identified separate from the hemorrhage. Cerebral volume is within normal limits for age. There is no extra-axial fluid collection. Vascular: No hyperdense vessel. Skull: No fracture focal osseous lesion. Sinuses/Orbits: Visualized paranasal sinuses and mastoid air cells are clear. Orbits are unremarkable. Other: None. ASPECTS [facility] Stroke Program Early CT Score) Not scored due to the presence of hemorrhage. 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 Critical Value/emergent results were called by telephone at the time of interpretation on 02/13/2018 at 11:01 am to Dr. Caryl Pina, who verbally acknowledged these results. Electronically Signed   By: Sebastian Ache M.D.   On: 02/13/2018 11:01    TTE pending   PHYSICAL EXAM  Temp:  [98.1 F (36.7 C)-99.1 F (37.3 C)] 98.9 F (37.2 C) (04/09 0800) Pulse Rate:  [69-107] 97 (04/09 0900) Resp:  [11-22] 14 (04/09 0900) BP: (106-172)/(59-115) 145/80 (04/09 0900) SpO2:  [97 %-100 %] 100 % (04/09 0900) Weight:  [177 lb (80.3 kg)-179 lb 0.2 oz (81.2 kg)] 179 lb 0.2 oz (81.2 kg) (04/08 1300)  General - Well nourished, well developed, in no apparent distress.  Ophthalmologic - fundi not visualized due to  noncooperation.  Cardiovascular - Regular rate and rhythm with no murmur.  Mental Status -  Level of arousal and orientation to time, place, and person were intact. Language including expression, naming, repetition, comprehension was assessed and found intact.  Cranial Nerves II - XII - II - Visual field intact OU. III, IV, VI - Extraocular movements intact. V - Facial sensation intact bilaterally. VII - left facial droop. VIII - Hearing & vestibular intact bilaterally. X - Palate elevates symmetrically. XI - Chin turning & shoulder shrug intact bilaterally. XII - Tongue protrusion intact.  Motor Strength - The patient's strength was normal in RUE and RLE, but 2/5 LUE and LLEt.  Bulk was normal and fasciculations were absent.   Motor Tone - Muscle tone was assessed at the neck and appendages and was normal.  Reflexes - The patient's reflexes were symmetrical in all extremities and she had no pathological reflexes.  Sensory - Light touch, temperature/pinprick were assessed and were symmetrical.    Coordination - The patient had normal movements in the right hand with no ataxia or dysmetria.  Tremor was absent.  Gait and Station - deferred.   ASSESSMENT/PLAN Ms. KADEDRA VANAKEN is a 52 y.o. female with history of HTN and anemia admitted for found down, left sided weakness, left facial droop and slurry speech. No tPA given  due to ICH.    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 Yes; Fluid consistency: Thin   No antithrombotic prior to admission, now on No antithrombotic due to ICH  Ongoing aggressive stroke risk factor management  Therapy recommendations:  Pending    Disposition:  pending  Cerebral edema  CT showed 4mm midline shift  Repeat CT stable  On 3% saline @ 50->75->40  Na 142->146  Will taper off 3% at this time  Hypertension Stable now BP goal < 140  On cleviprex now, taper off as able  On norvasc 5mg  and lisino 20  Hyperlipidemia  Home meds:  none   LDL 115, goal < 70  Consider statin at discharge  Anemia   Found in 07/2017 with Hb 6.8 s/p transfusion  Iron pill as home meds - resume   Hb 12.2->9.2->8.3  Iron panel pending  Close monitoring - hb in pm pending  PRBC if needed  Tobacco abuse  Current smoker  Smoking cessation counseling provided  Pt is willing to quit  Other Stroke Risk Factors  UDS positive for amphetamine  Other Active Problems  Hypokalemia - supplement - check Mg in am  Hospital day # 1  This patient is critically ill due to right BG ICH, midline shift, anemia, hypertension and at significant risk of neurological worsening, death form hematoma extension, cerebral edema, brain herniation, seizure, hypertensive emergency, anemia, shock. This patient's care requires constant monitoring of vital signs, hemodynamics, respiratory and cardiac monitoring, review of multiple databases, neurological assessment, discussion with family, other specialists and medical decision making of high complexity. I spent 40 minutes of neurocritical care time in the care of this patient.   Marvel Plan, MD PhD Stroke Neurology 02/14/2018 9:47 AM    To contact Stroke Continuity provider, please refer to [email or web address]. After hours, contact General Neurology

## 2018-02-14 NOTE — Progress Notes (Signed)
Modified Barium Swallow Progress Note  Patient Details  Name: Jocelyn [name] MRN: [id number] Date of Birth: 03/20/1966  Today's Date: 02/14/2018  Modified Barium Swallow completed.  Full report located under Chart Review in the Imaging Section.  Brief recommendations include the following:  Clinical Impression  Pt has no significant impairment impacting swallow funciton. There were a few instances of trace premature spillage of bolus, likely due to lingual weakness, but this did not penetrate the vestibule and otherwise swallow and oral manipulation were WNL. Recommend pt initiate a regular diet and thin liquids. no f/u needed for swallowing.    Swallow Evaluation Recommendations       SLP Diet Recommendations: Regular solids;Thin liquid   Liquid Administration via: Cup;Straw   Medication Administration: Whole meds with liquid   Supervision: Patient able to self feed                    Telesia Ates, [name] 02/14/2018,10:40 AM

## 2018-02-14 NOTE — Care Management Note (Signed)
Case Management Note  Patient Details  Name: Jocelyn [name] MRN: [id number] Date of Birth: 1966-05-14  Subjective/Objective:   ICH, HTN, Cerebral edema                Action/Plan: NCM spoke to pt and currently works full-time. Lives at home alone. Goes to [facility] and see Dr. Gabriel CirriBanner. Her sister contacted [employer] to get her paperwork started for her disability. Paperwork will be faxed to attending office. Will provide sister with office number to follow up. Waiting PT/OT evaluation. Will continue to follow for dc needs.   Expected Discharge Date:                Expected Discharge Plan:  Home w Home Health Services  In-House Referral:  NA  Discharge planning Services  CM Consult  Post Acute Care Choice:  Home Health Choice offered to:    Status of Service:  In process, will continue to follow  If discussed at Long Length of Stay Meetings, dates discussed:    Additional Comments:  Elliot CousinShavis, Boruch Manuele Ellen, RN 02/14/2018, 12:10 PM

## 2018-02-14 NOTE — Progress Notes (Signed)
PT Cancellation Note  Patient Details Name: Tobie PoetMickie R Saffran MRN: [id number] DOB: 11-Feb-1966   Cancelled Treatment:    Reason Eval/Treat Not Completed: Active bedrest order   Idrissa Beville B Savi Lastinger 02/14/2018, 7:13 AM  Delaney MeigsMaija Tabor Zlata Alcaide, PT [phone]

## 2018-02-14 NOTE — Progress Notes (Signed)
  Echocardiogram 2D Echocardiogram has been performed.  Dorothye Berni L Androw 02/14/2018, 2:10 PM

## 2018-02-14 NOTE — Progress Notes (Signed)
OT Cancellation Note  Patient Details Name: Jocelyn [name] MRN: [id number] DOB: [birth year]   Cancelled Treatment:    Reason Eval/Treat Not Completed: Active bedrest order  [facility] LLC[facility],HILLARY  Kelson Queenan, OT/L  [phone] 02/14/2018 02/14/2018, 7:14 AM

## 2018-02-14 NOTE — Evaluation (Signed)
Clinical/Bedside Swallow Evaluation Patient Details  Name: Jocelyn [name] MRN: [id number] Date of Birth: 24-Dec-1965  Today's Date: 02/14/2018 Time: SLP Start Time (ACUTE ONLY): 0920 SLP Stop Time (ACUTE ONLY): 0932 SLP Time Calculation (min) (ACUTE ONLY): 12 min  Past Medical History:  Past Medical History:  Diagnosis Date  . Anemia   . Hypertension    Past Surgical History:  Past Surgical History:  Procedure Laterality Date  . MYOMECTOMY     HPI:  52 y.o. female with a history of hypertension, anemia, and tobacco use found in her home by a friend.  She was poorly responsive and had left-sided weakness. She has a previous history of hypertension but states that she was on no medications prior to admission.  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   Assessment / Plan / Recommendation Clinical Impression  Pt demonstrates consistent throat clearing and coughing following sips of thin liquids. Attempted various positions and strategies to potentially improve timing or bolus flow without definititve success. Toelrated purees and solids well and compensates for lingual weakness without oral residual. Will proceed with objective test to determine best diet.  SLP Visit Diagnosis: Dysphagia, oropharyngeal phase (R13.12)    Aspiration Risk  Mild aspiration risk    Diet Recommendation          Other  Recommendations     Follow up Recommendations        Frequency and Duration            Prognosis        Swallow Study   General HPI: 52 y.o. female with a history of hypertension, anemia, and tobacco use found in her home by a friend.  She was poorly responsive and had left-sided weakness. She has a previous history of hypertension but states that she was on no medications prior to admission.   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 Type of Study: Bedside Swallow Evaluation Diet Prior to this Study: NPO Temperature Spikes Noted: No Respiratory Status: Room air History of Recent Intubation: No Behavior/Cognition: Alert;Cooperative Oral Cavity Assessment: Within Functional Limits Oral Care Completed by SLP: No Oral Cavity - Dentition: Adequate natural dentition Vision: Functional for self-feeding Self-Feeding Abilities: Able to feed self Patient Positioning: Upright in bed Baseline Vocal Quality: Normal Volitional Cough: Weak Volitional Swallow: Able to elicit    Oral/Motor/Sensory Function Overall Oral Motor/Sensory Function: Mild impairment Facial ROM: Reduced left;Suspected CN VII (facial) dysfunction Facial Symmetry: Abnormal symmetry left;Suspected CN VII (facial) dysfunction Facial Strength: Reduced left;Suspected CN VII (facial) dysfunction Facial Sensation: Within Functional Limits Lingual ROM: Reduced left;Suspected CN XII (hypoglossal) dysfunction Lingual Symmetry: Abnormal symmetry left;Suspected CN XII (hypoglossal) dysfunction Lingual Strength: Reduced;Suspected CN XII (hypoglossal) dysfunction Lingual Sensation: Within Functional Limits Velum: Within Functional Limits Mandible: Within Functional Limits   Ice Chips Ice chips: Within functional limits   Thin Liquid Thin Liquid: Impaired Presentation: Cup;Straw;Self Fed Pharyngeal  Phase Impairments: Cough - Immediate;Throat Clearing - Immediate    Nectar Thick Nectar Thick Liquid: Not tested   Honey Thick Honey Thick Liquid: Not tested   Puree Puree: Within functional limits Presentation: Self Fed;Spoon   Solid   GO  Solid: Within functional limits        Elina Streng, Riley Nearing 02/14/2018,10:28 AM

## 2018-02-15 DIAGNOSIS — D638 Anemia in other chronic diseases classified elsewhere: Secondary | ICD-10-CM

## 2018-02-15 DIAGNOSIS — I619 Nontraumatic intracerebral hemorrhage, unspecified: Secondary | ICD-10-CM

## 2018-02-15 DIAGNOSIS — I610 Nontraumatic intracerebral hemorrhage in hemisphere, subcortical: Principal | ICD-10-CM

## 2018-02-15 DIAGNOSIS — F1510 Other stimulant abuse, uncomplicated: Secondary | ICD-10-CM

## 2018-02-15 DIAGNOSIS — E876 Hypokalemia: Secondary | ICD-10-CM

## 2018-02-15 DIAGNOSIS — I10 Essential (primary) hypertension: Secondary | ICD-10-CM | POA: Diagnosis present

## 2018-02-15 DIAGNOSIS — Z720 Tobacco use: Secondary | ICD-10-CM

## 2018-02-15 LAB — BASIC METABOLIC PANEL
Anion gap: 10 (ref 5–15)
BUN: 7 mg/dL (ref 6–20)
CHLORIDE: 108 mmol/L (ref 101–111)
CO2: 24 mmol/L (ref 22–32)
CREATININE: 0.75 mg/dL (ref 0.44–1.00)
Calcium: 8.7 mg/dL — ABNORMAL LOW (ref 8.9–10.3)
GFR calc Af Amer: >60 mL/min (ref >60)
GFR calc non Af Amer: >60 mL/min (ref >60)
Glucose, Bld: 85 mg/dL (ref 65–99)
Potassium: 3.3 mmol/L — ABNORMAL LOW (ref 3.5–5.1)
SODIUM: 142 mmol/L (ref 135–145)

## 2018-02-15 LAB — MAGNESIUM: MAGNESIUM: 2 mg/dL (ref 1.7–2.4)

## 2018-02-15 LAB — CBC
HCT: 30.4 % — ABNORMAL LOW (ref 36.0–46.0)
HEMOGLOBIN: 8.3 g/dL — AB (ref 12.0–15.0)
MCH: 17.1 pg — ABNORMAL LOW (ref 26.0–34.0)
MCHC: 27.3 g/dL — AB (ref 30.0–36.0)
MCV: 62.6 fL — ABNORMAL LOW (ref 78.0–100.0)
Platelets: 445 10*3/uL — ABNORMAL HIGH (ref 150–400)
RBC: 4.86 MIL/uL (ref 3.87–5.11)
RDW: 21.9 % — AB (ref 11.5–15.5)
WBC: 7.2 10*3/uL (ref 4.0–10.5)

## 2018-02-15 NOTE — Evaluation (Signed)
Clinical/Bedside Swallow Evaluation Patient Details  Name: Jocelyn [name] MRN: [id number] Date of Birth: [birth year]  Today's Date: 02/15/2018 Time: SLP Start Time (ACUTE ONLY): 1430 SLP Stop Time (ACUTE ONLY): 1451 SLP Time Calculation (min) (ACUTE ONLY): 21 min  Past Medical History:  Past Medical History:  Diagnosis Date  . Anemia   . Hypertension    Past Surgical History:  Past Surgical History:  Procedure Laterality Date  . MYOMECTOMY     HPI:  52 y.o. female with a history of hypertension, anemia, and tobacco use found in her home by a friend.  She was poorly responsive and had left-sided weakness. She has a previous history of hypertension but states that she was on no medications prior to admission.  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   Assessment / Plan / Recommendation Clinical Impression  Pt demosntrates in tact cognitive function though extra time is needed for higher level tasks. Pt is also mildly dysarthric. Provided basic compensatory strategies for clear speech. No acute f/u needed though pt may benefit from f/u at next level of care.  SLP Visit Diagnosis: Dysarthria and anarthria (R47.1)    Aspiration Risk       Diet Recommendation          Other  Recommendations     Follow up Recommendations Inpatient Rehab      Frequency and Duration            Prognosis        Swallow Study   General HPI: 52 y.o. female with a history of hypertension, anemia, and tobacco use found in her home by a friend.  She was poorly responsive and had left-sided weakness. She has a previous history of hypertension but states that she was on no medications prior to admission.  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    Oral/Motor/Sensory Function Overall Oral Motor/Sensory Function: Mild impairment Facial ROM: Reduced left;Suspected CN VII (facial) dysfunction Facial Symmetry: Abnormal symmetry left;Suspected CN VII (facial) dysfunction Facial Strength: Reduced left;Suspected CN VII (facial) dysfunction Facial Sensation: Within Functional Limits Lingual ROM: Reduced left;Suspected CN XII (hypoglossal) dysfunction Lingual Symmetry: Abnormal symmetry left;Suspected CN XII (hypoglossal) dysfunction Lingual Strength: Reduced;Suspected CN XII (hypoglossal) dysfunction Lingual Sensation: Within Functional Limits Velum: Within Functional Limits Mandible: Within Functional Limits   Ice Chips     Thin Liquid      Nectar Thick     Honey Thick     Puree     Solid   GO           Jocelyn DittyBonnie Inmer Nix, MA CCC-SLP [phone]  Jocelyn [name], Jocelyn NearingBonnie [name] 02/15/2018,2:55 PM

## 2018-02-15 NOTE — Progress Notes (Signed)
Inpatient Rehabilitation  Per PT request, patient was screened by Ambers Iyengar for appropriateness for an Inpatient Acute Rehab consult.  At this time we are recommending an Inpatient Rehab consult.  Please order if you are agreeable.    Delois Tolbert, M.A., CCC/SLP Admission Coordinator  [facility]  Cell [phone]  

## 2018-02-15 NOTE — Evaluation (Signed)
Physical Therapy Evaluation Patient Details Name: Jocelyn [name] MRN: [id number] DOB: [birth year] Today's Date: 02/15/2018   History of Present Illness  [name] is a 52 y.o. female with history of HTN and anemia admitted for found down, left sided weakness, left facial droop and slurry speech. 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  Clinical Impression  Patient presents with decreased independence and safety with mobility due to L side weakness, decreased general strength, decreased balance, decreased activity tolerance, and will benefit from skilled PT in the acute setting to allow return home with family support following CIR level rehab stay.  Currently needs mod A+2 for short distance ambulation in the room and was previously independent working as a Museum/gallery curator.      Follow Up Recommendations CIR    Equipment Recommendations  Other (comment)(TBA)    Recommendations for Other Services Rehab consult     Precautions / Restrictions Precautions Precautions: Fall Precaution Comments: watch BP (DBP elevation) Restrictions Weight Bearing Restrictions: No      Mobility  Bed Mobility Overal bed mobility: Needs Assistance Bed Mobility: Supine to Sit     Supine to sit: Mod assist;HOB elevated     General bed mobility comments: assist for L leg and trunk upright with cues for technique and hand placement  Transfers Overall transfer level: Needs assistance Equipment used: 1 person hand held assist;2 person hand held assist Transfers: Sit to/from Stand Sit to Stand: Max assist;Mod assist;+2 physical assistance         General transfer comment: initially with +1 A needed lifting help and block for L leg, mod cues and facilitation for anterior weight shift;   Ambulation/Gait Ambulation/Gait assistance: Mod assist;+2 physical assistance;Max assist Ambulation Distance (Feet): 3 Feet Assistive device: 2 person hand held assist Gait  Pattern/deviations: Step-to pattern;Decreased stride length;Decreased dorsiflexion - left;Decreased stance time - left     General Gait Details: assist to block L knee, initially assist to progress L LE, then able to move it once standing on it some, mod cues for posture, assist for weight shift and leaning to L.   Stairs            Wheelchair Mobility    Modified Rankin (Stroke Patients Only) Modified Rankin (Stroke Patients Only) Pre-Morbid Rankin Score: No symptoms Modified Rankin: Moderately severe disability     Balance Overall balance assessment: Needs assistance Sitting-balance support: Feet unsupported;Bilateral upper extremity supported Sitting balance-Leahy Scale: Poor Sitting balance - Comments: initially at EOB leaning posteriorly, once cues for anterior weight shift able to remain with UE support and turns head without LOB Postural control: Posterior lean Standing balance support: Bilateral upper extremity supported Standing balance-Leahy Scale: Zero Standing balance comment: mod to max A of 2 for standing balance                              Pertinent Vitals/Pain Pain Assessment: No/denies pain    Home Living Family/patient expects to be discharged to:: Private residence Living Arrangements: Alone Available Help at Discharge: Family;Available 24 hours/day(separated from spouse, but he will help) Type of Home: Apartment Home Access: Level entry     Home Layout: One level Home Equipment: None 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, Furniture conservator/restorer    Prior Function Level of Independence: Independent               Higher education careers adviser  Extremity/Trunk Assessment   Upper Extremity Assessment Upper Extremity Assessment: Defer to OT evaluation    Lower Extremity Assessment Lower Extremity Assessment: LLE deficits/detail LLE Deficits / Details: no active movement seen in ankle, positive knee extension about 2+/5, hip flexion about  2-/5, reports intact sensation, but question proprioception    Cervical / Trunk Assessment Cervical / Trunk Assessment: Other exceptions Cervical / Trunk Exceptions: trunk elongated on L with R ward head lean  Communication   Communication: Expressive difficulties(dysarthria)  Cognition Arousal/Alertness: Awake/alert Behavior During Therapy: WFL for tasks assessed/performed Overall Cognitive Status: Within Functional Limits for tasks assessed                                        General Comments General comments (skin integrity, edema, etc.): Patient with heavy braids trailing down from top of her head to the left with lateral head lean    Exercises     Assessment/Plan    PT Assessment Patient needs continued PT services  PT Problem List Decreased strength;Decreased mobility;Decreased activity tolerance;Decreased coordination;Decreased balance;Decreased knowledge of use of DME       PT Treatment Interventions DME instruction;Therapeutic activities;Patient/family education;Therapeutic exercise;Gait training;Balance training;Functional mobility training;Neuromuscular re-education    PT Goals (Current goals can be found in the Care Plan section)  Acute Rehab PT Goals Patient Stated Goal: To return to independent PT Goal Formulation: With patient Time For Goal Achievement: 03/01/18 Potential to Achieve Goals: Good    Frequency Min 4X/week   Barriers to discharge        Co-evaluation               AM-PAC PT "6 Clicks" Daily Activity  Outcome Measure Difficulty turning over in bed (including adjusting bedclothes, sheets and blankets)?: Unable Difficulty moving from lying on back to sitting on the side of the bed? : Unable Difficulty sitting down on and standing up from a chair with arms (e.g., wheelchair, bedside commode, etc,.)?: Unable Help needed moving to and from a bed to chair (including a wheelchair)?: A Lot Help needed walking in hospital  room?: Total Help needed climbing 3-5 steps with a railing? : Total 6 Click Score: 7    End of Session Equipment Utilized During Treatment: Gait belt Activity Tolerance: Patient limited by fatigue Patient left: with call bell/phone within reach;in chair Nurse Communication: Mobility status PT Visit Diagnosis: Other abnormalities of gait and mobility (R26.89);Hemiplegia and hemiparesis Hemiplegia - Right/Left: Left Hemiplegia - dominant/non-dominant: Non-dominant Hemiplegia - caused by: Nontraumatic intracerebral hemorrhage    Time: [id number] PT Time Calculation (min) (ACUTE ONLY): 28 min   Charges:   PT Evaluation $PT Eval Moderate Complexity: 1 Mod PT Treatments $Therapeutic Activity: 8-22 mins   PT G CodesSheran [name]:        Jocelyn [name], [place]PT [phone] 02/15/2018   Jocelyn [name] 02/15/2018, 11:46 AM

## 2018-02-15 NOTE — Progress Notes (Addendum)
STROKE TEAM PROGRESS NOTE   SUBJECTIVE (INTERVAL HISTORY) No family at the bedside.  Overall she feels her condition is unchanged from yesterday. BP stable off cleviprex. Passed swallow. Off 3% saline. No complaints. Ready for therapy.   OBJECTIVE Temp:  [98.5 F (36.9 C)-99.2 F (37.3 C)] 98.5 F (36.9 C) (04/10 0400) Pulse Rate:  [63-92] 82 (04/10 0900) Cardiac Rhythm: Normal sinus rhythm (04/10 0800) Resp:  [6-20] 14 (04/10 0900) BP: (119-140)/(73-95) 124/81 (04/10 0900) SpO2:  [99 %-100 %] 100 % (04/10 0900)  Recent Labs  Lab 02/13/18 1040  GLUCAP 106*   Recent Labs  Lab 02/13/18 1045 02/13/18 1123  02/14/18 0613 02/14/18 1314 02/14/18 1711 02/14/18 2239 02/15/18 0341  NA 140 139   < > 146* 143 143 142 142  K 3.8 3.9  --  3.3*  --   --   --  3.3*  CL 103 103  --  114*  --   --   --  108  CO2  --  17*  --  23  --   --   --  24  GLUCOSE 111* 96  --  100*  --   --   --  85  BUN 14 12  --  6  --   --   --  7  CREATININE 0.60 0.69  --  0.80  --   --   --  0.75  CALCIUM  --  9.3  --  8.5*  --   --   --  8.7*  MG  --   --   --   --   --   --   --  2.0   < > = values in this interval not displayed.   Recent Labs  Lab 02/13/18 1123  AST 35  ALT 16  ALKPHOS 82  BILITOT 0.6  PROT 7.8  ALBUMIN 3.8   Recent Labs  Lab 02/13/18 1045 02/13/18 1123 02/14/18 0613 02/14/18 1711 02/15/18 0341  WBC  --  11.9* 8.5  --  7.2  NEUTROABS  --  10.2*  --   --   --   HGB 12.2 9.2* 8.3* 8.4* 8.3*  HCT 36.0 33.4* 30.2*  --  30.4*  MCV  --  62.2* 61.8*  --  62.6*  PLT  --  462* 490*  --  445*   Recent Labs  Lab 02/13/18 1324  CKTOTAL 1,541*  CKMB 14.3*   Recent Labs    02/13/18 1123  LABPROT 13.7  INR 1.06   Recent Labs    02/13/18 1235  COLORURINE YELLOW  LABSPEC 1.030  PHURINE 5.0  GLUCOSEU NEGATIVE  HGBUR LARGE*  BILIRUBINUR NEGATIVE  KETONESUR 5*  PROTEINUR >=300*  NITRITE NEGATIVE  LEUKOCYTESUR TRACE*       Component Value Date/Time   CHOL 175  02/13/2018 1123   TRIG 26 02/13/2018 1123   HDL 55 02/13/2018 1123   CHOLHDL 3.2 02/13/2018 1123   VLDL 5 02/13/2018 1123   LDLCALC 115 (H) 02/13/2018 1123   Lab Results  Component Value Date   HGBA1C 5.3 02/13/2018      Component Value Date/Time   LABOPIA NONE DETECTED 02/13/2018 1235   COCAINSCRNUR NONE DETECTED 02/13/2018 1235   LABBENZ NONE DETECTED 02/13/2018 1235   AMPHETMU POSITIVE (A) 02/13/2018 1235   THCU NONE DETECTED 02/13/2018 1235   LABBARB NONE DETECTED 02/13/2018 1235    Recent Labs  Lab 02/13/18 1123  ETH <10     Ct  Angio Head W Or Wo Contrast  Result Date: 02/14/2018 CLINICAL DATA:  52 y/o F; stroke for follow-up, left-sided weakness. EXAM: CT ANGIOGRAPHY HEAD AND NECK CT HEAD WITHOUT CONTRAST TECHNIQUE: Multidetector CT imaging of the head and neck was performed using the standard protocol during bolus administration of intravenous contrast. Multiplanar CT image reconstructions and MIPs were obtained to evaluate the vascular anatomy. Carotid stenosis measurements (when applicable) are obtained utilizing NASCET criteria, using the distal internal carotid diameter as the denominator. CONTRAST:  50mL ISOVUE-370 IOPAMIDOL (ISOVUE-370) INJECTION 76% COMPARISON:  02/13/2018 CT head. FINDINGS: CT HEAD FINDINGS Brain: Stable acute hemorrhage within the right basal ganglia measuring 3.7 x 2.0 cm (series 14, image 13). Stable surrounding edema and associated mass effect with partial effacement of right lateral ventricle and 4 mm of right-to-left midline shift. No new acute intracranial hemorrhage, large acute stroke, or focal mass effect of the brain. No extra-axial collection, effacement of basilar cisterns, or herniation. Vascular: As below. Skull: Normal. Negative for fracture or focal lesion. Sinuses: Imaged portions are clear. Orbits: No acute finding. Review of the MIP images confirms the above findings CTA NECK FINDINGS Aortic arch: Standard branching. Imaged portion shows  no evidence of aneurysm or dissection. No significant stenosis of the major arch vessel origins. Right carotid system: No evidence of dissection, stenosis (50% or greater) or occlusion. Left carotid system: No evidence of dissection, stenosis (50% or greater) or occlusion. Vertebral arteries: Left dominant. No evidence of dissection, stenosis (50% or greater) or occlusion. Skeleton: Mild cervical spondylosis. No high-grade bony canal stenosis. Other neck: Negative. Upper chest: Negative. Review of the MIP images confirms the above findings CTA HEAD FINDINGS Anterior circulation: No significant stenosis, proximal occlusion, aneurysm, or vascular malformation. Posterior circulation: No significant stenosis, proximal occlusion, aneurysm, or vascular malformation. Venous sinuses: As permitted by contrast timing, patent. Anatomic variants: Right fetal PCA. Right vertebral artery ends in the right PICA. Delayed phase: No abnormal intracranial enhancement. Review of the MIP images confirms the above findings IMPRESSION: 1. Patent carotid and vertebral arteries. No dissection, aneurysm, or hemodynamically significant stenosis utilizing NASCET criteria. 2. Patent anterior and posterior intracranial circulation. No large vessel occlusion, aneurysm, or significant stenosis. No vascular malformation. 3. Stable hematoma in right basal ganglia measuring up to 3.7 cm. Stable associated edema and mass effect with 4 mm right-to-left midline shift. No new acute intracranial abnormality identified. Electronically Signed   By: Mitzi HansenLance  Furusawa-Stratton M.D.   On: 02/14/2018 01:13   Ct Head Wo Contrast  Result Date: 02/14/2018 CLINICAL DATA:  52 y/o F; stroke for follow-up, left-sided weakness. EXAM: CT ANGIOGRAPHY HEAD AND NECK CT HEAD WITHOUT CONTRAST TECHNIQUE: Multidetector CT imaging of the head and neck was performed using the standard protocol during bolus administration of intravenous contrast. Multiplanar CT image  reconstructions and MIPs were obtained to evaluate the vascular anatomy. Carotid stenosis measurements (when applicable) are obtained utilizing NASCET criteria, using the distal internal carotid diameter as the denominator. CONTRAST:  50mL ISOVUE-370 IOPAMIDOL (ISOVUE-370) INJECTION 76% COMPARISON:  02/13/2018 CT head. FINDINGS: CT HEAD FINDINGS Brain: Stable acute hemorrhage within the right basal ganglia measuring 3.7 x 2.0 cm (series 14, image 13). Stable surrounding edema and associated mass effect with partial effacement of right lateral ventricle and 4 mm of right-to-left midline shift. No new acute intracranial hemorrhage, large acute stroke, or focal mass effect of the brain. No extra-axial collection, effacement of basilar cisterns, or herniation. Vascular: As below. Skull: Normal. Negative for fracture or focal lesion. Sinuses:  Imaged portions are clear. Orbits: No acute finding. Review of the MIP images confirms the above findings CTA NECK FINDINGS Aortic arch: Standard branching. Imaged portion shows no evidence of aneurysm or dissection. No significant stenosis of the major arch vessel origins. Right carotid system: No evidence of dissection, stenosis (50% or greater) or occlusion. Left carotid system: No evidence of dissection, stenosis (50% or greater) or occlusion. Vertebral arteries: Left dominant. No evidence of dissection, stenosis (50% or greater) or occlusion. Skeleton: Mild cervical spondylosis. No high-grade bony canal stenosis. Other neck: Negative. Upper chest: Negative. Review of the MIP images confirms the above findings CTA HEAD FINDINGS Anterior circulation: No significant stenosis, proximal occlusion, aneurysm, or vascular malformation. Posterior circulation: No significant stenosis, proximal occlusion, aneurysm, or vascular malformation. Venous sinuses: As permitted by contrast timing, patent. Anatomic variants: Right fetal PCA. Right vertebral artery ends in the right PICA. Delayed  phase: No abnormal intracranial enhancement. Review of the MIP images confirms the above findings IMPRESSION: 1. Patent carotid and vertebral arteries. No dissection, aneurysm, or hemodynamically significant stenosis utilizing NASCET criteria. 2. Patent anterior and posterior intracranial circulation. No large vessel occlusion, aneurysm, or significant stenosis. No vascular malformation. 3. Stable hematoma in right basal ganglia measuring up to 3.7 cm. Stable associated edema and mass effect with 4 mm right-to-left midline shift. No new acute intracranial abnormality identified. Electronically Signed   By: Mitzi Hansen M.D.   On: 02/14/2018 01:13   Ct Angio Neck W Or Wo Contrast  Result Date: 02/14/2018 CLINICAL DATA:  52 y/o F; stroke for follow-up, left-sided weakness. EXAM: CT ANGIOGRAPHY HEAD AND NECK CT HEAD WITHOUT CONTRAST TECHNIQUE: Multidetector CT imaging of the head and neck was performed using the standard protocol during bolus administration of intravenous contrast. Multiplanar CT image reconstructions and MIPs were obtained to evaluate the vascular anatomy. Carotid stenosis measurements (when applicable) are obtained utilizing NASCET criteria, using the distal internal carotid diameter as the denominator. CONTRAST:  50mL ISOVUE-370 IOPAMIDOL (ISOVUE-370) INJECTION 76% COMPARISON:  02/13/2018 CT head. FINDINGS: CT HEAD FINDINGS Brain: Stable acute hemorrhage within the right basal ganglia measuring 3.7 x 2.0 cm (series 14, image 13). Stable surrounding edema and associated mass effect with partial effacement of right lateral ventricle and 4 mm of right-to-left midline shift. No new acute intracranial hemorrhage, large acute stroke, or focal mass effect of the brain. No extra-axial collection, effacement of basilar cisterns, or herniation. Vascular: As below. Skull: Normal. Negative for fracture or focal lesion. Sinuses: Imaged portions are clear. Orbits: No acute finding. Review of the  MIP images confirms the above findings CTA NECK FINDINGS Aortic arch: Standard branching. Imaged portion shows no evidence of aneurysm or dissection. No significant stenosis of the major arch vessel origins. Right carotid system: No evidence of dissection, stenosis (50% or greater) or occlusion. Left carotid system: No evidence of dissection, stenosis (50% or greater) or occlusion. Vertebral arteries: Left dominant. No evidence of dissection, stenosis (50% or greater) or occlusion. Skeleton: Mild cervical spondylosis. No high-grade bony canal stenosis. Other neck: Negative. Upper chest: Negative. Review of the MIP images confirms the above findings CTA HEAD FINDINGS Anterior circulation: No significant stenosis, proximal occlusion, aneurysm, or vascular malformation. Posterior circulation: No significant stenosis, proximal occlusion, aneurysm, or vascular malformation. Venous sinuses: As permitted by contrast timing, patent. Anatomic variants: Right fetal PCA. Right vertebral artery ends in the right PICA. Delayed phase: No abnormal intracranial enhancement. Review of the MIP images confirms the above findings IMPRESSION: 1. Patent carotid and vertebral  arteries. No dissection, aneurysm, or hemodynamically significant stenosis utilizing NASCET criteria. 2. Patent anterior and posterior intracranial circulation. No large vessel occlusion, aneurysm, or significant stenosis. No vascular malformation. 3. Stable hematoma in right basal ganglia measuring up to 3.7 cm. Stable associated edema and mass effect with 4 mm right-to-left midline shift. No new acute intracranial abnormality identified. Electronically Signed   By: Mitzi Hansen M.D.   On: 02/14/2018 01:13   Dg Chest Port 1 View  Result Date: 02/13/2018 CLINICAL DATA:  Intracranial hemorrhage.  Hypertension. EXAM: PORTABLE CHEST 1 VIEW COMPARISON:  November 05, 2015 FINDINGS: Lungs are clear. Heart size and pulmonary vascularity are normal. No  adenopathy. No bone lesions. IMPRESSION: No edema or consolidation. Electronically Signed   By: Bretta Bang III M.D.   On: 02/13/2018 11:32   Ct Head Code Stroke Wo Contrast  Result Date: 02/13/2018 CLINICAL DATA:  Code stroke.  Left-sided weakness. EXAM: CT HEAD WITHOUT CONTRAST TECHNIQUE: Contiguous axial images were obtained from the base of the skull through the vertex without intravenous contrast. COMPARISON:  None. FINDINGS: Brain: An acute parenchymal hemorrhage centered in the lateral aspect of the right lentiform nucleus/external capsule measures 3.9 x 2.0 x 2.7 cm (approximate volume of 11 cc). There is mild surrounding edema with mass effect on the basal ganglia and right lateral ventricle. Leftward midline shift measures 4 mm. There is no ventriculomegaly. No acute large territory infarct is identified separate from the hemorrhage. Cerebral volume is within normal limits for age. There is no extra-axial fluid collection. Vascular: No hyperdense vessel. Skull: No fracture focal osseous lesion. Sinuses/Orbits: Visualized paranasal sinuses and mastoid air cells are clear. Orbits are unremarkable. Other: None. ASPECTS [facility] Stroke Program Early CT Score) Not scored due to the presence of hemorrhage. 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 Critical Value/emergent results were called by telephone at the time of interpretation on 02/13/2018 at 11:01 am to Dr. Caryl Pina, who verbally acknowledged these results. Electronically Signed   By: Sebastian Ache M.D.   On: 02/13/2018 11:01    2D Echocardiogram  - Left ventricle: The cavity size was normal. Wall thickness was normal. Systolic function was normal. The estimated ejection fraction was in the range of 60% to 65%. Wall motion was normal; there were no regional wall motion abnormalities. Left ventricular diastolic function parameters were normal. - Aortic valve: Sclerosis without  stenosis. Transvalvular velocity was minimally increased. There was no regurgitation. Mean gradient (S): 9 mm Hg. Peak gradient (S): 16 mm Hg. - Left atrium: The atrium was normal in size. - Right atrium: The atrium was mildly dilated. - Inferior vena cava: The vessel was normal in size. The respirophasic diameter changes were in the normal range (>= 50%), consistent with normal central venous pressure. Impressions:  LVEF 60-65%, normal wall thickness, normal wall motion, normal diastolic function, aortic valve sclerosis, normal LA size, mild RAE, normal IVC.   PHYSICAL EXAM General - Well nourished, well developed, in no apparent distress. Cardiovascular - Regular rate and rhythm with no murmur.  Mental Status -  Level of arousal and orientation to time, place, and person were intact. Language including expression, naming, repetition, comprehension was assessed and found intact.  Cranial Nerves II - XII - II - Visual field intact OU. III, IV, VI - Extraocular movements intact. V - Facial sensation intact bilaterally. VII - left facial droop. VIII - Hearing & vestibular intact bilaterally. X - Palate elevates  symmetrically. XI - Chin turning & shoulder shrug intact bilaterally. XII - Tongue protrusion intact.  Motor Strength - The patient's strength was normal in RUE and RLE, but 2/5 LUE and LLE.  Bulk was normal and fasciculations were absent.   Motor Tone - Muscle tone was assessed at the neck and appendages and was normal.  Sensory - Light touch, temperature/pinprick were assessed and were symmetrical.    Coordination - The patient had normal movements in the right hand with no ataxia or dysmetria.  Tremor was absent.  Plantars:  R downgoing, L upgoing  Gait and Station - deferred.  No significant change in exam from yesterday   ASSESSMENT/PLAN [name] is a 52 y.o. female with history of HTN and anemia admitted for found down, left sided weakness, left facial  droop and slurry speech. No tPA given due to ICH.    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 No source of embolus   LDL 115  HgbA1c 5.3  SCDs for VTE prophylaxis Fall precautions  Diet Heart Room service appropriate? Yes; Fluid consistency: Thin   No antithrombotic prior to admission, now on No antithrombotic due to ICH  Ongoing aggressive stroke risk factor management  Therapy recommendations:  Pending. Ok to be OOB  Disposition:  Pending  Transfer to the floor  Cerebral edema  CT showed 4mm midline shift  Repeat CT stable  Treated with 3% saline, now off  Na 142  Hypertensive Emergency BP as high as 172/97 Treated with cleviprex, now off  BP goal < 140 At goal > 24h  On norvasc 5mg  and lisino 20  Hyperlipidemia  Home meds:  none   LDL 115, goal < 70  Consider statin at discharge  Anemia   Found in 07/2017 with Hb 6.8 s/p transfusion  Iron pill as home meds  Hb 12.2->9.2->8.3->8.4 (stable)  Iron panel Fe 9 L, TIBC 350, Sat 3 L, Fer 5 L   PRBC if needed  Continue iron tabls  Tobacco abuse  Current smoker  Smoking cessation counseling provided  Pt is willing to quit  Other Stroke Risk Factors  UDS positive for amphetamine  Other Active Problems  Hypokalemia - supplement - Mg normal  Hospital day # 2  Annie Main, MSN, APRN, ANVP-BC, AGPCNP-BC Advanced Practice Stroke Nurse [facility] See Amion for Schedule & Pager information 02/15/2018 10:01 AM   ATTENDING NOTE: I reviewed above note and agree with the assessment and plan. I have made any additions or clarifications directly to the above note. Pt was seen and examined.   Pt was working with PT during round. LLE improved strength and LUE no  significant change. No HA. Off 3% saline. Transfer to floor. PT/OT recommend CIR. BP stable off cleviprex, on PO meds. BP goal < 160. Pt willing to quit smoking. Will repeat CT head in am to re-evaluate midline shift.   Marvel Plan, MD PhD Stroke Neurology 02/15/2018 8:10 PM     To contact Stroke Continuity provider, please refer to [email or web address]. After hours, contact General Neurology

## 2018-02-15 NOTE — Evaluation (Signed)
Occupational Therapy Evaluation Patient Details Name: Jocelyn [name] MRN: [id number] DOB: [birth year] Today's Date: 02/15/2018    History of Present Illness [name] is a 52 y.o. female with history of HTN and anemia admitted for found down, left sided weakness, left facial droop and slurry speech. 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   Clinical Impression   PTA, pt was living alone and was independent and working. Pt currently requiring Mod A for grooming, Max A for UB ADLs, Max A +2 for LB ADLs, and Mod A +2 for short distance functional mobility. Pt presenting with no active movement of LUE (no withdrawal to pain), poor balance, and decreased activity tolerance. Pt motivated to return to PLOF. Pt will require further acute OT to facilitate safe dc. Recommend dc to CIR for intensive OT to optimize safety, independence with ADLs, and return to PLOF.      Follow Up Recommendations  CIR;Supervision/Assistance - 24 hour    Equipment Recommendations  Other (comment)(Defer to next venue)    Recommendations for Other Services PT consult;Rehab consult;Speech consult     Precautions / Restrictions Precautions Precautions: Fall Precaution Comments: watch BP (DBP elevation) Restrictions Weight Bearing Restrictions: No      Mobility Bed Mobility Overal bed mobility: Needs Assistance Bed Mobility: Supine to Sit     Supine to sit: Mod assist;HOB elevated     General bed mobility comments: Pt at EOB with PT  Transfers Overall transfer level: Needs assistance Equipment used: 2 person hand held assist Transfers: Sit to/from Stand Sit to Stand: Mod assist;+2 physical assistance         General transfer comment: Pt requiring Mod A +2 for power up into standing. Blocking of left knee and use of bedpad to elevate hips    Balance Overall balance assessment: Needs assistance Sitting-balance support: Feet unsupported;Bilateral upper extremity  supported Sitting balance-Leahy Scale: Poor Sitting balance - Comments: initially at EOB leaning posteriorly, once cues for anterior weight shift able to remain with UE support and turns head without LOB Postural control: Posterior lean Standing balance support: Bilateral upper extremity supported Standing balance-Leahy Scale: Zero Standing balance comment: mod to max A of 2 for standing balance                            ADL either performed or assessed with clinical judgement   ADL Overall ADL's : Needs assistance/impaired Eating/Feeding: Moderate assistance;Sitting   Grooming: Moderate assistance;Oral care;Sitting Grooming Details (indicate cue type and reason): Sitting in recliner, Pt requiring Mod A for bilateral tasks. Requiring hand over hand to hold tooth paste in left hand while right hand twist cap. Requiring Min VCs for problem solving Upper Body Bathing: Maximal assistance;Sitting   Lower Body Bathing: Maximal assistance;+2 for physical assistance;Sit to/from stand   Upper Body Dressing : Maximal assistance;Sitting   Lower Body Dressing: Maximal assistance;Sit to/from stand   Toilet Transfer: Maximal assistance;+2 for physical assistance;Ambulation(Simulated to recliner)           Functional mobility during ADLs: Maximal assistance;+2 for physical assistance;Cueing for sequencing General ADL Comments: Pt with flat affect, decreased verabl responces to questions, and decreased active movement of LUE     Vision Baseline Vision/History: No visual deficits Patient Visual Report: No change from baseline Vision Assessment?: Yes Eye Alignment: Within Functional Limits Ocular Range of Motion: Within Functional Limits Alignment/Gaze Preference: Within Defined Limits Tracking/Visual Pursuits: Requires cues, head  turns, or add eye shifts to track;Unable to hold eye position out of midline Convergence: Impaired - to be further tested in functional  context Additional Comments: Pt requiring cues for tracking and unable to maintain head position without cues.     Perception     Praxis      Pertinent Vitals/Pain Pain Assessment: No/denies pain     Hand Dominance Right   Extremity/Trunk Assessment Upper Extremity Assessment Upper Extremity Assessment: LUE deficits/detail LUE Deficits / Details: Pt with slight grasp and no active movement at wrist, elbow, and shoulder. Brunnstrom stage 1. No withdrawl to pain.  LUE Coordination: decreased fine motor;decreased gross motor   Lower Extremity Assessment Lower Extremity Assessment: Defer to PT evaluation LLE Deficits / Details: no active movement seen in ankle, positive knee extension about 2+/5, hip flexion about 2-/5, reports intact sensation, but question proprioception   Cervical / Trunk Assessment Cervical / Trunk Assessment: Other exceptions Cervical / Trunk Exceptions: trunk elongated on L with R ward head lean   Communication Communication Communication: Expressive difficulties(dysarthria)   Cognition Arousal/Alertness: Awake/alert Behavior During Therapy: Flat affect Overall Cognitive Status: No family/caregiver present to determine baseline cognitive functioning                                 General Comments: Following simple commands. Requiring increased time for problem solving. Decreased verbal responces to questions; mainly using head nods.    General Comments  Patient with heavy braids trailing down from top of her head to the left with lateral head lean    Exercises     Shoulder Instructions      Home Living Family/patient expects to be discharged to:: Private residence Living Arrangements: Alone Available Help at Discharge: Family;Available 24 hours/day(separated from spouse, but he will help) Type of Home: Apartment Home Access: Level entry     Home Layout: One level     Bathroom Shower/Tub: Chief Strategy Officer:  Standard     Home Equipment: None   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, Furniture conservator/restorer      Prior Functioning/Environment Level of Independence: Independent        Comments: ADLs, IADLs, driving, and working a Health and safety inspector job        OT Problem List: Decreased strength;Decreased range of motion;Decreased activity tolerance;Impaired balance (sitting and/or standing);Decreased coordination;Decreased cognition;Decreased safety awareness;Impaired UE functional use      OT Treatment/Interventions: Self-care/ADL training;Therapeutic exercise;Energy conservation;DME and/or AE instruction;Therapeutic activities;Patient/family education    OT Goals(Current goals can be found in the care plan section) Acute Rehab OT Goals Patient Stated Goal: To return to independent OT Goal Formulation: With patient Time For Goal Achievement: 03/01/18 Potential to Achieve Goals: Good ADL Goals Pt Will Perform Eating: (P) with set-up;with supervision;sitting Pt Will Perform Grooming: (P) with set-up;with supervision;sitting Pt Will Perform Upper Body Dressing: (P) with set-up;with supervision;sitting Pt Will Perform Lower Body Dressing: (P) with min assist;sit to/from stand Pt Will Transfer to Toilet: (P) with min assist;bedside commode;stand pivot transfer Additional ADL Goal #1: (P) Pt will use LUE as dependent stabilizer during ADLs  OT Frequency: Min 2X/week   Barriers to D/C:            Co-evaluation              AM-PAC PT "6 Clicks" Daily Activity     Outcome Measure Help from another person eating meals?: A Lot Help from another  person taking care of personal grooming?: A Lot Help from another person toileting, which includes using toliet, bedpan, or urinal?: A Lot Help from another person bathing (including washing, rinsing, drying)?: A Lot Help from another person to put on and taking off regular upper body clothing?: A Lot Help from another person to put on and taking off regular  lower body clothing?: A Lot 6 Click Score: 12   End of Session Equipment Utilized During Treatment: Gait belt Nurse Communication: Mobility status;Precautions  Activity Tolerance: Patient tolerated treatment well Patient left: in chair;with call bell/phone within reach  OT Visit Diagnosis: Unsteadiness on feet (R26.81);Other abnormalities of gait and mobility (R26.89);Muscle weakness (generalized) (M62.81);Hemiplegia and hemiparesis;Other symptoms and signs involving cognitive function Hemiplegia - Right/Left: Left Hemiplegia - dominant/non-dominant: Non-Dominant Hemiplegia - caused by: Cerebral infarction                Time: 1116-1131 OT Time Calculation (min): 15 min Charges:  OT General Charges $OT Visit: 1 Visit OT Evaluation $OT Eval Moderate Complexity: 1 Mod G-Codes:     Emera Bussie MSOT, OTR/L Acute Rehab Pager: [phone] Office: [phone]  Theodoro GristCharis M Farley [name] 02/15/2018, 1:25 PM

## 2018-02-15 NOTE — Consult Note (Signed)
Physical Medicine and Rehabilitation Consult Reason for Consult: Sided weakness with facial droop and slurred speech Referring Physician: Dr.Xu   HPI: Jocelyn [name] is a 52 y.o. right-handed female with history of hypertension and anemia as well as tobacco abuse.  Per chart review and family, patient lives alone currently separated.  One level apartment.  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  Her husband can assist but works during the day.  Presented for a 2019 with left-sided weakness and lethargy.  Blood pressure 148/101.  UDS positive amphetamines.  Cranial CT scan reviewed, showing right basal ganglia hemorrhage. Per report, acute right basal ganglia hemorrhage with mild edema and 4 mm left midline shift.  CT Angio of head and neck with no dissection or aneurysm.  Echocardiogram with ejection fraction of 65% no wall motion abnormalities.  Treated with 3% saline for cerebral edema.  Close monitoring of blood pressure.  Plan follow-up CT of the head 02/16/2018. Physical therapy evaluation completed with recommendations of physical medicine rehab consult.   Review of Systems  Constitutional: Negative for chills and fever.  HENT: Negative for hearing loss.   Eyes: Negative for blurred vision.  Respiratory: Negative for shortness of breath.   Cardiovascular: Negative for chest pain, palpitations and leg swelling.  Gastrointestinal: Positive for constipation. Negative for nausea and vomiting.  Genitourinary: Negative for dysuria, flank pain and hematuria.  Musculoskeletal: Positive for myalgias.  Skin: Negative for rash.  Neurological: Positive for speech change, weakness and headaches.  All other systems reviewed and are negative.  Past Medical History:  Diagnosis Date  . Anemia   . Hypertension    Past Surgical History:  Procedure Laterality Date  . MYOMECTOMY     Family History  Problem Relation Age of Onset  . Diabetes Mellitus II  Neg Hx   . Hypertension Neg Hx    Social History:  reports that she has been smoking cigarettes.  She has a 2.00 pack-year smoking history. She has never used smokeless tobacco. She reports that she drinks alcohol. She reports that she does not use drugs. Allergies: No Known Allergies Medications Prior to Admission  Medication Sig Dispense Refill  . acetaminophen (TYLENOL) 500 MG tablet Take 500 mg by mouth every 6 (six) hours as needed for headache (pain).    . ferrous sulfate 325 (65 FE) MG tablet Take 1 tablet (325 mg total) by mouth 2 (two) times daily with a meal. (Patient not taking: Reported on 07/21/2017) 60 tablet 3  . hydrochlorothiazide (MICROZIDE) 12.5 MG capsule Take 1 capsule (12.5 mg total) by mouth daily. 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, Environmental education officer History: Prior Function Level of Independence: Independent Functional Status:  Mobility: Bed Mobility Overal bed mobility: Needs Assistance Bed Mobility: Supine to Sit Supine to sit: Mod assist, HOB elevated General bed mobility comments: assist for L leg and trunk upright with cues for technique and hand placement Transfers Overall transfer level: Needs assistance Equipment used: 1 person hand held assist, 2 person hand held assist Transfers: Sit to/from Stand Sit to Stand: Max assist, Mod assist, +2 physical assistance  General transfer comment: initially with +1 A needed lifting help and block for L leg, mod cues and facilitation for anterior weight shift;  Ambulation/Gait Ambulation/Gait assistance:  Mod assist, +2 physical assistance, Max assist Ambulation Distance (Feet): 3 Feet Assistive device: 2 person hand held assist Gait Pattern/deviations: Step-to pattern, Decreased stride length, Decreased dorsiflexion - left, Decreased stance time - left General Gait Details: assist to block L knee, initially assist to progress L LE, then able to move it once standing on it some, mod cues for posture, assist for weight shift and leaning to L.     ADL:    Cognition: Cognition Overall Cognitive Status: Within Functional Limits for tasks assessed Orientation Level: Oriented X4 Cognition Arousal/Alertness: Awake/alert Behavior During Therapy: WFL for tasks assessed/performed Overall Cognitive Status: Within Functional Limits for tasks assessed  Blood pressure (!) 115/103, pulse 72, temperature 98.6 F (37 C), temperature source Oral, resp. rate 16, height 5\' 7"  (1.702 m), weight 81.2 kg (179 lb 0.2 oz), last menstrual period 01/13/2018, SpO2 100 %. Physical Exam  Vitals reviewed. Constitutional: She appears well-developed and well-nourished.  HENT:  Head: Normocephalic and atraumatic.  Eyes: EOM are normal. Right eye exhibits no discharge. Left eye exhibits no discharge.  Pupils reactive to light  Neck: Normal range of motion. Neck supple. No thyromegaly present.  Cardiovascular: Normal rate, regular rhythm and normal heart sounds.  Respiratory: Effort normal and breath sounds normal. No respiratory distress.  GI: Soft. Bowel sounds are normal. She exhibits no distension.  Musculoskeletal: She exhibits no edema or tenderness.  Neurological:  She provides her name, age and date of birth.   Follows simple commands. Left facial weakness Motor: RUE/RLE: 5/5 proximal to distal LUE: 0/5 proximal to distal LLE: HF 1+/5, distally 0/5 Sensation intact to light touch  Skin: Skin is warm and dry.  Psychiatric: Her affect is blunt.    Results for orders placed or performed during the  hospital encounter of 02/13/18 (from the past 24 hour(s))  Sodium     Status: None   Collection Time: 02/14/18  1:14 PM  Result Value Ref Range   Sodium 143 135 - 145 mmol/L  Sodium     Status: None   Collection Time: 02/14/18  5:11 PM  Result Value Ref Range   Sodium 143 135 - 145 mmol/L  Hemoglobin     Status: Abnormal   Collection Time: 02/14/18  5:11 PM  Result Value Ref Range   Hemoglobin 8.4 (L) 12.0 - 15.0 g/dL  Sodium     Status: None   Collection Time: 02/14/18 10:39 PM  Result Value Ref Range   Sodium 142 135 - 145 mmol/L  CBC     Status: Abnormal   Collection Time: 02/15/18  3:41 AM  Result Value Ref Range   WBC 7.2 4.0 - 10.5 K/uL   RBC 4.86 3.87 - 5.11 MIL/uL   Hemoglobin 8.3 (L) 12.0 - 15.0 g/dL   HCT 16.1 (L) 09.6 - 04.5 %   MCV 62.6 (L) 78.0 - 100.0 fL   MCH 17.1 (L) 26.0 - 34.0 pg   MCHC 27.3 (L) 30.0 - 36.0 g/dL   RDW 40.9 (H) 81.1 - 91.4 %   Platelets 445 (H) 150 - 400 K/uL  Basic metabolic panel     Status: Abnormal   Collection Time: 02/15/18  3:41 AM  Result Value Ref Range   Sodium 142 135 - 145 mmol/L   Potassium 3.3 (L) 3.5 - 5.1 mmol/L  Chloride 108 101 - 111 mmol/L   CO2 24 22 - 32 mmol/L   Glucose, Bld 85 65 - 99 mg/dL   BUN 7 6 - 20 mg/dL   Creatinine, Ser 9.600.75 0.44 - 1.00 mg/dL   Calcium 8.7 (L) 8.9 - 10.3 mg/dL   GFR calc non Af Amer >60 >60 mL/min   GFR calc Af Amer >60 >60 mL/min   Anion gap 10 5 - 15  Magnesium     Status: None   Collection Time: 02/15/18  3:41 AM  Result Value Ref Range   Magnesium 2.0 1.7 - 2.4 mg/dL   Ct Angio Head W Or Wo Contrast  Result Date: 02/14/2018 CLINICAL DATA:  52 y/o F; stroke for follow-up, left-sided weakness. EXAM: CT ANGIOGRAPHY HEAD AND NECK CT HEAD WITHOUT CONTRAST TECHNIQUE: Multidetector CT imaging of the head and neck was performed using the standard protocol during bolus administration of intravenous contrast. Multiplanar CT image reconstructions and MIPs were obtained to evaluate the  vascular anatomy. Carotid stenosis measurements (when applicable) are obtained utilizing NASCET criteria, using the distal internal carotid diameter as the denominator. CONTRAST:  50mL ISOVUE-370 IOPAMIDOL (ISOVUE-370) INJECTION 76% COMPARISON:  02/13/2018 CT head. FINDINGS: CT HEAD FINDINGS Brain: Stable acute hemorrhage within the right basal ganglia measuring 3.7 x 2.0 cm (series 14, image 13). Stable surrounding edema and associated mass effect with partial effacement of right lateral ventricle and 4 mm of right-to-left midline shift. No new acute intracranial hemorrhage, large acute stroke, or focal mass effect of the brain. No extra-axial collection, effacement of basilar cisterns, or herniation. Vascular: As below. Skull: Normal. Negative for fracture or focal lesion. Sinuses: Imaged portions are clear. Orbits: No acute finding. Review of the MIP images confirms the above findings CTA NECK FINDINGS Aortic arch: Standard branching. Imaged portion shows no evidence of aneurysm or dissection. No significant stenosis of the major arch vessel origins. Right carotid system: No evidence of dissection, stenosis (50% or greater) or occlusion. Left carotid system: No evidence of dissection, stenosis (50% or greater) or occlusion. Vertebral arteries: Left dominant. No evidence of dissection, stenosis (50% or greater) or occlusion. Skeleton: Mild cervical spondylosis. No high-grade bony canal stenosis. Other neck: Negative. Upper chest: Negative. Review of the MIP images confirms the above findings CTA HEAD FINDINGS Anterior circulation: No significant stenosis, proximal occlusion, aneurysm, or vascular malformation. Posterior circulation: No significant stenosis, proximal occlusion, aneurysm, or vascular malformation. Venous sinuses: As permitted by contrast timing, patent. Anatomic variants: Right fetal PCA. Right vertebral artery ends in the right PICA. Delayed phase: No abnormal intracranial enhancement. Review of  the MIP images confirms the above findings IMPRESSION: 1. Patent carotid and vertebral arteries. No dissection, aneurysm, or hemodynamically significant stenosis utilizing NASCET criteria. 2. Patent anterior and posterior intracranial circulation. No large vessel occlusion, aneurysm, or significant stenosis. No vascular malformation. 3. Stable hematoma in right basal ganglia measuring up to 3.7 cm. Stable associated edema and mass effect with 4 mm right-to-left midline shift. No new acute intracranial abnormality identified. Electronically Signed   By: Mitzi HansenLance  Furusawa-Stratton M.D.   On: 02/14/2018 01:13   Ct Head Wo Contrast  Result Date: 02/14/2018 CLINICAL DATA:  52 y/o F; stroke for follow-up, left-sided weakness. EXAM: CT ANGIOGRAPHY HEAD AND NECK CT HEAD WITHOUT CONTRAST TECHNIQUE: Multidetector CT imaging of the head and neck was performed using the standard protocol during bolus administration of intravenous contrast. Multiplanar CT image reconstructions and MIPs were obtained to evaluate the vascular anatomy. Carotid stenosis measurements (  when applicable) are obtained utilizing NASCET criteria, using the distal internal carotid diameter as the denominator. CONTRAST:  50mL ISOVUE-370 IOPAMIDOL (ISOVUE-370) INJECTION 76% COMPARISON:  02/13/2018 CT head. FINDINGS: CT HEAD FINDINGS Brain: Stable acute hemorrhage within the right basal ganglia measuring 3.7 x 2.0 cm (series 14, image 13). Stable surrounding edema and associated mass effect with partial effacement of right lateral ventricle and 4 mm of right-to-left midline shift. No new acute intracranial hemorrhage, large acute stroke, or focal mass effect of the brain. No extra-axial collection, effacement of basilar cisterns, or herniation. Vascular: As below. Skull: Normal. Negative for fracture or focal lesion. Sinuses: Imaged portions are clear. Orbits: No acute finding. Review of the MIP images confirms the above findings CTA NECK FINDINGS Aortic  arch: Standard branching. Imaged portion shows no evidence of aneurysm or dissection. No significant stenosis of the major arch vessel origins. Right carotid system: No evidence of dissection, stenosis (50% or greater) or occlusion. Left carotid system: No evidence of dissection, stenosis (50% or greater) or occlusion. Vertebral arteries: Left dominant. No evidence of dissection, stenosis (50% or greater) or occlusion. Skeleton: Mild cervical spondylosis. No high-grade bony canal stenosis. Other neck: Negative. Upper chest: Negative. Review of the MIP images confirms the above findings CTA HEAD FINDINGS Anterior circulation: No significant stenosis, proximal occlusion, aneurysm, or vascular malformation. Posterior circulation: No significant stenosis, proximal occlusion, aneurysm, or vascular malformation. Venous sinuses: As permitted by contrast timing, patent. Anatomic variants: Right fetal PCA. Right vertebral artery ends in the right PICA. Delayed phase: No abnormal intracranial enhancement. Review of the MIP images confirms the above findings IMPRESSION: 1. Patent carotid and vertebral arteries. No dissection, aneurysm, or hemodynamically significant stenosis utilizing NASCET criteria. 2. Patent anterior and posterior intracranial circulation. No large vessel occlusion, aneurysm, or significant stenosis. No vascular malformation. 3. Stable hematoma in right basal ganglia measuring up to 3.7 cm. Stable associated edema and mass effect with 4 mm right-to-left midline shift. No new acute intracranial abnormality identified. Electronically Signed   By: Mitzi Hansen M.D.   On: 02/14/2018 01:13   Ct Angio Neck W Or Wo Contrast  Result Date: 02/14/2018 CLINICAL DATA:  52 y/o F; stroke for follow-up, left-sided weakness. EXAM: CT ANGIOGRAPHY HEAD AND NECK CT HEAD WITHOUT CONTRAST TECHNIQUE: Multidetector CT imaging of the head and neck was performed using the standard protocol during bolus administration  of intravenous contrast. Multiplanar CT image reconstructions and MIPs were obtained to evaluate the vascular anatomy. Carotid stenosis measurements (when applicable) are obtained utilizing NASCET criteria, using the distal internal carotid diameter as the denominator. CONTRAST:  50mL ISOVUE-370 IOPAMIDOL (ISOVUE-370) INJECTION 76% COMPARISON:  02/13/2018 CT head. FINDINGS: CT HEAD FINDINGS Brain: Stable acute hemorrhage within the right basal ganglia measuring 3.7 x 2.0 cm (series 14, image 13). Stable surrounding edema and associated mass effect with partial effacement of right lateral ventricle and 4 mm of right-to-left midline shift. No new acute intracranial hemorrhage, large acute stroke, or focal mass effect of the brain. No extra-axial collection, effacement of basilar cisterns, or herniation. Vascular: As below. Skull: Normal. Negative for fracture or focal lesion. Sinuses: Imaged portions are clear. Orbits: No acute finding. Review of the MIP images confirms the above findings CTA NECK FINDINGS Aortic arch: Standard branching. Imaged portion shows no evidence of aneurysm or dissection. No significant stenosis of the major arch vessel origins. Right carotid system: No evidence of dissection, stenosis (50% or greater) or occlusion. Left carotid system: No evidence of dissection, stenosis (50% or  greater) or occlusion. Vertebral arteries: Left dominant. No evidence of dissection, stenosis (50% or greater) or occlusion. Skeleton: Mild cervical spondylosis. No high-grade bony canal stenosis. Other neck: Negative. Upper chest: Negative. Review of the MIP images confirms the above findings CTA HEAD FINDINGS Anterior circulation: No significant stenosis, proximal occlusion, aneurysm, or vascular malformation. Posterior circulation: No significant stenosis, proximal occlusion, aneurysm, or vascular malformation. Venous sinuses: As permitted by contrast timing, patent. Anatomic variants: Right fetal PCA. Right  vertebral artery ends in the right PICA. Delayed phase: No abnormal intracranial enhancement. Review of the MIP images confirms the above findings IMPRESSION: 1. Patent carotid and vertebral arteries. No dissection, aneurysm, or hemodynamically significant stenosis utilizing NASCET criteria. 2. Patent anterior and posterior intracranial circulation. No large vessel occlusion, aneurysm, or significant stenosis. No vascular malformation. 3. Stable hematoma in right basal ganglia measuring up to 3.7 cm. Stable associated edema and mass effect with 4 mm right-to-left midline shift. No new acute intracranial abnormality identified. Electronically Signed   By: Mitzi Hansen M.D.   On: 02/14/2018 01:13    Assessment/Plan: Diagnosis: Right basal ganglia hemorrhage Labs and images independently reviewed.  Records reviewed and summated above.  1. Does the need for close, 24 hr/day medical supervision in concert with the patient's rehab needs make it unreasonable for this patient to be served in a less intensive setting? Yes  2. Co-Morbidities requiring supervision/potential complications: HTN (monitor and provide prns in accordance with increased physical exertion and pain), anemia (transfuse if necessary to ensure appropriate perfusion for increased activity tolerance), tobacco and amphetamines abuse (counsel), hypokalemia (continue to monitor and replete as necessary) 3. Due to safety, disease management and patient education, does the patient require 24 hr/day rehab nursing? Yes 4. Does the patient require coordinated care of a physician, rehab nurse, PT (1-2 hrs/day, 5 days/week) and OT (1-2 hrs/day, 5 days/week) to address physical and functional deficits in the context of the above medical diagnosis(es)? Yes Addressing deficits in the following areas: balance, endurance, locomotion, strength, transferring, bathing, dressing, toileting and psychosocial support 5. Can the patient actively  participate in an intensive therapy program of at least 3 hrs of therapy per day at least 5 days per week? Yes 6. The potential for patient to make measurable gains while on inpatient rehab is excellent 7. Anticipated functional outcomes upon discharge from inpatient rehab are supervision and min assist  with PT, supervision and min assist with OT, n/a with SLP. 8. Estimated rehab length of stay to reach the above functional goals is: 16-19 days. 9. Anticipated D/C setting: Home 10. Anticipated post D/C treatments: HH therapy and Home excercise program 11. Overall Rehab/Functional Prognosis: good  RECOMMENDATIONS: This patient's condition is appropriate for continued rehabilitative care in the following setting: CIR after completion of medical workup if caregiver support available at discharge (Husband states he works). Patient has agreed to participate in recommended program. Yes Note that insurance prior authorization may be required for reimbursement for recommended care.  Comment: Rehab Admissions Coordinator to follow up.  Maryla Morrow, MD, ABPMR [facility] Rossetti Angiulli, PA-C 02/15/2018

## 2018-02-16 ENCOUNTER — Inpatient Hospital Stay (HOSPITAL_COMMUNITY): Payer: 59

## 2018-02-16 ENCOUNTER — Other Ambulatory Visit: Payer: Self-pay

## 2018-02-16 ENCOUNTER — Encounter (HOSPITAL_COMMUNITY): Payer: Self-pay | Admitting: General Practice

## 2018-02-16 DIAGNOSIS — G936 Cerebral edema: Secondary | ICD-10-CM | POA: Diagnosis present

## 2018-02-16 DIAGNOSIS — E785 Hyperlipidemia, unspecified: Secondary | ICD-10-CM | POA: Diagnosis present

## 2018-02-16 LAB — BASIC METABOLIC PANEL
Anion gap: 11 (ref 5–15)
BUN: 7 mg/dL (ref 6–20)
CHLORIDE: 105 mmol/L (ref 101–111)
CO2: 24 mmol/L (ref 22–32)
CREATININE: 0.75 mg/dL (ref 0.44–1.00)
Calcium: 8.7 mg/dL — ABNORMAL LOW (ref 8.9–10.3)
GFR calc non Af Amer: >60 mL/min (ref >60)
Glucose, Bld: 89 mg/dL (ref 65–99)
POTASSIUM: 3.3 mmol/L — AB (ref 3.5–5.1)
Sodium: 140 mmol/L (ref 135–145)

## 2018-02-16 LAB — CBC
HCT: 29.8 % — ABNORMAL LOW (ref 36.0–46.0)
HEMOGLOBIN: 8.2 g/dL — AB (ref 12.0–15.0)
MCH: 17.3 pg — ABNORMAL LOW (ref 26.0–34.0)
MCHC: 27.5 g/dL — ABNORMAL LOW (ref 30.0–36.0)
MCV: 63 fL — ABNORMAL LOW (ref 78.0–100.0)
Platelets: 422 10*3/uL — ABNORMAL HIGH (ref 150–400)
RBC: 4.73 MIL/uL (ref 3.87–5.11)
RDW: 21.6 % — ABNORMAL HIGH (ref 11.5–15.5)
WBC: 7.5 10*3/uL (ref 4.0–10.5)

## 2018-02-16 LAB — GLUCOSE, CAPILLARY: Glucose-Capillary: 97 mg/dL (ref 65–99)

## 2018-02-16 MED ORDER — LISINOPRIL 20 MG PO TABS
20.0000 mg | ORAL_TABLET | Freq: Two times a day (BID) | ORAL | Status: DC
  Administered 2018-02-16 – 2018-02-17 (×3): 20 mg via ORAL
  Filled 2018-02-16 (×3): qty 1

## 2018-02-16 MED ORDER — AMLODIPINE BESYLATE 10 MG PO TABS
10.0000 mg | ORAL_TABLET | Freq: Every day | ORAL | Status: DC
  Administered 2018-02-16 – 2018-02-17 (×2): 10 mg via ORAL
  Filled 2018-02-16 (×2): qty 1

## 2018-02-16 MED ORDER — POTASSIUM CHLORIDE CRYS ER 20 MEQ PO TBCR
20.0000 meq | EXTENDED_RELEASE_TABLET | Freq: Two times a day (BID) | ORAL | Status: AC
  Administered 2018-02-16 (×2): 20 meq via ORAL
  Filled 2018-02-16 (×2): qty 1

## 2018-02-16 NOTE — Progress Notes (Signed)
Inpatient Rehabilitation  Met with patient at bedside to discuss team's recommendation for IP Rehab.  Shared booklets and answered questions.  Plan to follow for timing of medical readiness, insurance authorization, and IP Rehab bed availability.  Call if questions.    Carmelia Roller., CCC/SLP Admission Coordinator  [facility]  Cell [phone]

## 2018-02-16 NOTE — Progress Notes (Addendum)
STROKE TEAM PROGRESS NOTE   SUBJECTIVE (INTERVAL HISTORY) No family at the bedside.  she still feels her condition is unchanged from yesterday. BP up during the night to 170s. HTN meds increased. Ready to go to rehab.   OBJECTIVE Temp:  [98.3 F (36.8 C)-99 F (37.2 C)] 98.5 F (36.9 C) (04/11 0821) Pulse Rate:  [63-89] 71 (04/11 0821) Cardiac Rhythm: Normal sinus rhythm (04/11 0700) Resp:  [16-18] 18 (04/11 0821) BP: (124-174)/(76-110) 153/79 (04/11 0821) SpO2:  [95 %-100 %] 100 % (04/11 0500)  Recent Labs  Lab 02/13/18 1040  GLUCAP 106*   Recent Labs  Lab 02/13/18 1045  02/13/18 1123  02/14/18 0613 02/14/18 1314 02/14/18 1711 02/14/18 2239 02/15/18 0341 02/16/18 0717  NA 140  --  139   < > 146* 143 143 142 142 140  K 3.8  --  3.9  --  3.3*  --   --   --  3.3* 3.3*  CL 103  --  103  --  114*  --   --   --  108 105  CO2  --   --  17*  --  23  --   --   --  24 24  GLUCOSE 111*  --  96  --  100*  --   --   --  85 89  BUN 14  --  12  --  6  --   --   --  7 7  CREATININE 0.60  --  0.69  --  0.80  --   --   --  0.75 0.75  CALCIUM  --    < > 9.3  --  8.5*  --   --   --  8.7* 8.7*  MG  --   --   --   --   --   --   --   --  2.0  --    < > = values in this interval not displayed.   Recent Labs  Lab 02/13/18 1123  AST 35  ALT 16  ALKPHOS 82  BILITOT 0.6  PROT 7.8  ALBUMIN 3.8   Recent Labs  Lab 02/13/18 1045 02/13/18 1123 02/14/18 0613 02/14/18 1711 02/15/18 0341 02/16/18 0717  WBC  --  11.9* 8.5  --  7.2 7.5  NEUTROABS  --  10.2*  --   --   --   --   HGB 12.2 9.2* 8.3* 8.4* 8.3* 8.2*  HCT 36.0 33.4* 30.2*  --  30.4* 29.8*  MCV  --  62.2* 61.8*  --  62.6* 63.0*  PLT  --  462* 490*  --  445* 422*   Recent Labs  Lab 02/13/18 1324  CKTOTAL 1,541*  CKMB 14.3*   No results for input(s): LABPROT, INR in the last 72 hours. Recent Labs    02/13/18 1235  COLORURINE YELLOW  LABSPEC 1.030  PHURINE 5.0  GLUCOSEU NEGATIVE  HGBUR LARGE*  BILIRUBINUR NEGATIVE   KETONESUR 5*  PROTEINUR >=300*  NITRITE NEGATIVE  LEUKOCYTESUR TRACE*       Component Value Date/Time   CHOL 175 02/13/2018 1123   TRIG 26 02/13/2018 1123   HDL 55 02/13/2018 1123   CHOLHDL 3.2 02/13/2018 1123   VLDL 5 02/13/2018 1123   LDLCALC 115 (H) 02/13/2018 1123   Lab Results  Component Value Date   HGBA1C 5.3 02/13/2018      Component Value Date/Time   LABOPIA NONE DETECTED 02/13/2018 1235   COCAINSCRNUR  NONE DETECTED 02/13/2018 1235   LABBENZ NONE DETECTED 02/13/2018 1235   AMPHETMU POSITIVE (A) 02/13/2018 1235   THCU NONE DETECTED 02/13/2018 1235   LABBARB NONE DETECTED 02/13/2018 1235    Recent Labs  Lab 02/13/18 1123  ETH <10     Ct Angio Head W Or Wo Contrast  Result Date: 02/14/2018 CLINICAL DATA:  52 y/o F; stroke for follow-up, left-sided weakness. EXAM: CT ANGIOGRAPHY HEAD AND NECK CT HEAD WITHOUT CONTRAST TECHNIQUE: Multidetector CT imaging of the head and neck was performed using the standard protocol during bolus administration of intravenous contrast. Multiplanar CT image reconstructions and MIPs were obtained to evaluate the vascular anatomy. Carotid stenosis measurements (when applicable) are obtained utilizing NASCET criteria, using the distal internal carotid diameter as the denominator. CONTRAST:  50mL ISOVUE-370 IOPAMIDOL (ISOVUE-370) INJECTION 76% COMPARISON:  02/13/2018 CT head. FINDINGS: CT HEAD FINDINGS Brain: Stable acute hemorrhage within the right basal ganglia measuring 3.7 x 2.0 cm (series 14, image 13). Stable surrounding edema and associated mass effect with partial effacement of right lateral ventricle and 4 mm of right-to-left midline shift. No new acute intracranial hemorrhage, large acute stroke, or focal mass effect of the brain. No extra-axial collection, effacement of basilar cisterns, or herniation. Vascular: As below. Skull: Normal. Negative for fracture or focal lesion. Sinuses: Imaged portions are clear. Orbits: No acute finding.  Review of the MIP images confirms the above findings CTA NECK FINDINGS Aortic arch: Standard branching. Imaged portion shows no evidence of aneurysm or dissection. No significant stenosis of the major arch vessel origins. Right carotid system: No evidence of dissection, stenosis (50% or greater) or occlusion. Left carotid system: No evidence of dissection, stenosis (50% or greater) or occlusion. Vertebral arteries: Left dominant. No evidence of dissection, stenosis (50% or greater) or occlusion. Skeleton: Mild cervical spondylosis. No high-grade bony canal stenosis. Other neck: Negative. Upper chest: Negative. Review of the MIP images confirms the above findings CTA HEAD FINDINGS Anterior circulation: No significant stenosis, proximal occlusion, aneurysm, or vascular malformation. Posterior circulation: No significant stenosis, proximal occlusion, aneurysm, or vascular malformation. Venous sinuses: As permitted by contrast timing, patent. Anatomic variants: Right fetal PCA. Right vertebral artery ends in the right PICA. Delayed phase: No abnormal intracranial enhancement. Review of the MIP images confirms the above findings IMPRESSION: 1. Patent carotid and vertebral arteries. No dissection, aneurysm, or hemodynamically significant stenosis utilizing NASCET criteria. 2. Patent anterior and posterior intracranial circulation. No large vessel occlusion, aneurysm, or significant stenosis. No vascular malformation. 3. Stable hematoma in right basal ganglia measuring up to 3.7 cm. Stable associated edema and mass effect with 4 mm right-to-left midline shift. No new acute intracranial abnormality identified. Electronically Signed   By: Mitzi Hansen M.D.   On: 02/14/2018 01:13   Ct Head Wo Contrast  Result Date: 02/16/2018 CLINICAL DATA:  Followup intracranial hemorrhage. EXAM: CT HEAD WITHOUT CONTRAST TECHNIQUE: Contiguous axial images were obtained from the base of the skull through the vertex without  intravenous contrast. COMPARISON:  02/13/2018 FINDINGS: Brain: No additional hemorrhage related to the parenchymal hemorrhage or hemorrhagic infarction in the right basal ganglia/external capsule region. Maximal dimension of the hyperdense clot measures 3.8 cm. Surrounding vasogenic edema is slightly increased as expected. Similar mass effect with flattening of the right lateral ventricle and right-to-left midline shift of 5 mm. No new insult. No other hemorrhage. No subarachnoid or intraventricular penetration. Vascular: No abnormal vascular finding. Skull: Normal Sinuses/Orbits: Clear/normal Other: None IMPRESSION: No increased bleeding in the right lateral basal  ganglia/external capsule region. Maximal hematoma dimension is 3.8 cm. Slightly more surrounding vasogenic edema with right to left shift of 5 mm. Electronically Signed   By: Paulina Fusi M.D.   On: 02/16/2018 07:43   Ct Head Wo Contrast  Result Date: 02/14/2018 CLINICAL DATA:  52 y/o F; stroke for follow-up, left-sided weakness. EXAM: CT ANGIOGRAPHY HEAD AND NECK CT HEAD WITHOUT CONTRAST TECHNIQUE: Multidetector CT imaging of the head and neck was performed using the standard protocol during bolus administration of intravenous contrast. Multiplanar CT image reconstructions and MIPs were obtained to evaluate the vascular anatomy. Carotid stenosis measurements (when applicable) are obtained utilizing NASCET criteria, using the distal internal carotid diameter as the denominator. CONTRAST:  50mL ISOVUE-370 IOPAMIDOL (ISOVUE-370) INJECTION 76% COMPARISON:  02/13/2018 CT head. FINDINGS: CT HEAD FINDINGS Brain: Stable acute hemorrhage within the right basal ganglia measuring 3.7 x 2.0 cm (series 14, image 13). Stable surrounding edema and associated mass effect with partial effacement of right lateral ventricle and 4 mm of right-to-left midline shift. No new acute intracranial hemorrhage, large acute stroke, or focal mass effect of the brain. No extra-axial  collection, effacement of basilar cisterns, or herniation. Vascular: As below. Skull: Normal. Negative for fracture or focal lesion. Sinuses: Imaged portions are clear. Orbits: No acute finding. Review of the MIP images confirms the above findings CTA NECK FINDINGS Aortic arch: Standard branching. Imaged portion shows no evidence of aneurysm or dissection. No significant stenosis of the major arch vessel origins. Right carotid system: No evidence of dissection, stenosis (50% or greater) or occlusion. Left carotid system: No evidence of dissection, stenosis (50% or greater) or occlusion. Vertebral arteries: Left dominant. No evidence of dissection, stenosis (50% or greater) or occlusion. Skeleton: Mild cervical spondylosis. No high-grade bony canal stenosis. Other neck: Negative. Upper chest: Negative. Review of the MIP images confirms the above findings CTA HEAD FINDINGS Anterior circulation: No significant stenosis, proximal occlusion, aneurysm, or vascular malformation. Posterior circulation: No significant stenosis, proximal occlusion, aneurysm, or vascular malformation. Venous sinuses: As permitted by contrast timing, patent. Anatomic variants: Right fetal PCA. Right vertebral artery ends in the right PICA. Delayed phase: No abnormal intracranial enhancement. Review of the MIP images confirms the above findings IMPRESSION: 1. Patent carotid and vertebral arteries. No dissection, aneurysm, or hemodynamically significant stenosis utilizing NASCET criteria. 2. Patent anterior and posterior intracranial circulation. No large vessel occlusion, aneurysm, or significant stenosis. No vascular malformation. 3. Stable hematoma in right basal ganglia measuring up to 3.7 cm. Stable associated edema and mass effect with 4 mm right-to-left midline shift. No new acute intracranial abnormality identified. Electronically Signed   By: Mitzi Hansen M.D.   On: 02/14/2018 01:13   Ct Angio Neck W Or Wo  Contrast  Result Date: 02/14/2018 CLINICAL DATA:  53 y/o F; stroke for follow-up, left-sided weakness. EXAM: CT ANGIOGRAPHY HEAD AND NECK CT HEAD WITHOUT CONTRAST TECHNIQUE: Multidetector CT imaging of the head and neck was performed using the standard protocol during bolus administration of intravenous contrast. Multiplanar CT image reconstructions and MIPs were obtained to evaluate the vascular anatomy. Carotid stenosis measurements (when applicable) are obtained utilizing NASCET criteria, using the distal internal carotid diameter as the denominator. CONTRAST:  50mL ISOVUE-370 IOPAMIDOL (ISOVUE-370) INJECTION 76% COMPARISON:  02/13/2018 CT head. FINDINGS: CT HEAD FINDINGS Brain: Stable acute hemorrhage within the right basal ganglia measuring 3.7 x 2.0 cm (series 14, image 13). Stable surrounding edema and associated mass effect with partial effacement of right lateral ventricle and 4 mm of  right-to-left midline shift. No new acute intracranial hemorrhage, large acute stroke, or focal mass effect of the brain. No extra-axial collection, effacement of basilar cisterns, or herniation. Vascular: As below. Skull: Normal. Negative for fracture or focal lesion. Sinuses: Imaged portions are clear. Orbits: No acute finding. Review of the MIP images confirms the above findings CTA NECK FINDINGS Aortic arch: Standard branching. Imaged portion shows no evidence of aneurysm or dissection. No significant stenosis of the major arch vessel origins. Right carotid system: No evidence of dissection, stenosis (50% or greater) or occlusion. Left carotid system: No evidence of dissection, stenosis (50% or greater) or occlusion. Vertebral arteries: Left dominant. No evidence of dissection, stenosis (50% or greater) or occlusion. Skeleton: Mild cervical spondylosis. No high-grade bony canal stenosis. Other neck: Negative. Upper chest: Negative. Review of the MIP images confirms the above findings CTA HEAD FINDINGS Anterior  circulation: No significant stenosis, proximal occlusion, aneurysm, or vascular malformation. Posterior circulation: No significant stenosis, proximal occlusion, aneurysm, or vascular malformation. Venous sinuses: As permitted by contrast timing, patent. Anatomic variants: Right fetal PCA. Right vertebral artery ends in the right PICA. Delayed phase: No abnormal intracranial enhancement. Review of the MIP images confirms the above findings IMPRESSION: 1. Patent carotid and vertebral arteries. No dissection, aneurysm, or hemodynamically significant stenosis utilizing NASCET criteria. 2. Patent anterior and posterior intracranial circulation. No large vessel occlusion, aneurysm, or significant stenosis. No vascular malformation. 3. Stable hematoma in right basal ganglia measuring up to 3.7 cm. Stable associated edema and mass effect with 4 mm right-to-left midline shift. No new acute intracranial abnormality identified. Electronically Signed   By: Mitzi Hansen M.D.   On: 02/14/2018 01:13   Dg Chest Port 1 View  Result Date: 02/13/2018 CLINICAL DATA:  Intracranial hemorrhage.  Hypertension. EXAM: PORTABLE CHEST 1 VIEW COMPARISON:  November 05, 2015 FINDINGS: Lungs are clear. Heart size and pulmonary vascularity are normal. No adenopathy. No bone lesions. IMPRESSION: No edema or consolidation. Electronically Signed   By: Bretta Bang III M.D.   On: 02/13/2018 11:32   Ct Head Code Stroke Wo Contrast  Result Date: 02/13/2018 CLINICAL DATA:  Code stroke.  Left-sided weakness. EXAM: CT HEAD WITHOUT CONTRAST TECHNIQUE: Contiguous axial images were obtained from the base of the skull through the vertex without intravenous contrast. COMPARISON:  None. FINDINGS: Brain: An acute parenchymal hemorrhage centered in the lateral aspect of the right lentiform nucleus/external capsule measures 3.9 x 2.0 x 2.7 cm (approximate volume of 11 cc). There is mild surrounding edema with mass effect on the basal ganglia  and right lateral ventricle. Leftward midline shift measures 4 mm. There is no ventriculomegaly. No acute large territory infarct is identified separate from the hemorrhage. Cerebral volume is within normal limits for age. There is no extra-axial fluid collection. Vascular: No hyperdense vessel. Skull: No fracture focal osseous lesion. Sinuses/Orbits: Visualized paranasal sinuses and mastoid air cells are clear. Orbits are unremarkable. Other: None. ASPECTS [facility] Stroke Program Early CT Score) Not scored due to the presence of hemorrhage. 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 Critical Value/emergent results were called by telephone at the time of interpretation on 02/13/2018 at 11:01 am to Dr. Caryl Pina, who verbally acknowledged these results. Electronically Signed   By: Sebastian Ache M.D.   On: 02/13/2018 11:01    2D Echocardiogram  - Left ventricle: The cavity size was normal. Wall thickness was normal. Systolic function was normal. The estimated ejection fraction was  in the range of 60% to 65%. Wall motion was normal; there were no regional wall motion abnormalities. Left ventricular diastolic function parameters were normal. - Aortic valve: Sclerosis without stenosis. Transvalvular velocity was minimally increased. There was no regurgitation. Mean gradient (S): 9 mm Hg. Peak gradient (S): 16 mm Hg. - Left atrium: The atrium was normal in size. - Right atrium: The atrium was mildly dilated. - Inferior vena cava: The vessel was normal in size. The respirophasic diameter changes were in the normal range (>= 50%), consistent with normal central venous pressure. Impressions:  LVEF 60-65%, normal wall thickness, normal wall motion, normal diastolic function, aortic valve sclerosis, normal LA size, mild RAE, normal IVC.   PHYSICAL EXAM General - Well nourished, well developed, in no apparent distress. Cardiovascular - Regular rate and  rhythm with no murmur.  Mental Status -  Level of arousal and orientation to time, place, and person were intact. Language including expression, naming, repetition, comprehension was assessed and found intact.  Cranial Nerves II - XII - II - Visual field intact OU. III, IV, VI - Extraocular movements intact. V - Facial sensation intact bilaterally. VII - left facial droop. VIII - Hearing & vestibular intact bilaterally. X - Palate elevates symmetrically. XI - Chin turning & shoulder shrug intact bilaterally. XII - Tongue protrusion intact.  Motor Strength - The patient's strength was normal in RUE and RLE, but 2/5 LUE and 3/5 LLE.  Bulk was normal and fasciculations were absent.   Motor Tone - Muscle tone was assessed at the neck and appendages and was normal.  Sensory - Light touch, temperature/pinprick were assessed and were symmetrical.    Coordination - The patient had normal movements in the right hand with no ataxia or dysmetria.  Tremor was absent.  Plantars:  R downgoing, L upgoing  Gait and Station - deferred.  No significant change in exam from yesterday   ASSESSMENT/PLAN [name] is a 52 y.o. female with history of HTN and anemia admitted for found down, left sided weakness, left facial droop and slurry speech. No tPA given due to ICH.    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 No increase in hmg. Mild increase in surrounding edema (expected)  2D Echo  EF 60-65%. No source of embolus   LDL 115  HgbA1c 5.3  SCDs for VTE prophylaxis Fall precautions  Diet Heart Room service appropriate? Yes; Fluid consistency: Thin   No antithrombotic prior to admission, now on No antithrombotic due to ICH  Ongoing aggressive stroke risk factor  management  Therapy recommendations:  CIR. Rehab admissions coordinator is following  Disposition:  Pending  Cerebral edema  CT showed 4mm midline shift  Repeat CT 4/9 stable  Repeat CT head 4/11 stable. No increase in hmg. Mild increase in surrounding edema (expected)  Treated with 3% saline, now off  Na 140  Hypertensive Emergency BP as high as 172/97 Treated with cleviprex, now off  BP goal < 160  BP up during the night  Increased norvasc 5mg  to 10 and lisino 20 qd to bid  Discussed with RN. To call for SBP > 160  Hyperlipidemia  Home meds:  none   LDL 115, goal < 70  Add lipitor 20 mg daily  at discharge  Anemia   Found in 07/2017 with Hb 6.8 s/p transfusion  Iron pill as home meds  Hb 12.2->9.2->8.3->8.4->8.2 (stable)  Iron panel Fe 9 L, TIBC 350, Sat 3 L, Fer 5 L   PRBC if needed  Continue iron tabs  Tobacco abuse  Current smoker  Smoking cessation counseling provided  Pt is willing to quit  Other Stroke Risk Factors  UDS positive for amphetamine  Other Active Problems  Hypokalemia 3.3 - supplement - Mg normal  Hospital day # 3  Annie Main, MSN, APRN, ANVP-BC, AGPCNP-BC Advanced Practice Stroke Nurse [facility] See Amion for Schedule & Pager information 02/16/2018 11:36 AM   ATTENDING NOTE: I reviewed above note and agree with the assessment and plan. I have made any additions or clarifications directly to the above note. Pt was seen and examined.   Pt neuro stable, no acute event overnight. No HA, left UE 2/5 and improved from yesterday, LLE 3/5. Repeat CT head no hematoma expansion, slight midline shift but no need intervene. BP still on the high end, increased amlodipine and lisinopril dose. Hb stable on iron pills. Pending CIR placement.  Marvel Plan, MD PhD Stroke Neurology 02/16/2018 3:47 PM       To contact Stroke Continuity provider, please refer to [email or web address]. After hours, contact General Neurology

## 2018-02-16 NOTE — Discharge Summary (Addendum)
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 Anemia   . Hypertension   . ICH (intracerebral hemorrhage) (HCC) 02/2018   Past Surgical History:  Procedure Laterality Date  . MYOMECTOMY      Medications to be continued on Rehab Allergies as of 02/17/2018   No Known Allergies     Medication List    STOP taking these medications   hydrochlorothiazide 12.5 MG capsule Commonly known as:  MICROZIDE     TAKE these medications   acetaminophen 500 MG tablet Commonly known as:  TYLENOL Take 500 mg by mouth every 6 (six) hours as needed for headache (pain).   amLODipine 10 MG tablet Commonly known as:  NORVASC Take 1 tablet (10 mg total) by mouth daily. Start taking on:  02/18/2018   cyanocobalamin 1000 MCG tablet Take 1 tablet (1,000 mcg total) by mouth daily. Start taking on:  02/18/2018   ferrous sulfate 325 (65 FE) MG tablet Take 1 tablet (325 mg total) by mouth 3 (three) times daily with meals. What changed:  when to take this   lisinopril 20 MG tablet Commonly known as:  PRINIVIL,ZESTRIL Take 1 tablet (20 mg total) by mouth 2 (two) times daily.   senna-docusate 8.6-50 MG tablet Commonly known as:  Senokot-S Take 1 tablet by mouth 2 (two) times daily.      .  stroke: mapping our early stages of recovery book   Does not apply Once  . amLODipine  10 mg Oral Daily  . ferrous sulfate  325 mg  Oral TID WC  . labetalol  10 mg Intravenous Once  . lisinopril  20 mg Oral BID  . senna-docusate  1 tablet Oral BID  . vitamin B-12  1,000 mcg Oral Daily    LABORATORY STUDIES CBC    Component Value Date/Time   WBC 8.2 02/17/2018 0452   RBC 4.74 02/17/2018 0452   HGB 8.1 (L) 02/17/2018 0452   HCT 29.7 (L) 02/17/2018 0452   PLT 416 (H) 02/17/2018 0452   MCV 62.7 (L) 02/17/2018 0452   MCV 80.4 12/09/2013 0823   MCH 17.1 (L) 02/17/2018 0452   MCHC 27.3 (L) 02/17/2018 0452   RDW 21.6 (H) 02/17/2018 0452   LYMPHSABS 1.0 02/13/2018 1123   MONOABS 0.7 02/13/2018 1123   EOSABS 0.0 02/13/2018 1123   BASOSABS 0.0 02/13/2018 1123   CMP    Component Value Date/Time   NA 140 02/17/2018 0452   K 3.8 02/17/2018 0452   CL 106 02/17/2018 0452   CO2 24 02/17/2018 0452   GLUCOSE 74 02/17/2018 0452   BUN 10 02/17/2018 0452   CREATININE 0.80 02/17/2018 0452   CREATININE 0.92 04/17/2012 1913   CALCIUM 8.8 (L) 02/17/2018 0452   PROT 7.8 02/13/2018 1123   ALBUMIN 3.8 02/13/2018 1123   AST 35 02/13/2018 1123   ALT 16 02/13/2018  1123   ALKPHOS 82 02/13/2018 1123   BILITOT 0.6 02/13/2018 1123   GFRNONAA >60 02/17/2018 0452   GFRAA >60 02/17/2018 0452   COAGS Lab Results  Component Value Date   INR 1.06 02/13/2018   INR 1.0 11/13/2008   Lipid Panel    Component Value Date/Time   CHOL 175 02/13/2018 1123   TRIG 26 02/13/2018 1123   HDL 55 02/13/2018 1123   CHOLHDL 3.2 02/13/2018 1123   VLDL 5 02/13/2018 1123   LDLCALC 115 (H) 02/13/2018 1123   HgbA1C  Lab Results  Component Value Date   HGBA1C 5.3 02/13/2018   Urinalysis    Component Value Date/Time   COLORURINE YELLOW 02/13/2018 1235   APPEARANCEUR CLOUDY (A) 02/13/2018 1235   LABSPEC 1.030 02/13/2018 1235   PHURINE 5.0 02/13/2018 1235   GLUCOSEU NEGATIVE 02/13/2018 1235   HGBUR LARGE (A) 02/13/2018 1235   BILIRUBINUR NEGATIVE 02/13/2018 1235   KETONESUR 5 (A) 02/13/2018 1235   PROTEINUR >=300 (A) 02/13/2018 1235    NITRITE NEGATIVE 02/13/2018 1235   LEUKOCYTESUR TRACE (A) 02/13/2018 1235   Urine Drug Screen     Component Value Date/Time   LABOPIA NONE DETECTED 02/13/2018 1235   COCAINSCRNUR NONE DETECTED 02/13/2018 1235   LABBENZ NONE DETECTED 02/13/2018 1235   AMPHETMU POSITIVE (A) 02/13/2018 1235   THCU NONE DETECTED 02/13/2018 1235   LABBARB NONE DETECTED 02/13/2018 1235    Alcohol Level    Component Value Date/Time   ETH <10 02/13/2018 1123     SIGNIFICANT DIAGNOSTIC STUDIES Ct Angio Head W Or Wo Contrast  Result Date: 02/14/2018 CLINICAL DATA:  52 y/o F; stroke for follow-up, left-sided weakness. EXAM: CT ANGIOGRAPHY HEAD AND NECK CT HEAD WITHOUT CONTRAST TECHNIQUE: Multidetector CT imaging of the head and neck was performed using the standard protocol during bolus administration of intravenous contrast. Multiplanar CT image reconstructions and MIPs were obtained to evaluate the vascular anatomy. Carotid stenosis measurements (when applicable) are obtained utilizing NASCET criteria, using the distal internal carotid diameter as the denominator. CONTRAST:  50mL ISOVUE-370 IOPAMIDOL (ISOVUE-370) INJECTION 76% COMPARISON:  02/13/2018 CT head. FINDINGS: CT HEAD FINDINGS Brain: Stable acute hemorrhage within the right basal ganglia measuring 3.7 x 2.0 cm (series 14, image 13). Stable surrounding edema and associated mass effect with partial effacement of right lateral ventricle and 4 mm of right-to-left midline shift. No new acute intracranial hemorrhage, large acute stroke, or focal mass effect of the brain. No extra-axial collection, effacement of basilar cisterns, or herniation. Vascular: As below. Skull: Normal. Negative for fracture or focal lesion. Sinuses: Imaged portions are clear. Orbits: No acute finding. Review of the MIP images confirms the above findings CTA NECK FINDINGS Aortic arch: Standard branching. Imaged portion shows no evidence of aneurysm or dissection. No significant stenosis of  the major arch vessel origins. Right carotid system: No evidence of dissection, stenosis (50% or greater) or occlusion. Left carotid system: No evidence of dissection, stenosis (50% or greater) or occlusion. Vertebral arteries: Left dominant. No evidence of dissection, stenosis (50% or greater) or occlusion. Skeleton: Mild cervical spondylosis. No high-grade bony canal stenosis. Other neck: Negative. Upper chest: Negative. Review of the MIP images confirms the above findings CTA HEAD FINDINGS Anterior circulation: No significant stenosis, proximal occlusion, aneurysm, or vascular malformation. Posterior circulation: No significant stenosis, proximal occlusion, aneurysm, or vascular malformation. Venous sinuses: As permitted by contrast timing, patent. Anatomic variants: Right fetal PCA. Right vertebral artery ends in the right PICA. Delayed phase: No abnormal intracranial  enhancement. Review of the MIP images confirms the above findings IMPRESSION: 1. Patent carotid and vertebral arteries. No dissection, aneurysm, or hemodynamically significant stenosis utilizing NASCET criteria. 2. Patent anterior and posterior intracranial circulation. No large vessel occlusion, aneurysm, or significant stenosis. No vascular malformation. 3. Stable hematoma in right basal ganglia measuring up to 3.7 cm. Stable associated edema and mass effect with 4 mm right-to-left midline shift. No new acute intracranial abnormality identified. Electronically Signed   By: Mitzi Hansen M.D.   On: 02/14/2018 01:13   Ct Head Wo Contrast  Result Date: 02/16/2018 CLINICAL DATA:  Followup intracranial hemorrhage. EXAM: CT HEAD WITHOUT CONTRAST TECHNIQUE: Contiguous axial images were obtained from the base of the skull through the vertex without intravenous contrast. COMPARISON:  02/13/2018 FINDINGS: Brain: No additional hemorrhage related to the parenchymal hemorrhage or hemorrhagic infarction in the right basal ganglia/external  capsule region. Maximal dimension of the hyperdense clot measures 3.8 cm. Surrounding vasogenic edema is slightly increased as expected. Similar mass effect with flattening of the right lateral ventricle and right-to-left midline shift of 5 mm. No new insult. No other hemorrhage. No subarachnoid or intraventricular penetration. Vascular: No abnormal vascular finding. Skull: Normal Sinuses/Orbits: Clear/normal Other: None IMPRESSION: No increased bleeding in the right lateral basal ganglia/external capsule region. Maximal hematoma dimension is 3.8 cm. Slightly more surrounding vasogenic edema with right to left shift of 5 mm. Electronically Signed   By: Paulina Fusi M.D.   On: 02/16/2018 07:43   Ct Head Wo Contrast  Result Date: 02/14/2018 CLINICAL DATA:  52 y/o F; stroke for follow-up, left-sided weakness. EXAM: CT ANGIOGRAPHY HEAD AND NECK CT HEAD WITHOUT CONTRAST TECHNIQUE: Multidetector CT imaging of the head and neck was performed using the standard protocol during bolus administration of intravenous contrast. Multiplanar CT image reconstructions and MIPs were obtained to evaluate the vascular anatomy. Carotid stenosis measurements (when applicable) are obtained utilizing NASCET criteria, using the distal internal carotid diameter as the denominator. CONTRAST:  50mL ISOVUE-370 IOPAMIDOL (ISOVUE-370) INJECTION 76% COMPARISON:  02/13/2018 CT head. FINDINGS: CT HEAD FINDINGS Brain: Stable acute hemorrhage within the right basal ganglia measuring 3.7 x 2.0 cm (series 14, image 13). Stable surrounding edema and associated mass effect with partial effacement of right lateral ventricle and 4 mm of right-to-left midline shift. No new acute intracranial hemorrhage, large acute stroke, or focal mass effect of the brain. No extra-axial collection, effacement of basilar cisterns, or herniation. Vascular: As below. Skull: Normal. Negative for fracture or focal lesion. Sinuses: Imaged portions are clear. Orbits: No acute  finding. Review of the MIP images confirms the above findings CTA NECK FINDINGS Aortic arch: Standard branching. Imaged portion shows no evidence of aneurysm or dissection. No significant stenosis of the major arch vessel origins. Right carotid system: No evidence of dissection, stenosis (50% or greater) or occlusion. Left carotid system: No evidence of dissection, stenosis (50% or greater) or occlusion. Vertebral arteries: Left dominant. No evidence of dissection, stenosis (50% or greater) or occlusion. Skeleton: Mild cervical spondylosis. No high-grade bony canal stenosis. Other neck: Negative. Upper chest: Negative. Review of the MIP images confirms the above findings CTA HEAD FINDINGS Anterior circulation: No significant stenosis, proximal occlusion, aneurysm, or vascular malformation. Posterior circulation: No significant stenosis, proximal occlusion, aneurysm, or vascular malformation. Venous sinuses: As permitted by contrast timing, patent. Anatomic variants: Right fetal PCA. Right vertebral artery ends in the right PICA. Delayed phase: No abnormal intracranial enhancement. Review of the MIP images confirms the above findings IMPRESSION: 1. Patent  carotid and vertebral arteries. No dissection, aneurysm, or hemodynamically significant stenosis utilizing NASCET criteria. 2. Patent anterior and posterior intracranial circulation. No large vessel occlusion, aneurysm, or significant stenosis. No vascular malformation. 3. Stable hematoma in right basal ganglia measuring up to 3.7 cm. Stable associated edema and mass effect with 4 mm right-to-left midline shift. No new acute intracranial abnormality identified. Electronically Signed   By: Mitzi HansenLance  Furusawa-Stratton M.D.   On: 02/14/2018 01:13   Ct Angio Neck W Or Wo Contrast  Result Date: 02/14/2018 CLINICAL DATA:  52 y/o F; stroke for follow-up, left-sided weakness. EXAM: CT ANGIOGRAPHY HEAD AND NECK CT HEAD WITHOUT CONTRAST TECHNIQUE: Multidetector CT imaging of  the head and neck was performed using the standard protocol during bolus administration of intravenous contrast. Multiplanar CT image reconstructions and MIPs were obtained to evaluate the vascular anatomy. Carotid stenosis measurements (when applicable) are obtained utilizing NASCET criteria, using the distal internal carotid diameter as the denominator. CONTRAST:  50mL ISOVUE-370 IOPAMIDOL (ISOVUE-370) INJECTION 76% COMPARISON:  02/13/2018 CT head. FINDINGS: CT HEAD FINDINGS Brain: Stable acute hemorrhage within the right basal ganglia measuring 3.7 x 2.0 cm (series 14, image 13). Stable surrounding edema and associated mass effect with partial effacement of right lateral ventricle and 4 mm of right-to-left midline shift. No new acute intracranial hemorrhage, large acute stroke, or focal mass effect of the brain. No extra-axial collection, effacement of basilar cisterns, or herniation. Vascular: As below. Skull: Normal. Negative for fracture or focal lesion. Sinuses: Imaged portions are clear. Orbits: No acute finding. Review of the MIP images confirms the above findings CTA NECK FINDINGS Aortic arch: Standard branching. Imaged portion shows no evidence of aneurysm or dissection. No significant stenosis of the major arch vessel origins. Right carotid system: No evidence of dissection, stenosis (50% or greater) or occlusion. Left carotid system: No evidence of dissection, stenosis (50% or greater) or occlusion. Vertebral arteries: Left dominant. No evidence of dissection, stenosis (50% or greater) or occlusion. Skeleton: Mild cervical spondylosis. No high-grade bony canal stenosis. Other neck: Negative. Upper chest: Negative. Review of the MIP images confirms the above findings CTA HEAD FINDINGS Anterior circulation: No significant stenosis, proximal occlusion, aneurysm, or vascular malformation. Posterior circulation: No significant stenosis, proximal occlusion, aneurysm, or vascular malformation. Venous sinuses:  As permitted by contrast timing, patent. Anatomic variants: Right fetal PCA. Right vertebral artery ends in the right PICA. Delayed phase: No abnormal intracranial enhancement. Review of the MIP images confirms the above findings IMPRESSION: 1. Patent carotid and vertebral arteries. No dissection, aneurysm, or hemodynamically significant stenosis utilizing NASCET criteria. 2. Patent anterior and posterior intracranial circulation. No large vessel occlusion, aneurysm, or significant stenosis. No vascular malformation. 3. Stable hematoma in right basal ganglia measuring up to 3.7 cm. Stable associated edema and mass effect with 4 mm right-to-left midline shift. No new acute intracranial abnormality identified. Electronically Signed   By: Mitzi HansenLance  Furusawa-Stratton M.D.   On: 02/14/2018 01:13   Dg Chest Port 1 View  Result Date: 02/13/2018 CLINICAL DATA:  Intracranial hemorrhage.  Hypertension. EXAM: PORTABLE CHEST 1 VIEW COMPARISON:  November 05, 2015 FINDINGS: Lungs are clear. Heart size and pulmonary vascularity are normal. No adenopathy. No bone lesions. IMPRESSION: No edema or consolidation. Electronically Signed   By: Bretta BangWilliam  Woodruff III M.D.   On: 02/13/2018 11:32   Ct Head Code Stroke Wo Contrast  Result Date: 02/13/2018 CLINICAL DATA:  Code stroke.  Left-sided weakness. EXAM: CT HEAD WITHOUT CONTRAST TECHNIQUE: Contiguous axial images were obtained from the  base of the skull through the vertex without intravenous contrast. COMPARISON:  None. FINDINGS: Brain: An acute parenchymal hemorrhage centered in the lateral aspect of the right lentiform nucleus/external capsule measures 3.9 x 2.0 x 2.7 cm (approximate volume of 11 cc). There is mild surrounding edema with mass effect on the basal ganglia and right lateral ventricle. Leftward midline shift measures 4 mm. There is no ventriculomegaly. No acute large territory infarct is identified separate from the hemorrhage. Cerebral volume is within normal limits  for age. There is no extra-axial fluid collection. Vascular: No hyperdense vessel. Skull: No fracture focal osseous lesion. Sinuses/Orbits: Visualized paranasal sinuses and mastoid air cells are clear. Orbits are unremarkable. Other: None. ASPECTS [facility] Stroke Program Early CT Score) Not scored due to the presence of hemorrhage. 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 Critical Value/emergent results were called by telephone at the time of interpretation on 02/13/2018 at 11:01 am to Dr. Caryl Pina, who verbally acknowledged these results. Electronically Signed   By: Sebastian Ache M.D.   On: 02/13/2018 11:01    2D Echocardiogram  - Left ventricle: The cavity size was normal. Wall thickness wasnormal. Systolic function was normal. The estimated ejectionfraction was in the range of 60% to 65%. Wall motion was normal;there were no regional wall motion abnormalities. Leftventricular diastolic function parameters were normal. - Aortic valve: Sclerosis without stenosis. Transvalvular velocitywas minimally increased. There was no regurgitation. Meangradient (S): 9 mm Hg. Peak gradient (S): 16 mm Hg. - Left atrium: The atrium was normal in size. - Right atrium: The atrium was mildly dilated. - Inferior vena cava: The vessel was normal in size. Therespirophasic diameter changes were in the normal range (>= 50%),consistent with normal central venous pressure. Impressions:  LVEF 60-65%, normal wall thickness, normal wall motion, normaldiastolic function, aortic valve sclerosis, normal LA size, mildRAE, normal IVC.     HISTORY OF PRESENT ILLNESS Jocelyn [name] is a 52 y.o. female with a history of hypertension and anemia who lives alone and apparently was last known normal at approximately 6:30 PM on Sunday evening 02/12/2018.  A friend called her this morning; however, the patient did not answer the phone and her friend came to her house.  When  she arrived she found the patient sitting on the floor next to her bed unable to get up.  EMS was called and the patient was brought to [facility] as a code stroke.  In route EMS noted left sided weakness, left facial droop, and slurred speech. The patient was noted to be oriented.  On arrival to the emergency department the patient's exam was much the same.  She was somewhat drowsy. An NIH Stroke Scale was 9.  A stat CT scan of the head was performed that revealed an intracerebral hemorrhage.  The patient states she was not taking any medications prior to admission. She was admitted to the neuro ICU for further evaluation and treatment.     HOSPITAL COURSE [name] is a 52 y.o. female with history of HTN and anemia admitted for found down, left sided weakness, left facial droop and slurry speech. No tPA given due to ICH.    ICH:  right BG ICH likely due to hypertension   Resultant left hemiplegia, left facial droop  CT head right BG ICH with 4mm midline shift  CTA head and neck - unremarkable  CT head repeat 02/14/18 stable hematoma  Repeat CT head  4/11 stable. No increase in hmg. Mild increase in surrounding edema (expected)  2D Echo  EF 60-65%. No source of embolus   LDL 115  HgbA1c 5.3  No antithrombotic prior to admission, now on No antithrombotic due to ICH  Ongoing aggressive stroke risk factor management  Therapy recommendations:  CIR  Disposition:  CIR  Cerebral edema  CT showed 4mm midline shift  Repeat CT 4/9 stable  Repeat CT head 4/11 stable. No increase in hmg. Mild increase in surrounding edema (expected)  Treated with 3% saline, now off  Na 140  Hypertensive Emergency  BP as high as 172/97  Treated with cleviprex, now off   BP goal < 160  Now on norvasc 10 and lisinopril 20 bid  BP goal normotensive  Hyperlipidemia  Home meds:  none   LDL 115, goal < 70  Add lipitor 20 mg daily at discharge  Anemia   Found in  07/2017 with Hb 6.8 s/p transfusion  Iron pill as home meds  Hb 12.2->9.2->8.3->8.4->8.2-> 8.1 (stable)  Iron panel Fe 9 L, TIBC 350, Sat 3 L, Fer 5 L   Continue iron tabs  Tobacco abuse  Current smoker  Smoking cessation counseling provided  Pt is willing to quit  Other Stroke Risk Factors  UDS positive for amphetamine  Other Active Problems  Hypokalemia 3.3->3.8 - supplemented - Mg normal   DISCHARGE EXAM Blood pressure 134/79, pulse 72, temperature 97.8 F (36.6 C), temperature source Oral, resp. rate 16, height 5\' 7"  (1.702 m), weight 81.2 kg (179 lb 0.2 oz), last menstrual period 01/13/2018, SpO2 98 %. General - Well nourished, well developed, in no apparent distress. Cardiovascular - Regular rate and rhythm with no murmur.  Mental Status -  Level of arousal and orientation to time, place, and person were intact. Language including expression, naming, repetition, comprehension was assessed and found intact.  Cranial Nerves II - XII - II - Visual field intact OU. III, IV, VI - Extraocular movements intact. V - Facial sensation intact bilaterally. VII - left facial droop. VIII - Hearing & vestibular intact bilaterally. X - Palate elevates symmetrically. XI - Chin turning & shoulder shrug intact bilaterally. XII - Tongue protrusion intact.  Motor Strength - The patient's strength was normal in RUE and RLE,  2/5 LUE and 3/5 LLE.  Bulk was normal and fasciculations were absent.   Motor Tone - Muscle tone was assessed at the neck and appendages and was normal.  Sensory - Light touch, temperature/pinprick were assessed and were symmetrical.    Coordination - The patient had normal movements in the right hand with no ataxia or dysmetria.  Tremor was absent.  Plantars:  R downgoing, L upgoing  Gait and Station - deferred.  Discharge Diet  Heart Healthy,  Fluid consistency: Thin liquids  DISCHARGE PLAN  Disposition:  Transfer to [facility]  Rehab for ongoing PT, OT and ST  Recommend ongoing risk factor control by Primary Care Physician at time of discharge from inpatient rehabilitation.  Follow-up Primary Care in 2 weeks following discharge from rehab. Get a PCP if you do not have  Follow-up in [facility] in 4 weeks, office to schedule an appointment.   40 minutes were spent preparing discharge.  Annie Main, MSN, APRN, ANVP-BC, AGPCNP-BC Advanced Practice Stroke Nurse [facility] See Amion for Schedule & Pager information 02/17/2018 3:54 PM   ATTENDING NOTE: I reviewed above note and agree with the assessment and plan.  I have made any additions or clarifications directly to the above note. Pt was seen and examined.   Pt neuro stable, no acute event overnight. No HA, left UE 2+/5 and improved from yesterday, LLE 3+/5. No HA. Sitting in chair, AAOx 3. BP much improved now at goal with amlodipine and lisinopril, goal normotensive. Hb stable on iron pills. will d/c to CIR today. Follow up with [facility] in 4 weeks.  Marvel Plan, MD PhD Stroke Neurology 02/17/2018 11:27 PM

## 2018-02-16 NOTE — Progress Notes (Signed)
Pt off unit down to CT via bed with transporter

## 2018-02-16 NOTE — Plan of Care (Signed)
  Problem: Education: Goal: Knowledge of secondary prevention will improve Outcome: Progressing   Problem: Education: Goal: Knowledge of General Education information will improve Outcome: Progressing   Problem: Health Behavior/Discharge Planning: Goal: Ability to manage health-related needs will improve Outcome: Progressing   Problem: Clinical Measurements: Goal: Ability to maintain clinical measurements within normal limits will improve Outcome: Progressing Goal: Will remain free from infection Outcome: Progressing Goal: Diagnostic test results will improve Outcome: Progressing Goal: Respiratory complications will improve Outcome: Progressing Goal: Cardiovascular complication will be avoided Outcome: Progressing   Problem: Activity: Goal: Risk for activity intolerance will decrease Outcome: Progressing   Problem: Nutrition: Goal: Adequate nutrition will be maintained Outcome: Progressing   Problem: Coping: Goal: Level of anxiety will decrease Outcome: Progressing   Problem: Elimination: Goal: Will not experience complications related to bowel motility Outcome: Progressing Goal: Will not experience complications related to urinary retention Outcome: Progressing   Problem: Pain Managment: Goal: General experience of comfort will improve Outcome: Progressing   Problem: Safety: Goal: Ability to remain free from injury will improve Outcome: Progressing   Problem: Skin Integrity: Goal: Risk for impaired skin integrity will decrease Outcome: Progressing

## 2018-02-16 NOTE — Progress Notes (Signed)
Physical Therapy Treatment Patient Details Name: Jocelyn [name] MRN: [id number] DOB: 1965/12/15 Today's Date: 02/16/2018    History of Present Illness [name] is a 52 y.o. female with history of HTN and anemia admitted for found down, left sided weakness, left facial droop and slurry speech. 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    PT Comments    Patient progressing well towards PT goals. Tolerated gait training with Mod A of 2 for weight shifting, sequencing and supporting left knee due to buckling. Able to initiate step through gait and hip extension during mobility today with cues. Pt with flat affect and minimal verbalizations during session. Follows 2 step commands with increased time. Great CIR candidate.  Will follow and progress as tolerated.    Follow Up Recommendations  CIR     Equipment Recommendations  None recommended by PT    Recommendations for Other Services       Precautions / Restrictions Precautions Precautions: Fall Precaution Comments: watch BP (DBP elevation) Restrictions Weight Bearing Restrictions: No    Mobility  Bed Mobility Overal bed mobility: Needs Assistance Bed Mobility: Supine to Sit     Supine to sit: Mod assist;HOB elevated     General bed mobility comments: Cues to bend right knee onto bed to assist withs cooting bottom, able to elevate trunk but needs assist with LLE to get to EOB.  Transfers Overall transfer level: Needs assistance Equipment used: 1 person hand held assist Transfers: Sit to/from [employer] Sit to Stand: Mod assist Stand pivot transfers: Mod assist;+2 safety/equipment       General transfer comment: Assist to power to standing with cues for anterior weight shift. Assist to support left knee and elevate hips. Stood from [employer], from chair x2. SPT bed to chair with MOd A of 2 to sequence LEs.  Ambulation/Gait Ambulation/Gait assistance: Mod assist;+2 physical  assistance Ambulation Distance (Feet): 14 Feet(x2 bouts) Assistive device: 1 person hand held assist Gait Pattern/deviations: Step-to pattern;Step-through pattern;Decreased stance time - left;Decreased dorsiflexion - left;Narrow base of support Gait velocity: decreased Gait velocity interpretation: Below normal speed for age/gender General Gait Details: Initially step to gait, required cues to step through with therapist supporting left knee during stance phase due to knee instability; cues for upright posture and assist for weightshifting. 1 seated rest break.    Stairs            Wheelchair Mobility    Modified Rankin (Stroke Patients Only) Modified Rankin (Stroke Patients Only) Pre-Morbid Rankin Score: No symptoms Modified Rankin: Moderately severe disability     Balance Overall balance assessment: Needs assistance Sitting-balance support: Feet supported;No upper extremity supported Sitting balance-Leahy Scale: Fair     Standing balance support: During functional activity Standing balance-Leahy Scale: Poor Standing balance comment: Able to stand statically with external support.                             Cognition Arousal/Alertness: Awake/alert Behavior During Therapy: Flat affect Overall Cognitive Status: Within Functional Limits for tasks assessed                                 General Comments: Following simple commands. Requiring increased time for problem solving/answering questions. Decreased verbal responses to questions. Dysarthric.      Exercises      General Comments  Pertinent Vitals/Pain Pain Assessment: No/denies pain    Home Living                      Prior Function            PT Goals (current goals can now be found in the care plan section) Progress towards PT goals: Progressing toward goals    Frequency    Min 4X/week      PT Plan Current plan remains appropriate     Co-evaluation              AM-PAC PT "6 Clicks" Daily Activity  Outcome Measure  Difficulty turning over in bed (including adjusting bedclothes, sheets and blankets)?: Unable Difficulty moving from lying on back to sitting on the side of the bed? : Unable Difficulty sitting down on and standing up from a chair with arms (e.g., wheelchair, bedside commode, etc,.)?: Unable Help needed moving to and from a bed to chair (including a wheelchair)?: A Lot Help needed walking in hospital room?: A Lot Help needed climbing 3-5 steps with a railing? : Total 6 Click Score: 8    End of Session Equipment Utilized During Treatment: Gait belt Activity Tolerance: Patient tolerated treatment well Patient left: in chair;with call bell/phone within reach(no chair alarm pads) Nurse Communication: Mobility status PT Visit Diagnosis: Other abnormalities of gait and mobility (R26.89);Hemiplegia and hemiparesis Hemiplegia - Right/Left: Left Hemiplegia - dominant/non-dominant: Non-dominant Hemiplegia - caused by: Nontraumatic intracerebral hemorrhage     Time: [id number] PT Time Calculation (min) (ACUTE ONLY): 27 min  Charges:  $Gait Training: 8-22 mins $Neuromuscular Re-education: 8-22 mins                    G Codes:       Mylo RedShauna Jareli Highland, PT, DPT [phone]     Blake DivineShauna A Breella Vanostrand 02/16/2018, 5:17 PM

## 2018-02-17 ENCOUNTER — Encounter (HOSPITAL_COMMUNITY): Payer: Self-pay

## 2018-02-17 ENCOUNTER — Inpatient Hospital Stay (HOSPITAL_COMMUNITY)
Admission: RE | Admit: 2018-02-17 | Discharge: 2018-03-09 | DRG: 057 | Disposition: A | Discharge status: Home Health | Payer: 59 | Source: Intra-hospital | Attending: Physical Medicine & Rehabilitation | Admitting: Physical Medicine & Rehabilitation

## 2018-02-17 ENCOUNTER — Other Ambulatory Visit: Payer: Self-pay

## 2018-02-17 DIAGNOSIS — I69192 Facial weakness following nontraumatic intracerebral hemorrhage: Secondary | ICD-10-CM

## 2018-02-17 DIAGNOSIS — E876 Hypokalemia: Secondary | ICD-10-CM | POA: Diagnosis not present

## 2018-02-17 DIAGNOSIS — I69154 Hemiplegia and hemiparesis following nontraumatic intracerebral hemorrhage affecting left non-dominant side: Principal | ICD-10-CM

## 2018-02-17 DIAGNOSIS — I619 Nontraumatic intracerebral hemorrhage, unspecified: Secondary | ICD-10-CM | POA: Diagnosis present

## 2018-02-17 DIAGNOSIS — D62 Acute posthemorrhagic anemia: Secondary | ICD-10-CM

## 2018-02-17 DIAGNOSIS — I69119 Unspecified symptoms and signs involving cognitive functions following nontraumatic intracerebral hemorrhage: Secondary | ICD-10-CM | POA: Diagnosis not present

## 2018-02-17 DIAGNOSIS — G8320 Monoplegia of upper limb affecting unspecified side: Secondary | ICD-10-CM | POA: Diagnosis not present

## 2018-02-17 DIAGNOSIS — K5901 Slow transit constipation: Secondary | ICD-10-CM | POA: Diagnosis not present

## 2018-02-17 DIAGNOSIS — I610 Nontraumatic intracerebral hemorrhage in hemisphere, subcortical: Secondary | ICD-10-CM | POA: Diagnosis not present

## 2018-02-17 DIAGNOSIS — I10 Essential (primary) hypertension: Secondary | ICD-10-CM | POA: Diagnosis present

## 2018-02-17 DIAGNOSIS — I69120 Aphasia following nontraumatic intracerebral hemorrhage: Secondary | ICD-10-CM

## 2018-02-17 DIAGNOSIS — I69252 Hemiplegia and hemiparesis following other nontraumatic intracranial hemorrhage affecting left dominant side: Secondary | ICD-10-CM | POA: Diagnosis not present

## 2018-02-17 DIAGNOSIS — M7062 Trochanteric bursitis, left hip: Secondary | ICD-10-CM | POA: Diagnosis present

## 2018-02-17 DIAGNOSIS — E785 Hyperlipidemia, unspecified: Secondary | ICD-10-CM

## 2018-02-17 DIAGNOSIS — D638 Anemia in other chronic diseases classified elsewhere: Secondary | ICD-10-CM | POA: Diagnosis not present

## 2018-02-17 DIAGNOSIS — F17210 Nicotine dependence, cigarettes, uncomplicated: Secondary | ICD-10-CM | POA: Diagnosis present

## 2018-02-17 DIAGNOSIS — I69354 Hemiplegia and hemiparesis following cerebral infarction affecting left non-dominant side: Secondary | ICD-10-CM | POA: Diagnosis present

## 2018-02-17 DIAGNOSIS — G936 Cerebral edema: Secondary | ICD-10-CM

## 2018-02-17 LAB — BASIC METABOLIC PANEL
ANION GAP: 10 (ref 5–15)
BUN: 10 mg/dL (ref 6–20)
CALCIUM: 8.8 mg/dL — AB (ref 8.9–10.3)
CO2: 24 mmol/L (ref 22–32)
Chloride: 106 mmol/L (ref 101–111)
Creatinine, Ser: 0.8 mg/dL (ref 0.44–1.00)
Glucose, Bld: 74 mg/dL (ref 65–99)
Potassium: 3.8 mmol/L (ref 3.5–5.1)
Sodium: 140 mmol/L (ref 135–145)

## 2018-02-17 LAB — CBC
HEMATOCRIT: 29.7 % — AB (ref 36.0–46.0)
Hemoglobin: 8.1 g/dL — ABNORMAL LOW (ref 12.0–15.0)
MCH: 17.1 pg — ABNORMAL LOW (ref 26.0–34.0)
MCHC: 27.3 g/dL — ABNORMAL LOW (ref 30.0–36.0)
MCV: 62.7 fL — ABNORMAL LOW (ref 78.0–100.0)
PLATELETS: 416 10*3/uL — AB (ref 150–400)
RBC: 4.74 MIL/uL (ref 3.87–5.11)
RDW: 21.6 % — AB (ref 11.5–15.5)
WBC: 8.2 10*3/uL (ref 4.0–10.5)

## 2018-02-17 LAB — GLUCOSE, CAPILLARY: Glucose-Capillary: 96 mg/dL (ref 65–99)

## 2018-02-17 MED ORDER — PROCHLORPERAZINE 25 MG RE SUPP: 12.5000 mg | Freq: Four times a day (QID) | RECTAL | Status: DC | PRN

## 2018-02-17 MED ORDER — LISINOPRIL 20 MG PO TABS: 20.0000 mg | ORAL_TABLET | Freq: Two times a day (BID) | ORAL | Status: DC

## 2018-02-17 MED ORDER — BISACODYL 10 MG RE SUPP: 10.0000 mg | Freq: Every day | RECTAL | Status: DC | PRN

## 2018-02-17 MED ORDER — DIPHENHYDRAMINE HCL 12.5 MG/5ML PO ELIX: 12.5000 mg | ORAL_SOLUTION | Freq: Four times a day (QID) | ORAL | Status: DC | PRN

## 2018-02-17 MED ORDER — TRAZODONE HCL 50 MG PO TABS
25.0000 mg | ORAL_TABLET | Freq: Every evening | ORAL | Status: DC | PRN
  Administered 2018-03-05 – 2018-03-08 (×4): 50 mg via ORAL
  Filled 2018-02-17 (×4): qty 1

## 2018-02-17 MED ORDER — LISINOPRIL 20 MG PO TABS
20.0000 mg | ORAL_TABLET | Freq: Two times a day (BID) | ORAL | Status: DC
  Administered 2018-02-17 – 2018-03-09 (×40): 20 mg via ORAL
  Filled 2018-02-17 (×40): qty 1

## 2018-02-17 MED ORDER — CYANOCOBALAMIN 1000 MCG PO TABS
1000.0000 ug | ORAL_TABLET | Freq: Every day | ORAL | Status: DC
Start: 2018-02-18 — End: 2018-03-09

## 2018-02-17 MED ORDER — SENNOSIDES-DOCUSATE SODIUM 8.6-50 MG PO TABS: 1.0000 | ORAL_TABLET | Freq: Two times a day (BID) | ORAL | Status: DC

## 2018-02-17 MED ORDER — FLEET ENEMA 7-19 GM/118ML RE ENEM: 1.0000 | ENEMA | Freq: Once | RECTAL | Status: DC | PRN

## 2018-02-17 MED ORDER — AMLODIPINE BESYLATE 10 MG PO TABS
10.0000 mg | ORAL_TABLET | Freq: Every day | ORAL | Status: DC
  Administered 2018-02-18 – 2018-03-09 (×20): 10 mg via ORAL
  Filled 2018-02-17 (×20): qty 1

## 2018-02-17 MED ORDER — AMLODIPINE BESYLATE 10 MG PO TABS: 10.0000 mg | ORAL_TABLET | Freq: Every day | ORAL | Status: DC

## 2018-02-17 MED ORDER — PROCHLORPERAZINE MALEATE 5 MG PO TABS: 5.0000 mg | ORAL_TABLET | Freq: Four times a day (QID) | ORAL | Status: DC | PRN

## 2018-02-17 MED ORDER — POLYETHYLENE GLYCOL 3350 17 G PO PACK
17.0000 g | PACK | Freq: Every day | ORAL | Status: DC | PRN
  Administered 2018-03-02 – 2018-03-09 (×4): 17 g via ORAL
  Filled 2018-02-17 (×4): qty 1

## 2018-02-17 MED ORDER — PROCHLORPERAZINE EDISYLATE 5 MG/ML IJ SOLN: 5.0000 mg | Freq: Four times a day (QID) | INTRAMUSCULAR | Status: DC | PRN

## 2018-02-17 MED ORDER — SENNOSIDES-DOCUSATE SODIUM 8.6-50 MG PO TABS
1.0000 | ORAL_TABLET | Freq: Two times a day (BID) | ORAL | Status: DC
  Administered 2018-02-17 – 2018-03-09 (×16): 1 via ORAL
  Filled 2018-02-17 (×38): qty 1

## 2018-02-17 MED ORDER — VITAMIN B-12 1000 MCG PO TABS
1000.0000 ug | ORAL_TABLET | Freq: Every day | ORAL | Status: DC
  Administered 2018-02-18 – 2018-03-09 (×20): 1000 ug via ORAL
  Filled 2018-02-17 (×20): qty 1

## 2018-02-17 MED ORDER — FERROUS SULFATE 325 (65 FE) MG PO TABS
325.0000 mg | ORAL_TABLET | Freq: Three times a day (TID) | ORAL | Status: DC
  Administered 2018-02-18 – 2018-03-09 (×57): 325 mg via ORAL
  Filled 2018-02-17 (×59): qty 1

## 2018-02-17 MED ORDER — ALUM & MAG HYDROXIDE-SIMETH 200-200-20 MG/5ML PO SUSP: 30.0000 mL | ORAL | Status: DC | PRN

## 2018-02-17 MED ORDER — GUAIFENESIN-DM 100-10 MG/5ML PO SYRP: 5.0000 mL | ORAL_SOLUTION | Freq: Four times a day (QID) | ORAL | Status: DC | PRN

## 2018-02-17 MED ORDER — FERROUS SULFATE 325 (65 FE) MG PO TABS: 325.0000 mg | ORAL_TABLET | Freq: Three times a day (TID) | ORAL | 3 refills | Status: DC

## 2018-02-17 MED ORDER — ACETAMINOPHEN 325 MG PO TABS
325.0000 mg | ORAL_TABLET | ORAL | Status: DC | PRN
  Administered 2018-02-19 – 2018-02-26 (×3): 650 mg via ORAL
  Filled 2018-02-17 (×5): qty 2

## 2018-02-17 NOTE — Progress Notes (Signed)
Pt discharge education and instructions completed. Pt discharge to CIR and report called off to nurse on the unit. Pt telemetry removed; IV saline locked to bilateral forearms. Pt discharge to CIR and pt transported off unit via bed with belongings and family to the side. Dionne BucyP. Amo Sekai Nayak RN

## 2018-02-17 NOTE — PMR Pre-admission (Addendum)
PMR Admission Coordinator Pre-Admission Assessment  Patient: Jocelyn [name] is an 52 y.o., female MRN: [id number] DOB: 1965-12-19 Height: 5\' 7"  (170.2 cm) Weight: 81.2 kg (179 lb 0.2 oz)              Insurance Information HMO:     PPO:      PCP:      IPA:      80/20:      OTHER: [employer] PRIMARY: [employer]       Policy#: [id number]      Subscriber: Self CM Name: Rebeca Alert      Phone#: [phone]     Fax#: [phone] Pre-Cert#: [id number] for 7 days (02/17/18-02/23/18) to admit with updated clinicals to be requested by CM via fax     Employer: Full Time Benefits:  Phone #: [phone]     Name: Verified online at [facility]com Eff. Date: 11/08/17     Deduct: $1000      Out of Pocket Max: $5000      Life Max: N/A CIR: 80%/20%      SNF: 80%/20% Outpatient: 100 visits combined with pre-auth     Co-Pay: $30 per visit  Home Health: 80%      Co-Pay: 20% DME: 80%     Co-Pay: 20% Providers: In-network   SECONDARY: None        Medicaid Application Date:       Case Manager:  Disability Application Date:       Case Worker:   Emergency Contact Information Contact Information    Name Relation Home Work Mobile   [place] Spouse   [phone]   Driscilla Grammes Sister   [phone]   [facility] Relative [phone]       Current Medical History  Patient Admitting Diagnosis: Right basal ganglia hemorrhage  History of Present Illness: Jocelyn [name] is a 52 y.o. right-handed female with history of hypertension and anemia as well as tobacco abuse.  Per chart review and family, patient lives alone currently separated.  One level apartment.  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  Her husband can assist but works during the day.  Presented 4/82019 with left-sided weakness and lethargy.  Blood pressure 148/101.  UDS positive amphetamines.  Cranial CT scan reviewed, showing right basal ganglia hemorrhage. Per report, acute right basal ganglia  hemorrhage with mild edema and 4 mm left midline shift.  CT Angio of head and neck with no dissection or aneurysm.  Echocardiogram with ejection fraction of 65% no wall motion abnormalities.  Treated with 3% saline for cerebral edema.  Close monitoring of blood pressure.  Physical therapy evaluation completed with recommendations of physical medicine rehab consult and patient admitted 02/17/18.  NIH Total: 7    Past Medical History  Past Medical History:  Diagnosis Date  . Anemia   . Hypertension   . ICH (intracerebral hemorrhage) (HCC) 02/2018    Family History  family history is not on file.  Prior Rehab/Hospitalizations:  Has the patient had major surgery during 100 days prior to admission? No  Current Medications   Current Facility-Administered Medications:  .   stroke: mapping our early stages of recovery book, , Does not apply, Once, Annie Main L, NP .  acetaminophen (TYLENOL) tablet 650 mg, 650 mg, Oral, Q4H PRN **OR** acetaminophen (TYLENOL) solution 650 mg, 650 mg, Per Tube, Q4H PRN **OR** acetaminophen (TYLENOL) suppository 650 mg, 650 mg, Rectal, Q4H PRN, Catha Gosselin, Sharon L, NP .  amLODipine (  NORVASC) tablet 10 mg, 10 mg, Oral, Daily, Marvel Plan, MD, 10 mg at 02/17/18 1010 .  ferrous sulfate tablet 325 mg, 325 mg, Oral, TID WC, Biby, Sharon L, NP, 325 mg at 02/17/18 1327 .  iopamidol (ISOVUE-370) 76 % injection 50 mL, 50 mL, Intravenous, Once PRN, Biby, Sharon L, NP .  labetalol (NORMODYNE,TRANDATE) injection 10 mg, 10 mg, Intravenous, Once, Layne Benton, NP, Stopped at 02/13/18 1526 .  lisinopril (PRINIVIL,ZESTRIL) tablet 20 mg, 20 mg, Oral, BID, Marvel Plan, MD, 20 mg at 02/17/18 1010 .  senna-docusate (Senokot-S) tablet 1 tablet, 1 tablet, Oral, BID, Biby, Sharon L, NP, 1 tablet at 02/17/18 1010 .  vitamin B-12 (CYANOCOBALAMIN) tablet 1,000 mcg, 1,000 mcg, Oral, Daily, Biby, Sharon L, NP, 1,000 mcg at 02/17/18 1010  Patients Current Diet: Fall precautions Diet Heart  Room service appropriate? Yes; Fluid consistency: Thin  Precautions / Restrictions Precautions Precautions: Fall Precaution Comments: watch BP (DBP elevation) Restrictions Weight Bearing Restrictions: No   Has the patient had 2 or more falls or a fall with injury in the past year?No  Prior Activity Level Community (5-7x/wk): Prior to admission patient was fully independent, driving, and working.    Home Assistive Devices / Equipment Home Assistive Devices/Equipment: None Home Equipment: None  Prior Device Use: Indicate devices/aids used by the patient prior to current illness, exacerbation or injury? None of the above  Prior Functional Level Prior Function Level of Independence: Independent Comments: ADLs, IADLs, driving, and working a desk job  Self Care: Did the patient need help bathing, dressing, using the toilet or eating? Independent  Indoor Mobility: Did the patient need assistance with walking from room to room (with or without device)? Independent  Stairs: Did the patient need assistance with internal or external stairs (with or without device)? Independent  Functional Cognition: Did the patient need help planning regular tasks such as shopping or remembering to take medications? Independent  Current Functional Level Cognition  Arousal/Alertness: Awake/alert Overall Cognitive Status: Within Functional Limits for tasks assessed Orientation Level: Oriented X4 General Comments: answering questions and talking during session.  received flowers during session and was able to open the card with therapist asst. helping with compensatory tech. and then read the card Attention: Alternating Alternating Attention: Appears intact Memory: Appears intact Awareness: Appears intact Problem Solving: Appears intact    Extremity Assessment (includes Sensation/Coordination)  Upper Extremity Assessment: LUE deficits/detail LUE Deficits / Details: Pt with slight grasp and no active  movement at wrist, elbow, and shoulder. Brunnstrom stage 1. No withdrawl to pain.  LUE Coordination: decreased fine motor, decreased gross motor  Lower Extremity Assessment: Defer to PT evaluation LLE Deficits / Details: no active movement seen in ankle, positive knee extension about 2+/5, hip flexion about 2-/5, reports intact sensation, but question proprioception    ADLs  Overall ADL's : Needs assistance/impaired Eating/Feeding: Cueing for sequencing, Cueing for compensatory techinques, Bed level Eating/Feeding Details (indicate cue type and reason): reviewed use of RUE for stabalization and integration of use during self feeding.  able to partially grasp sandwich in R Hand and L And therapist asst., assisted bringing r ue up with left to take a bite. also reviewed same tech. for bringing beverage cup to mouth.   Grooming: Moderate assistance, Oral care, Sitting Grooming Details (indicate cue type and reason): Sitting in recliner, Pt requiring Mod A for bilateral tasks. Requiring hand over hand to hold tooth paste in left hand while right hand twist cap. Requiring Min VCs for problem  solving Upper Body Bathing: Maximal assistance, Sitting Lower Body Bathing: Maximal assistance, +2 for physical assistance, Sit to/from stand Upper Body Dressing : Maximal assistance, Sitting Lower Body Dressing: Maximal assistance, Sit to/from stand Toilet Transfer: +2 for physical assistance, Ambulation, Moderate assistance(Simulated to recliner) Functional mobility during ADLs: +2 for physical assistance, Cueing for sequencing, Moderate assistance General ADL Comments: reviewed other ways to integrate use of LUE/hand into adls.  discussed using for stabalization and also bringing into functional use.  husband present and active in learning the techniques.      Mobility  Overal bed mobility: Needs Assistance Bed Mobility: Supine to Sit Supine to sit: Mod assist, HOB elevated General bed mobility comments:  Cues to bend right knee onto bed to assist withs cooting bottom, able to elevate trunk but needs assist with LLE to get to EOB.    Transfers  Overall transfer level: Needs assistance Equipment used: 1 person hand held assist Transfers: Sit to/from Stand, Stand Pivot Transfers Sit to Stand: Mod assist Stand pivot transfers: Mod assist, +2 safety/equipment General transfer comment: Assist to power to standing with cues for anterior weight shift. Assist to support left knee and elevate hips. Stood from [employer], from chair x2. SPT bed to chair with MOd A of 2 to sequence LEs.    Ambulation / Gait / Stairs / Wheelchair Mobility  Ambulation/Gait Ambulation/Gait assistance: Mod assist, +2 physical assistance Ambulation Distance (Feet): 14 Feet(x2 bouts) Assistive device: 1 person hand held assist Gait Pattern/deviations: Step-to pattern, Step-through pattern, Decreased stance time - left, Decreased dorsiflexion - left, Narrow base of support General Gait Details: Initially step to gait, required cues to step through with therapist supporting left knee during stance phase due to knee instability; cues for upright posture and assist for weightshifting. 1 seated rest break.  Gait velocity: decreased Gait velocity interpretation: Below normal speed for age/gender    Posture / Balance Dynamic Sitting Balance Sitting balance - Comments: initially at EOB leaning posteriorly, once cues for anterior weight shift able to remain with UE support and turns head without LOB Balance Overall balance assessment: Needs assistance Sitting-balance support: Feet supported, No upper extremity supported Sitting balance-Leahy Scale: Fair Sitting balance - Comments: initially at EOB leaning posteriorly, once cues for anterior weight shift able to remain with UE support and turns head without LOB Postural control: Posterior lean Standing balance support: During functional activity Standing balance-Leahy Scale:  Poor Standing balance comment: Able to stand statically with external support.     Special needs/care consideration BiPAP/CPAP: No CPM: No Continuous Drip IV: No Dialysis: No         Life Vest: No Oxygen: No Special Bed: No Trach Size: No Wound Vac (area): No       Skin: WDL                               Bowel mgmt: Continent, last BM 02/16/18 Bladder mgmt: Continent but noted to be malodorous in acute chart  Diabetic mgmt: No, HgbA1c 5.3     Previous Home Environment Living Arrangements: Alone Available Help at Discharge: Family, Available 24 hours/day Type of Home: Apartment Home Layout: One level Home Access: Level entry Bathroom Shower/Tub: Engineer, manufacturing systems: Standard Home Care Services: No 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, Government social research officer for Discharge Living Setting: Apartment Type of Home at Discharge: Apartment Discharge Home Layout: One level Discharge Home Access: Level  entry Discharge Bathroom Shower/Tub: Tub/shower unit Discharge Bathroom Toilet: Standard Discharge Bathroom Accessibility: Yes How Accessible: Accessible via walker Does the patient have any problems obtaining your medications?: No  Social/Family/Support Systems Patient Roles: Spouse, Other (Comment)(Friend) Contact Information: Spouse: Ronnell Zaldivar Anticipated Caregiver: Spouse to take FMLA or friend and sister Anticipated Caregiver's Contact Information: see above  Ability/Limitations of Caregiver: Spouse works 7-3, Mon-Fri Caregiver Availability: Other (Comment)(aware of recommendation to put together 24/7) Discharge Plan Discussed with Primary Caregiver: Yes Is Caregiver In Agreement with Plan?: Yes Does Caregiver/Family have Issues with Lodging/Transportation while Pt is in Rehab?: No  Goals/Additional Needs Patient/Family Goal for Rehab: PT/OT: Supervision-Min A; SLP: Supervision  Expected length of stay: 16-19 days  Cultural  Considerations: None Dietary Needs: Heart healthy diet restrictions Equipment Needs: TBD Additional Information: Spouse to take FMLA or have friend, sister to assist Pt/Family Agrees to Admission and willing to participate: Yes Program Orientation Provided & Reviewed with Pt/Caregiver Including Roles  & Responsibilities: Yes Additional Information Needs: Pt. and spouse aware of recommendation for 24/7 and working on a plan Information Needs to be Provided By: Team FYI  Barriers to Discharge: Decreased caregiver support  Decrease burden of Care through IP rehab admission: No  Possible need for SNF placement upon discharge: No  Patient Condition: This patient's condition remains as documented in the consult dated 02/15/18 at 6:06 pm, in which the Rehabilitation Physician determined and documented that the patient's condition is appropriate for intensive rehabilitative care in an inpatient rehabilitation facility. Will admit to inpatient rehab today.  Preadmission Screen Completed By:  Fae PippinMelissa Bowie, 02/17/2018 2:07 PM ______________________________________________________________________   Discussed status with Dr. Allena KatzPatel on 02/17/18 at 1525 and received telephone approval for admission today.  Admission Coordinator:  Fae PippinMelissa Bowie, time 1525/Date 02/17/18

## 2018-02-17 NOTE — Progress Notes (Signed)
Inpatient Rehabilitation  I have received insurance authorization for an IP Rehab admission and discussed our program and expected outcomes with patient and spouse.  I have received medical clearance and plan to proceed with admission today.  Call if questions.   Charlane FerrettiMelissa Mohini Heathcock, M.A., CCC/SLP Admission Coordinator  [facility]  Cell [phone]

## 2018-02-17 NOTE — Progress Notes (Signed)
CSW called to patient's room yesterday afternoon per patient request to receive paperwork for patient's disability claim. Per patient and family at bedside, they attempted to send the paperwork to the neurology office but were unable to complete it because she isn't a patient there yet. Patient will need paperwork filled out by attending.  CSW provided paperwork to neurology NP Jasmine DecemberSharon today to complete and give back to the family when done.  Blenda NicelyElizabeth Pammy Vesey, Kentucky[place]CS[place] Clinical Social Worker [phone]

## 2018-02-17 NOTE — Progress Notes (Signed)
Occupational Therapy Treatment Patient Details Name: NIKKIA DEVOSS MRN: [id number] DOB: 10-09-1966 Today's Date: 02/17/2018    History of present illness Ms. CIPRIANA BILLER is a 52 y.o. female with history of HTN and anemia admitted for found down, left sided weakness, left facial droop and slurry speech. 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   OT comments  Demonstration and education provided for compensatory techniques for integrating use of LUE/hand into functional tasks during eating and adls.  Pt. Receptive, husband also present and verbalized understanding of continued use of RUE during tasks.  Remains excellent CIR candidate for further therapies.   Follow Up Recommendations  CIR;Supervision/Assistance - 24 hour    Equipment Recommendations       Recommendations for Other Services PT consult;Rehab consult;Speech consult    Precautions / Restrictions Precautions Precautions: Fall Precaution Comments: watch BP (DBP elevation) Restrictions Weight Bearing Restrictions: No       Mobility Bed Mobility                  Transfers                      Balance                                           ADL either performed or assessed with clinical judgement   ADL Overall ADL's : Needs assistance/impaired Eating/Feeding: Cueing for sequencing;Cueing for compensatory techinques;Bed level Eating/Feeding Details (indicate cue type and reason): reviewed use of RUE for stabalization and integration of use during self feeding.  able to partially grasp sandwich in R Hand and L And therapist asst., assisted bringing r ue up with left to take a bite. also reviewed same tech. for bringing beverage cup to mouth.                                     General ADL Comments: reviewed other ways to integrate use of LUE/hand into adls.  discussed using for stabalization and also bringing into functional use.  husband present and  active in learning the techniques.       Vision       Perception     Praxis      Cognition Arousal/Alertness: Awake/alert Behavior During Therapy: WFL for tasks assessed/performed Overall Cognitive Status: Within Functional Limits for tasks assessed                                 General Comments: answering questions and talking during session.  received flowers during session and was able to open the card with therapist asst. helping with compensatory tech. and then read the card        Exercises     Shoulder Instructions       General Comments      Pertinent Vitals/ Pain       Pain Assessment: No/denies pain  Home Living                                          Prior Functioning/Environment  Frequency  Min 2X/week        Progress Toward Goals  OT Goals(current goals can now be found in the care plan section)  Progress towards OT goals: Progressing toward goals     Plan      Co-evaluation                 AM-PAC PT "6 Clicks" Daily Activity     Outcome Measure   Help from another person eating meals?: A Lot Help from another person taking care of personal grooming?: A Lot Help from another person toileting, which includes using toliet, bedpan, or urinal?: A Lot Help from another person bathing (including washing, rinsing, drying)?: A Lot Help from another person to put on and taking off regular upper body clothing?: A Lot Help from another person to put on and taking off regular lower body clothing?: Total 6 Click Score: 11    End of Session    OT Visit Diagnosis: Unsteadiness on feet (R26.81);Other abnormalities of gait and mobility (R26.89);Muscle weakness (generalized) (M62.81);Hemiplegia and hemiparesis;Other symptoms and signs involving cognitive function Hemiplegia - Right/Left: Left Hemiplegia - dominant/non-dominant: Non-Dominant Hemiplegia - caused by: Cerebral infarction    Activity Tolerance Patient tolerated treatment well   Patient Left in bed;with call bell/phone within reach;with family/visitor present   Nurse Communication          Time: [id number] OT Time Calculation (min): 13 min  Charges: OT General Charges $OT Visit: 1 Visit OT Treatments $Self Care/Home Management : 8-22 mins   Robet LeuMorris, Mafalda Mcginniss Lorraine, COTA/L 02/17/2018, 1:18 PM

## 2018-02-17 NOTE — Progress Notes (Signed)
Physical Therapy Treatment Patient Details Name: Jocelyn [name] MRN: [id number] DOB: 1966/01/03 Today's Date: 02/17/2018    History of Present Illness Jocelyn [name] is a 52 y.o. female with history of HTN and anemia admitted for found down, left sided weakness, left facial droop and slurry speech. 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    PT Comments    Patient progressing with mobility/ambulatiojn and standing for hygiene.  Noted some soreness L ankle and watched to make sure no ankle torsion or mal placement.  Continues to need skilled PT in the acute setting to allow return home with family support after CIR level rehab stay.   Follow Up Recommendations  CIR     Equipment Recommendations  None recommended by PT    Recommendations for Other Services       Precautions / Restrictions Precautions Precautions: Fall Precaution Comments: watch BP (DBP elevation)    Mobility  Bed Mobility Overal bed mobility: Needs Assistance Bed Mobility: Supine to Sit     Supine to sit: HOB elevated;Mod assist     General bed mobility comments: assist to lift trunk and scoot to EOB  Transfers Overall transfer level: Needs assistance Equipment used: Rolling walker (2 wheeled);2 person hand held assist Transfers: Sit to/from Stand Sit to Stand: Mod assist         General transfer comment: multiple sit to stands for hygiene/bathing and donning mesh breifs, mod cues to push through both LE's and facilitation through L LE   Ambulation/Gait Ambulation/Gait assistance: Mod assist;+2 physical assistance Ambulation Distance (Feet): 14 Feet Assistive device: 2 person hand held assist Gait Pattern/deviations: Step-to pattern;Step-through pattern;Decreased stance time - left;Decreased dorsiflexion - left;Narrow base of support;Shuffle     General Gait Details: step to pattern with spouse assisting on R and PT on L with assist for L LE stance stability.    Stairs              Wheelchair Mobility    Modified Rankin (Stroke Patients Only) Modified Rankin (Stroke Patients Only) Pre-Morbid Rankin Score: No symptoms Modified Rankin: Moderately severe disability     Balance Overall balance assessment: Needs assistance Sitting-balance support: Feet supported;No upper extremity supported Sitting balance-Leahy Scale: Good Sitting balance - Comments: EOB while washing up, pt able to wash front of upper body and tops of legs, no LOB   Standing balance support: During functional activity Standing balance-Leahy Scale: Poor Standing balance comment: walker for support R UE while standing for washing her bottom and donning briefs                            Cognition Arousal/Alertness: Awake/alert Behavior During Therapy: WFL for tasks assessed/performed Overall Cognitive Status: Within Functional Limits for tasks assessed                                        Exercises      General Comments General comments (skin integrity, edema, etc.): spouse in room and assisting when asked; noted bleeding when standing, pt reports started her period      Pertinent Vitals/Pain Pain Assessment: Faces Faces Pain Scale: Hurts a little bit Pain Location: L ankle anteriorly  Pain Descriptors / Indicators: Sore Pain Intervention(s): Monitored during session;Repositioned    Home Living  Prior Function Level of Independence: Independent      Comments: ADLs, IADLs, driving, and working a desk job   PT Goals (current goals can now be found in the care plan section) Progress towards PT goals: Progressing toward goals    Frequency    Min 4X/week      PT Plan Current plan remains appropriate    Co-evaluation              AM-PAC PT "6 Clicks" Daily Activity  Outcome Measure  Difficulty turning over in bed (including adjusting bedclothes, sheets and blankets)?: Unable Difficulty moving  from lying on back to sitting on the side of the bed? : Unable Difficulty sitting down on and standing up from a chair with arms (e.g., wheelchair, bedside commode, etc,.)?: Unable Help needed moving to and from a bed to chair (including a wheelchair)?: A Lot Help needed walking in hospital room?: A Lot Help needed climbing 3-5 steps with a railing? : Total 6 Click Score: 8    End of Session Equipment Utilized During Treatment: Gait belt Activity Tolerance: Patient tolerated treatment well Patient left: with call bell/phone within reach;in chair;with family/visitor present   PT Visit Diagnosis: Other abnormalities of gait and mobility (R26.89);Hemiplegia and hemiparesis Hemiplegia - Right/Left: Left Hemiplegia - dominant/non-dominant: Non-dominant Hemiplegia - caused by: Nontraumatic intracerebral hemorrhage     Time: [id number] PT Time Calculation (min) (ACUTE ONLY): 37 min  Charges:  $Gait Training: 8-22 mins $Therapeutic Activity: 8-22 mins                    G CodesSheran Lawless:       Cyndi Manie Bealer, [place] CarolinaPT [phone] 02/17/2018    Elray Mcgregorynthia Norely Schlick 02/17/2018, 5:25 PM

## 2018-02-17 NOTE — H&P (Addendum)
Physical Medicine and Rehabilitation Admission H&P    Chief Complaint  Patient presents with  . Functional deficits due to stroke.     HPI:  Jocelyn [name] is a 52 year old female with history of untreated hypertension, tobacco use and anemia who was admitted on 02/13/2018 after found by family with left-sided weakness left facial droop and slurred speech. Last seen normal night before.   History taken from chart review and patient. CT of head done showed right basal ganglia hemorrhage with mild edema and leftward shift. Blood pressure 148/101 and she was started on clevidipine as well as hypertonic saline for edema control. CTA head/neck done revealing patent carotid and vertebral arteries, no large vessel occlusion, aneurysm, AVM  or significant stenosis and stable bleed.  MBS done and patient started on regular diet. Blood pressures continue to be labile and medication have been titrated for better control. She has been weaned off hypertonic saline and medically stable. Patient with resultant receptive aphasia, left sided weakness and cognitive deficits. Therapy ongoing and CIR recommended due to functional deficits.    Review of Systems  Constitutional: Negative for chills and fever.  HENT: Negative for hearing loss and tinnitus.   Eyes: Negative for blurred vision and double vision.  Respiratory: Negative for cough and shortness of breath.   Cardiovascular: Negative for chest pain and leg swelling.  Gastrointestinal: Negative for constipation, heartburn and nausea.  Musculoskeletal: Negative for back pain and myalgias.  Skin: Negative for rash.  Neurological: Positive for sensory change, speech change and focal weakness.  Psychiatric/Behavioral: Negative for memory loss.  All other systems reviewed and are negative.      Past Medical History:  Diagnosis Date  . Anemia   . Hypertension   . ICH (intracerebral hemorrhage) ([place]) 02/2018   Past Surgical History:  Procedure  Laterality Date  . MYOMECTOMY      Family History  Problem Relation Age of Onset  . Diabetes Mellitus II Neg Hx   . Hypertension Neg Hx     Social History: Married. Independent and works in [place]. She reports that she has been smoking cigarettes--one pack every two days.  She has a 2.00 pack-year smoking history. She has never used smokeless tobacco. She reports that she drinks alcohol one to two x week. She reports that she does not use drugs.    Allergies: No Known Allergies    Medications Prior to Admission  Medication Sig Dispense Refill  . acetaminophen (TYLENOL) 500 MG tablet Take 500 mg by mouth every 6 (six) hours as needed for headache (pain).    . ferrous sulfate 325 (65 FE) MG tablet Take 1 tablet (325 mg total) by mouth 2 (two) times daily with a meal. (Patient not taking: Reported on 07/21/2017) 60 tablet 3  . hydrochlorothiazide (MICROZIDE) 12.5 MG capsule Take 1 capsule (12.5 mg total) by mouth daily. (Patient not taking: Reported on 07/21/2017) 30 capsule 0    Drug Regimen Review  Drug regimen was reviewed and remains appropriate with no significant issues identified  Home: Home Living Family/patient expects to be discharged to:: Private residence Living Arrangements: Alone Available Help at Discharge: Family, Available 24 hours/day Type of Home: Apartment Home Access: Level entry Home Layout: One level Bathroom Shower/Tub: Chiropodist: Standard Home Equipment: None Additional Comments: worked Network engineer job, Building surveyor History: Prior Function Level of Independence: Independent Comments: ADLs, IADLs, driving, and working a Network engineer job  Functional Status:  Mobility: Bed Mobility  Overal bed mobility: Needs Assistance Bed Mobility: Supine to Sit Supine to sit: Mod assist, HOB elevated General bed mobility comments: Cues to bend right knee onto bed to assist withs cooting bottom, able to elevate trunk but needs assist with  LLE to get to EOB. Transfers Overall transfer level: Needs assistance Equipment used: 1 person hand held assist Transfers: Sit to/from Stand, Stand Pivot Transfers Sit to Stand: Mod assist Stand pivot transfers: Mod assist, +2 safety/equipment General transfer comment: Assist to power to standing with cues for anterior weight shift. Assist to support left knee and elevate hips. Stood from [employer], from chair x2. SPT bed to chair with MOd A of 2 to sequence LEs. Ambulation/Gait Ambulation/Gait assistance: Mod assist, +2 physical assistance Ambulation Distance (Feet): 14 Feet(x2 bouts) Assistive device: 1 person hand held assist Gait Pattern/deviations: Step-to pattern, Step-through pattern, Decreased stance time - left, Decreased dorsiflexion - left, Narrow base of support General Gait Details: Initially step to gait, required cues to step through with therapist supporting left knee during stance phase due to knee instability; cues for upright posture and assist for weightshifting. 1 seated rest break.  Gait velocity: decreased Gait velocity interpretation: Below normal speed for age/gender    ADL: ADL Overall ADL's : Needs assistance/impaired Eating/Feeding: Cueing for sequencing, Cueing for compensatory techinques, Bed level Eating/Feeding Details (indicate cue type and reason): reviewed use of RUE for stabalization and integration of use during self feeding.  able to partially grasp sandwich in R Hand and L And therapist asst., assisted bringing r ue up with left to take a bite. also reviewed same tech. for bringing beverage cup to mouth.   Grooming: Moderate assistance, Oral care, Sitting Grooming Details (indicate cue type and reason): Sitting in recliner, Pt requiring Mod A for bilateral tasks. Requiring hand over hand to hold tooth paste in left hand while right hand twist cap. Requiring Min VCs for problem solving Upper Body Bathing: Maximal assistance, Sitting Lower Body Bathing:  Maximal assistance, +2 for physical assistance, Sit to/from stand Upper Body Dressing : Maximal assistance, Sitting Lower Body Dressing: Maximal assistance, Sit to/from stand Toilet Transfer: +2 for physical assistance, Ambulation, Moderate assistance(Simulated to recliner) Functional mobility during ADLs: +2 for physical assistance, Cueing for sequencing, Moderate assistance General ADL Comments: reviewed other ways to integrate use of LUE/hand into adls.  discussed using for stabalization and also bringing into functional use.  husband present and active in learning the techniques.    Cognition: Cognition Overall Cognitive Status: Within Functional Limits for tasks assessed Arousal/Alertness: Awake/alert Orientation Level: Oriented X4 Attention: Alternating Alternating Attention: Appears intact Memory: Appears intact Awareness: Appears intact Problem Solving: Appears intact Cognition Arousal/Alertness: Awake/alert Behavior During Therapy: WFL for tasks assessed/performed Overall Cognitive Status: Within Functional Limits for tasks assessed General Comments: answering questions and talking during session.  received flowers during session and was able to open the card with therapist asst. helping with compensatory tech. and then read the card   Blood pressure 134/79, pulse 72, temperature 97.8 F (36.6 C), temperature source Oral, resp. rate 16, height 5' 7" (1.702 m), weight 81.2 kg (179 lb 0.2 oz), last menstrual period 01/13/2018, SpO2 98 %. Physical Exam  Nursing note and vitals reviewed. Constitutional: She is oriented to person, place, and time. She appears well-developed and well-nourished.  HENT:  Head: Normocephalic and atraumatic.  Mouth/Throat: Oropharynx is clear and moist.  Eyes: Pupils are equal, round, and reactive to light. Conjunctivae and EOM are normal. Right eye exhibits no  discharge.  Neck: Normal range of motion. Neck supple.  Cardiovascular: Normal rate and  regular rhythm.  Respiratory: No stridor. No respiratory distress. She has no wheezes.  GI: Soft. Bowel sounds are normal. She exhibits no distension. There is no tenderness.  Musculoskeletal: She exhibits no edema or tenderness.  LUE flaccid with one finger subluxation at shoulder.   Neurological: She is alert and oriented to person, place, and time.  Soft voice.  Left facial weakness.  Left inattention noted.  Able to follow simple one step commands without difficulty.  Motor: LUE: 0/5 proximal to distal LLE: HF 2+/5, distally 0/5 Sensation diminished to light touch LUE/LLE  Skin: Skin is warm and dry.  Psychiatric: She has a normal mood and affect. Her behavior is normal.    Results for orders placed or performed during the hospital encounter of 02/13/18 (from the past 48 hour(s))  CBC     Status: Abnormal   Collection Time: 02/16/18  7:17 AM  Result Value Ref Range   WBC 7.5 4.0 - 10.5 K/uL   RBC 4.73 3.87 - 5.11 MIL/uL   Hemoglobin 8.2 (L) 12.0 - 15.0 g/dL   HCT 29.8 (L) 36.0 - 46.0 %   MCV 63.0 (L) 78.0 - 100.0 fL   MCH 17.3 (L) 26.0 - 34.0 pg   MCHC 27.5 (L) 30.0 - 36.0 g/dL   RDW 21.6 (H) 11.5 - 15.5 %   Platelets 422 (H) 150 - 400 K/uL    Comment: Performed at [facility], [address], [place], [place] [address]  Basic metabolic panel     Status: Abnormal   Collection Time: 02/16/18  7:17 AM  Result Value Ref Range   Sodium 140 135 - 145 mmol/L   Potassium 3.3 (L) 3.5 - 5.1 mmol/L   Chloride 105 101 - 111 mmol/L   CO2 24 22 - 32 mmol/L   Glucose, Bld 89 65 - 99 mg/dL   BUN 7 6 - 20 mg/dL   Creatinine, Ser 0.75 0.44 - 1.00 mg/dL   Calcium 8.7 (L) 8.9 - 10.3 mg/dL   GFR calc non Af Amer >60 >60 mL/min   GFR calc Af Amer >60 >60 mL/min    Comment: (NOTE) The eGFR has been calculated using the CKD EPI equation. This calculation has not been validated in all clinical situations. eGFR's persistently <60 mL/min signify possible Chronic Kidney Disease.     Anion gap 11 5 - 15    Comment: Performed at [facility] [address], [place], [place] [address]  Glucose, capillary     Status: None   Collection Time: 02/16/18 12:00 PM  Result Value Ref Range   Glucose-Capillary 97 65 - 99 mg/dL  CBC     Status: Abnormal   Collection Time: 02/17/18  4:52 AM  Result Value Ref Range   WBC 8.2 4.0 - 10.5 K/uL   RBC 4.74 3.87 - 5.11 MIL/uL   Hemoglobin 8.1 (L) 12.0 - 15.0 g/dL    Comment: REPEATED TO VERIFY   HCT 29.7 (L) 36.0 - 46.0 %   MCV 62.7 (L) 78.0 - 100.0 fL   MCH 17.1 (L) 26.0 - 34.0 pg   MCHC 27.3 (L) 30.0 - 36.0 g/dL   RDW 21.6 (H) 11.5 - 15.5 %   Platelets 416 (H) 150 - 400 K/uL    Comment: REPEATED TO VERIFY Performed at [facility], [place] [address], [place], [place] [address]   Basic metabolic  panel     Status: Abnormal   Collection Time: 02/17/18  4:52 AM  Result Value Ref Range   Sodium 140 135 - 145 mmol/L   Potassium 3.8 3.5 - 5.1 mmol/L   Chloride 106 101 - 111 mmol/L   CO2 24 22 - 32 mmol/L   Glucose, Bld 74 65 - 99 mg/dL   BUN 10 6 - 20 mg/dL   Creatinine, Ser 0.80 0.44 - 1.00 mg/dL   Calcium 8.8 (L) 8.9 - 10.3 mg/dL   GFR calc non Af Amer >60 >60 mL/min   GFR calc Af Amer >60 >60 mL/min    Comment: (NOTE) The eGFR has been calculated using the CKD EPI equation. This calculation has not been validated in all clinical situations. eGFR's persistently <60 mL/min signify possible Chronic Kidney Disease.    Anion gap 10 5 - 15    Comment: Performed at [facility] [address] [place], [place] [address]  Glucose, capillary     Status: None   Collection Time: 02/17/18 11:57 AM  Result Value Ref Range   Glucose-Capillary 96 65 - 99 mg/dL   Comment 1 Notify RN    Comment 2 Document in Chart    Ct Head Wo Contrast  Result Date: 02/16/2018 CLINICAL DATA:  Followup intracranial hemorrhage. EXAM: CT HEAD WITHOUT CONTRAST TECHNIQUE: Contiguous axial images were obtained from the base of the skull  through the vertex without intravenous contrast. COMPARISON:  02/13/2018 FINDINGS: Brain: No additional hemorrhage related to the parenchymal hemorrhage or hemorrhagic infarction in the right basal ganglia/external capsule region. Maximal dimension of the hyperdense clot measures 3.8 cm. Surrounding vasogenic edema is slightly increased as expected. Similar mass effect with flattening of the right lateral ventricle and right-to-left midline shift of 5 mm. No new insult. No other hemorrhage. No subarachnoid or intraventricular penetration. Vascular: No abnormal vascular finding. Skull: Normal Sinuses/Orbits: Clear/normal Other: None IMPRESSION: No increased bleeding in the right lateral basal ganglia/external capsule region. Maximal hematoma dimension is 3.8 cm. Slightly more surrounding vasogenic edema with right to left shift of 5 mm. Electronically Signed   By: Nelson Chimes M.D.   On: 02/16/2018 07:43       Medical Problem List and Plan: 1.  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 2.  DVT Prophylaxis/Anticoagulation: Mechanical: Sequential compression devices, below knee Bilateral lower extremities 3. Pain Management: N/A.  4. Mood: LCSW to follow for evaluation and support.  5. Neuropsych: This patient is not fully capable of making decisions on her own behalf. 6. Skin/Wound Care:  Routine pressure relief measures.  7. Fluids/Electrolytes/Nutrition: Monitor I/O. Check lytes in am. 8. HTN: Monitor BP bid. Continue Norvasc. Titrate Prinivil upwards for tighter control.  9.  Acute on chronic anemia: Continue iron supplement.   Post Admission Physician Evaluation: 1. Preadmission assessment reviewed and changes made below. 2. Functional deficits secondary  to right basal ganglia hemorrhage. 3. Patient is admitted to receive collaborative, interdisciplinary care between the physiatrist, rehab nursing staff, and therapy  team. 4. Patient's level of medical complexity and substantial therapy needs in context of that medical necessity cannot be provided at a lesser intensity of care such as a SNF. 5. Patient has experienced substantial functional loss from his/her baseline which was documented above under the "Functional History" and "Functional Status" headings.  Judging by the patient's diagnosis, physical exam, and functional history, the patient has potential for functional progress which will result  in measurable gains while on inpatient rehab.  These gains will be of substantial and practical use upon discharge  in facilitating mobility and self-care at the household level. 26. Physiatrist will provide 24 hour management of medical needs as well as oversight of the therapy plan/treatment and provide guidance as appropriate regarding the interaction of the two. 7. 24 hour rehab nursing will assist with bladder management, safety, disease management, medication administration and patient education  and help integrate therapy concepts, techniques,education, etc. 8. PT will assess and treat for/with: Lower extremity strength, range of motion, stamina, balance, functional mobility, safety, adaptive techniques and equipment, coping skills, pain control, stroke education.   Goals are: Min A. 9. OT will assess and treat for/with: ADL's, functional mobility, safety, upper extremity strength, adaptive techniques and equipment, ego support, and community reintegration.   Goals are: Min A. Therapy may proceed with showering this patient. 10. SLP will assess and treat for/with: speech, language, cognition.  Goals are: Min A. 11. Case Management and Social Worker will assess and treat for psychological issues and discharge planning. 12. Team conference will be held weekly to assess progress toward goals and to determine barriers to discharge. 13. Patient will receive at least 3 hours of therapy per day at least 5 days per  week. 14. ELOS: 18-22 days.       15. Prognosis:  good  Delice Lesch, MD, ABPMR Bary Leriche, PA-C 02/17/2018

## 2018-02-18 ENCOUNTER — Inpatient Hospital Stay (HOSPITAL_COMMUNITY): Payer: 59 | Admitting: Occupational Therapy

## 2018-02-18 ENCOUNTER — Inpatient Hospital Stay (HOSPITAL_COMMUNITY): Payer: 59

## 2018-02-18 DIAGNOSIS — D638 Anemia in other chronic diseases classified elsewhere: Secondary | ICD-10-CM

## 2018-02-18 DIAGNOSIS — G8320 Monoplegia of upper limb affecting unspecified side: Secondary | ICD-10-CM

## 2018-02-18 DIAGNOSIS — I10 Essential (primary) hypertension: Secondary | ICD-10-CM

## 2018-02-18 DIAGNOSIS — I610 Nontraumatic intracerebral hemorrhage in hemisphere, subcortical: Secondary | ICD-10-CM

## 2018-02-18 DIAGNOSIS — D62 Acute posthemorrhagic anemia: Secondary | ICD-10-CM

## 2018-02-18 LAB — COMPREHENSIVE METABOLIC PANEL
ALT: 11 U/L — AB (ref 14–54)
AST: 15 U/L (ref 15–41)
Albumin: 3.7 g/dL (ref 3.5–5.0)
Alkaline Phosphatase: 72 U/L (ref 38–126)
Anion gap: 10 (ref 5–15)
BUN: 10 mg/dL (ref 6–20)
CALCIUM: 9.1 mg/dL (ref 8.9–10.3)
CHLORIDE: 105 mmol/L (ref 101–111)
CO2: 23 mmol/L (ref 22–32)
CREATININE: 0.75 mg/dL (ref 0.44–1.00)
GFR calc Af Amer: >60 mL/min (ref >60)
Glucose, Bld: 93 mg/dL (ref 65–99)
Potassium: 3.7 mmol/L (ref 3.5–5.1)
Sodium: 138 mmol/L (ref 135–145)
Total Bilirubin: 0.6 mg/dL (ref 0.3–1.2)
Total Protein: 7.9 g/dL (ref 6.5–8.1)

## 2018-02-18 LAB — CBC WITH DIFFERENTIAL/PLATELET
BASOS PCT: 0 %
Basophils Absolute: 0 10*3/uL (ref 0.0–0.1)
EOS PCT: 7 %
Eosinophils Absolute: 0.6 10*3/uL (ref 0.0–0.7)
HEMATOCRIT: 32.3 % — AB (ref 36.0–46.0)
HEMOGLOBIN: 9.1 g/dL — AB (ref 12.0–15.0)
LYMPHS PCT: 23 %
Lymphs Abs: 2 10*3/uL (ref 0.7–4.0)
MCH: 17.5 pg — ABNORMAL LOW (ref 26.0–34.0)
MCHC: 28.2 g/dL — ABNORMAL LOW (ref 30.0–36.0)
MCV: 62 fL — AB (ref 78.0–100.0)
MONO ABS: 0.5 10*3/uL (ref 0.1–1.0)
Monocytes Relative: 6 %
Neutro Abs: 5.7 10*3/uL (ref 1.7–7.7)
Neutrophils Relative %: 64 %
PLATELETS: 451 10*3/uL — AB (ref 150–400)
RBC: 5.21 MIL/uL — AB (ref 3.87–5.11)
RDW: 21.5 % — ABNORMAL HIGH (ref 11.5–15.5)
WBC: 8.8 10*3/uL (ref 4.0–10.5)

## 2018-02-18 MED ORDER — FLUOXETINE HCL 10 MG PO CAPS
10.0000 mg | ORAL_CAPSULE | Freq: Every day | ORAL | Status: DC
Start: 2018-02-18 — End: 2018-03-09
  Administered 2018-02-18 – 2018-03-09 (×20): 10 mg via ORAL
  Filled 2018-02-18 (×20): qty 1

## 2018-02-18 NOTE — Evaluation (Signed)
Physical Therapy Assessment and Plan  Patient Details  Name: Jocelyn [name] MRN: [id number] Date of Birth: 1966/03/24  PT Diagnosis: Abnormal posture, Difficulty walking, Hemiparesis non-dominant and Muscle weakness Rehab Potential: Good ELOS: 2.5 weeks   Today's Date: 02/18/2018 PT Individual Time: 0802-0912 PT Individual Time Calculation (min): 70 min    Problem List:  Patient Active Problem List   Diagnosis Date Noted  . Acute blood loss anemia   . 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 Hyperlipemia 02/16/2018  . Cerebral edema ([place]) 02/16/2018  . Benign essential HTN   . Anemia of chronic disease   . Amphetamine abuse ([place])   . Hypokalemia   . 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 Pneumonia, community acquired 09/23/2015  . Microcytic hypochromic anemia 09/23/2015  . Tobacco abuse 09/23/2015  . Pneumonia 09/23/2015  . Nicotine addiction 12/09/2013  . Microcytic anemia 12/09/2013  . HTN (hypertension) 12/09/2013  . BMI 36.0-36.9,adult 12/09/2013    Past Medical History:  Past Medical History:  Diagnosis Date  . Anemia   . Hypertension   . ICH (intracerebral hemorrhage) ([place]) 02/2018   Past Surgical History:  Past Surgical History:  Procedure Laterality Date  . MYOMECTOMY      Assessment & Plan Clinical Impression: Patient is a 52 y.o. year old female with history of untreated hypertension, tobacco use and anemia who was admitted on 02/14/2015 after found by family with left-sided weakness left facial droop and slurred speech. Last seen normal night before.   History taken from chart review and patient. CT of head done showed right basal ganglia hemorrhage with mild edema and leftward shift. Blood pressure 148/101 and she was started on clevidipine as well as hypertonic saline for edema control. CTA head/neck done revealing patent carotid and vertebral arteries, no large vessel occlusion, aneurysm, AVM  or significant  stenosis and stable bleed.  MBS done and patient started on regular diet. Blood pressures continue to be labile and medication have been titrated for better control. She has been weaned off hypertonic saline and medically stable. Patient with resultant receptive aphasia, left sided weakness and cognitive deficits. Therapy ongoing and CIR recommended due to functional deficits.    Patient transferred to CIR on 02/17/2018 .   Patient currently requires mod with mobility secondary to muscle weakness, decreased coordination and decreased motor planning, decreased attention and decreased awareness and decreased sitting balance, decreased standing balance, decreased postural control, hemiplegia and decreased balance strategies.  Prior to hospitalization, patient was independent  with mobility and lived with Spouse in a [place] home.  Home access is  Level entry.  Patient will benefit from skilled PT intervention to maximize safe functional mobility, minimize fall risk and decrease caregiver burden for planned discharge home with intermittent assist.  Anticipate patient will benefit from follow up [facility] at discharge.  PT - End of Session Activity Tolerance: Tolerates 30+ min activity with multiple rests Endurance Deficit: Yes PT Assessment Rehab Potential (ACUTE/IP ONLY): Good PT Barriers to Discharge: Decreased caregiver support PT Barriers to Discharge Comments: husband works full time PT Patient demonstrates impairments in the following area(s): Balance;Behavior;Endurance;Motor;Pain;Safety;Sensory PT Transfers Functional Problem(s): Bed Mobility;Bed to Chair;Car;Furniture;Floor PT Locomotion Functional Problem(s): Ambulation;Wheelchair Mobility;Stairs PT Plan PT Intensity: Minimum of 1-2 x/day ,45 to 90 minutes PT Frequency: 5 out of 7 days PT Duration Estimated Length of Stay: 2.5 weeks PT Treatment/Interventions: Ambulation/gait training;Therapeutic Activities;Functional mobility training;Discharge  planning;Balance/vestibular training;Disease management/prevention;Neuromuscular re-education;Therapeutic Exercise;Wheelchair propulsion/positioning;Cognitive remediation/compensation;DME/adaptive equipment instruction;Splinting/orthotics;UE/LE Strength  taining/ROM;Community reintegration;Functional electrical stimulation;Patient/family education;Stair training;UE/LE Coordination activities PT Transfers Anticipated Outcome(s): supervision PT Locomotion Anticipated Outcome(s): supervision PT Recommendation Follow Up Recommendations: 24 hour supervision/assistance;Home health PT Patient destination: Home Equipment Recommended: To be determined  Skilled Therapeutic Intervention Evaluation completed (see details above and below) with education on PT POC and goals and individual treatment initiated with focus on functional transfers, stroke education, and gait. Pt supine in bed upon PT arrival, agreeable to therapy tx and denies pain. Physical therapy examination completed as detailed below. Pt transferred from supine>sitting EOB with min assist and use of bedrails. Pt donned gown in sitting with min assist. Pt performed sit<>stand from EOB with mod assist and stand pivot to w/c with mod assist, verbal cues for techniques. Pt performed w/c mobility 80 ft with min assist to steer, R hemi technique. Pt transferred from w/c<>car with max assist to stand and mod assist for stand pivot. Pt ambulated x 15 ft using R handrail and mod assist, manual facilitation for L knee control, verbal cues for sequencing and step length. Pt transported back to room and transferred to bed with mod assist, stand pivot. Educated on exercises to perform in w/c or bed for L LE strengthening/NMR. Educated pt and husband on stroke recovery and prognosis. Pt left supine in bed with needs in reach and husband present.    PT Evaluation Precautions/Restrictions Precautions Precautions: Fall Precaution Comments: watch BP (DBP  elevation) Restrictions Weight Bearing Restrictions: No Home Living/Prior Functioning Home Living Available Help at Discharge: Available PRN/intermittently(husband works 7a-3p) Type of Home: Apartment Home Access: Level entry Home Layout: One level Additional Comments: worked Network engineer job, Insurance underwriter  Lives With: Spouse Prior Function Level of Independence: Independent with basic ADLs;Independent with gait  Able to Take Stairs?: Yes Driving: Yes Vocation: Full time employment Cognition Overall Cognitive Status: Within Functional Limits for tasks assessed Arousal/Alertness: Awake/alert Orientation Level: Oriented X4 Alternating Attention: Appears intact Memory: Appears intact Awareness: Appears intact Problem Solving: Appears intact Sensation Sensation Light Touch: Appears Intact Proprioception: Appears Intact Additional Comments: sensation grossly intact B LEs Coordination Gross Motor Movements are Fluid and Coordinated: No Fine Motor Movements are Fluid and Coordinated: No Coordination and Movement Description: impared L LE Heel Shin Test: impaired L LE Motor  Motor Motor: Hemiplegia Motor - Skilled Clinical Observations: L hemiparesis  Trunk/Postural Assessment  Cervical Assessment Cervical Assessment: Within Functional Limits Thoracic Assessment Thoracic Assessment: Within Functional Limits Lumbar Assessment Lumbar Assessment: Within Functional Limits Postural Control Postural Control: Deficits on evaluation  Balance Balance Balance Assessed: Yes Static Sitting Balance Static Sitting - Level of Assistance: 5: Stand by assistance Dynamic Sitting Balance Dynamic Sitting - Level of Assistance: 4: Min assist Static Standing Balance Static Standing - Level of Assistance: 3: Mod assist Dynamic Standing Balance Dynamic Standing - Level of Assistance: 3: Mod assist;2: Max assist Extremity Assessment   RLE Assessment RLE Assessment: Within Functional  Limits LLE Assessment LLE Assessment: Exceptions to [facility] LLE Strength Left Hip Flexion: 3-/5 Left Hip Extension: 3-/5 Left Knee Flexion: 2/5 Left Knee Extension: 3-/5 Left Ankle Dorsiflexion: 0/5 Left Ankle Plantar Flexion: 1/5   See Function Navigator for Current Functional Status.   Refer to Care Plan for Long Term Goals  Recommendations for other services: Therapeutic Recreation  Outing/community reintegration  Discharge Criteria: Patient will be discharged from PT if patient refuses treatment 3 consecutive times without medical reason, if treatment goals not met, if there is a change in medical status, if patient makes no progress towards goals  or if patient is discharged from hospital.  The above assessment, treatment plan, treatment alternatives and goals were discussed and mutually agreed upon: by patient and by family  Netta Corrigan, PT, DPT 02/18/2018, 9:15 AM

## 2018-02-18 NOTE — Progress Notes (Addendum)
Physical Medicine and Rehabilitation Consult Reason for Consult: Sided weakness with facial droop and slurred speech Referring Physician: Dr.Xu   HPI: Jocelyn [name] is a 52 y.o. right-handed female with history of hypertension and anemia as well as tobacco abuse.  Per chart review and family, patient lives alone currently separated.  One level apartment.  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  Her husband can assist but works during the day.  Presented 02/13/2018 with left-sided weakness and lethargy.  Blood pressure 148/101.  UDS positive amphetamines.  Cranial CT scan reviewed, showing right basal ganglia hemorrhage. Per report, acute right basal ganglia hemorrhage with mild edema and 4 mm left midline shift.  CT Angio of head and neck with no dissection or aneurysm.  Echocardiogram with ejection fraction of 65% no wall motion abnormalities.  Treated with 3% saline for cerebral edema.  Close monitoring of blood pressure.  Plan follow-up CT of the head 02/16/2018. Physical therapy evaluation completed with recommendations of physical medicine rehab consult.   Review of Systems  Constitutional: Negative for chills and fever.  HENT: Negative for hearing loss.   Eyes: Negative for blurred vision.  Respiratory: Negative for shortness of breath.   Cardiovascular: Negative for chest pain, palpitations and leg swelling.  Gastrointestinal: Positive for constipation. Negative for nausea and vomiting.  Genitourinary: Negative for dysuria, flank pain and hematuria.  Musculoskeletal: Positive for myalgias.  Skin: Negative for rash.  Neurological: Positive for speech change, weakness and headaches.  All other systems reviewed and are negative.      Past Medical History:  Diagnosis Date  . Anemia   . Hypertension         Past Surgical History:  Procedure Laterality Date  . MYOMECTOMY          Family History  Problem Relation Age of Onset  .  Diabetes Mellitus II Neg Hx   . Hypertension Neg Hx    Social History:  reports that she has been smoking cigarettes.  She has a 2.00 pack-year smoking history. She has never used smokeless tobacco. She reports that she drinks alcohol. She reports that she does not use drugs. Allergies: No Known Allergies       Medications Prior to Admission  Medication Sig Dispense Refill  . acetaminophen (TYLENOL) 500 MG tablet Take 500 mg by mouth every 6 (six) hours as needed for headache (pain).    . ferrous sulfate 325 (65 FE) MG tablet Take 1 tablet (325 mg total) by mouth 2 (two) times daily with a meal. (Patient not taking: Reported on 07/21/2017) 60 tablet 3  . hydrochlorothiazide (MICROZIDE) 12.5 MG capsule Take 1 capsule (12.5 mg total) by mouth daily. 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, Environmental education officer History: Prior Function Level of Independence: Independent Functional Status:  Mobility: Bed Mobility Overal bed mobility: Needs Assistance Bed Mobility: Supine to Sit Supine to sit: Mod assist, HOB elevated General bed mobility comments: assist for L leg and trunk upright with cues for technique and hand placement Transfers Overall transfer level: Needs assistance Equipment used: 1 person hand held assist, 2 person hand held assist  Transfers: Sit to/from Stand Sit to Stand: Max assist, Mod assist, +2 physical assistance General transfer comment: initially with +1 A needed lifting help and block for L leg, mod cues and facilitation for anterior weight shift;    Ambulation/Gait Ambulation/Gait assistance: Mod assist, +2 physical assistance, Max assist Ambulation Distance (Feet): 3 Feet Assistive device: 2 person hand held assist Gait Pattern/deviations: Step-to pattern, Decreased stride length, Decreased dorsiflexion - left, Decreased stance time - left General Gait Details: assist to block L knee, initially assist to progress L LE, then able to move it once standing on it some, mod cues for posture, assist for weight shift and leaning to L.   ADL:  Cognition: Cognition Overall Cognitive Status: Within Functional Limits for tasks assessed Orientation Level: Oriented X4 Cognition Arousal/Alertness: Awake/alert Behavior During Therapy: WFL for tasks assessed/performed Overall Cognitive Status: Within Functional Limits for tasks assessed  Blood pressure (!) 115/103, pulse 72, temperature 98.6 F (37 C), temperature source Oral, resp. rate 16, height 5\' 7"  (1.702 m), weight 81.2 kg (179 lb 0.2 oz), last menstrual period 01/13/2018, SpO2 100 %. Physical Exam  Vitals reviewed. Constitutional: She appears well-developed and well-nourished.  HENT:  Head: Normocephalic and atraumatic.  Eyes: EOM are normal. Right eye exhibits no discharge. Left eye exhibits no discharge.  Pupils reactive to light  Neck: Normal range of motion. Neck supple. No thyromegaly present.  Cardiovascular: Normal rate, regular rhythm and normal heart sounds.  Respiratory: Effort normal and breath sounds normal. No respiratory distress.  GI: Soft. Bowel sounds are normal. She exhibits no distension.  Musculoskeletal: She exhibits no edema or tenderness.  Neurological:  She provides her name, age and date of birth.   Follows simple commands. Left facial weakness Motor: RUE/RLE: 5/5 proximal to distal LUE: 0/5 proximal to distal LLE: HF 1+/5, distally 0/5 Sensation intact to light touch  Skin: Skin is warm and dry.  Psychiatric: Her affect is blunt.   Assessment/Plan: Diagnosis: Right basal ganglia hemorrhage Labs and images independently reviewed.  Records reviewed and summated above.  1. Does the need for close, 24 hr/day medical supervision in concert with the patient's rehab needs make it unreasonable for this patient to be served in a less intensive setting? Yes  2. Co-Morbidities requiring supervision/potential complications: HTN (monitor and provide prns in accordance with increased physical exertion and pain), anemia (transfuse if necessary to ensure appropriate perfusion for increased activity tolerance), tobacco and amphetamines abuse (counsel), hypokalemia (continue to monitor and replete as necessary) 3. Due to safety, disease management and patient education, does the patient require 24 hr/day rehab nursing? Yes 4. Does the patient require coordinated care of a physician, rehab nurse, PT (1-2 hrs/day, 5 days/week) and OT (1-2 hrs/day, 5 days/week) to address physical and functional deficits in the context of the above medical diagnosis(es)? Yes Addressing deficits in the following areas: balance, endurance, locomotion, strength, transferring, bathing, dressing, toileting and psychosocial support 5. Can the patient actively participate in an intensive therapy program of at least 3 hrs of therapy per day at least 5 days per week? Yes 6. The potential for patient to make measurable gains while on inpatient rehab is excellent 7. Anticipated functional outcomes upon discharge from inpatient rehab are supervision and min assist  with PT, supervision and min assist with OT, n/a with SLP. 8. Estimated rehab length of stay to reach the above functional goals is: 16-19 days. 9. Anticipated D/C setting: Home 10. Anticipated post D/C treatments: HH therapy and Home excercise program  11. Overall Rehab/Functional Prognosis: good  RECOMMENDATIONS: This patient's condition is appropriate for continued rehabilitative care in the following  setting: CIR after completion of medical workup if caregiver support available at discharge (Husband states he works). Patient has agreed to participate in recommended program. Yes Note that insurance prior authorization may be required for reimbursement for recommended care.  Comment: Rehab Admissions Coordinator to follow up.  Maryla MorrowAnkit Patel, MD, ABPMR Mcarthur Rossettianiel J Angiulli, PA-C 02/15/2018          Revision History                        Routing History

## 2018-02-18 NOTE — Evaluation (Signed)
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 Acute blood loss anemia   . 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 Hyperlipemia 02/16/2018  . Cerebral edema ([place]) 02/16/2018  . Benign essential HTN   . Anemia of chronic disease   . Amphetamine abuse ([place])   . Hypokalemia   . 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 Pneumonia, community acquired 09/23/2015  . Microcytic hypochromic anemia 09/23/2015  . Tobacco abuse 09/23/2015  . Pneumonia 09/23/2015  . Nicotine addiction 12/09/2013  . Microcytic anemia 12/09/2013  . HTN (hypertension) 12/09/2013  . BMI 36.0-36.9,adult 12/09/2013    Past Medical History:  Past Medical History:  Diagnosis Date  . Anemia   . Hypertension   . ICH (intracerebral hemorrhage) ([place]) 02/2018   Past Surgical History:  Past Surgical History:  Procedure Laterality Date  . MYOMECTOMY      Assessment & Plan Clinical Impression: Patient is a 52 y.o. year old female with history of untreated hypertension, tobacco use and anemia who was admitted on 02/14/2015 after found by family with left-sided weakness left facial droop and slurred speech. Last seen normal night before. History taken from chart review and patient. CT of head done showed right basal ganglia hemorrhage with mild edema and leftward shift. Blood pressure 148/101 and she was started on clevidipine as well as hypertonic saline for edema control. CTA head/neck done revealing patent carotid and vertebral arteries, no large vessel occlusion, aneurysm, AVM or  significant stenosis and stable bleed. MBS done and patient started on regular diet. Blood pressures continue to be labile and medication have been titrated for better control. She has been weaned off hypertonic saline and medically stable. Patient with resultant receptive aphasia, left sided weakness and cognitive deficits. Patient transferred to CIR on 02/17/2018 .    Patient currently requires max with basic self-care skills secondary to muscle weakness, abnormal tone, unbalanced muscle activation, decreased coordination and decreased motor planning, decreased problem solving, decreased memory and delayed processing and decreased sitting balance, decreased standing balance, decreased postural control, hemiplegia and decreased balance strategies.  Prior to hospitalization, patient could complete BADL with independent .  Patient will benefit from skilled intervention to increase independence with basic self-care skills prior to discharge home with care partner.  Anticipate patient will require 24 hour supervision and follow up home health.  OT - End of Session Endurance Deficit: Yes Endurance Deficit Description: Rest breaks within BADL tasks OT Assessment Rehab Potential (ACUTE ONLY): Good OT Patient demonstrates impairments in the following area(s): Balance;Endurance;Motor;Safety OT Basic ADL's Functional Problem(s): Eating;Grooming;Bathing;Dressing;Toileting OT Transfers Functional Problem(s): Toilet;Tub/Shower OT Additional Impairment(s): Fuctional Use of Upper Extremity OT Plan OT Intensity: Minimum of 1-2 x/day, 45 to 90 minutes OT Frequency: 5 out of 7 days OT Duration/Estimated Length of Stay: 2.5-3 weeks OT Treatment/Interventions: Balance/vestibular training;Cognitive remediation/compensation;Community reintegration;Discharge planning;DME/adaptive equipment instruction;Functional electrical stimulation;Functional mobility training;Neuromuscular re-education;Self Care/advanced ADL  retraining;Patient/family education;Psychosocial support;Splinting/orthotics;Therapeutic Activities;Therapeutic Exercise;UE/LE Strength taining/ROM;UE/LE Coordination activities;Wheelchair propulsion/positioning OT Self Feeding Anticipated Outcome(s): Supervision OT Basic Self-Care Anticipated Outcome(s): Supervision OT Toileting Anticipated Outcome(s): Supervision OT Bathroom Transfers Anticipated Outcome(s): Supervision  OT Recommendation Patient destination: Home Follow Up Recommendations: Home health OT Equipment Recommended: To be determined  Skilled Therapeutic Intervention Initial eval completed with treatment provided to address functional transfers, functional use of L UE, improved sit<>stand, standing tolerance, and adapted bathing/dressing skills. Pt came to sitting EOB with mod A. Pt reported need for bathroom. Stedy use to transfer pt to commode with mod A to power up. Pt voided bladder and had successful BM. Pt attempted hygiene, but needed assistance for thoroughness. Stedy then used to transfer onto tub transfer bench. Incorporated L NMR weight bearing techniques within bathing tasks with hand over hand A to grasp wash cloth. LB/UB dressing completed wc level at the sink with assistance to thread L side into clothing and assist to pull up pants. UB dressing with mod A overall 2/2 L hemiplegia. Incorporate weight bearing through L UE when standing at the sink. Pt left seated in wc at end of session with safety belt on, call bell in reach, and family members present.  OT Evaluation Precautions/Restrictions  Precautions Precautions: Fall Precaution Comments: L hemiplegia  Restrictions Weight Bearing Restrictions: No Pain Pain Assessment Pain Score: 0-No pain Home Living/Prior Functioning Home Living Family/patient expects to be discharged to:: Private residence Living Arrangements: Spouse/significant other Available Help at Discharge: Available PRN/intermittently Type of Home:  Apartment Home Access: Level entry Home Layout: One level Bathroom Shower/Tub: Chiropodist: Standard Additional Comments: worked Network engineer job, Engineer, production  Lives With: Spouse IADL History Current License: Yes Prior Function Level of Independence: Independent with basic ADLs, Independent with gait  Able to Take Stairs?: Yes Driving: Yes Vocation: Full time employment Comments: ADLs, IADLs, driving, and working a Network engineer job ADL ADL ADL Comments: Please see functional navigator Vision Baseline Vision/History: No visual deficits Patient Visual Report: No change from baseline Perception  Perception: Within Functional Limits Praxis Praxis: Intact Cognition Overall Cognitive Status: Within Functional Limits for tasks assessed Arousal/Alertness: Awake/alert Orientation Level: Person;Place;Situation Person: Oriented Place: Oriented Situation: Oriented Year: 2019 Month: April Day of Week: Correct Memory: Impaired Memory Impairment: Decreased recall of new information Immediate Memory Recall: Sock;Blue;Bed Memory Recall: Bed;Blue Memory Recall Blue: Without Cue Memory Recall Bed: With Cue Attention: Alternating Awareness: Appears intact Problem Solving: Impaired Problem Solving Impairment: Functional complex Safety/Judgment: Appears intact Sensation Sensation Light Touch: Appears Intact Proprioception: Appears Intact Coordination Gross Motor Movements are Fluid and Coordinated: No Fine Motor Movements are Fluid and Coordinated: No Coordination and Movement Description: impared L LE, L hemiplegia Motor  Motor Motor: Hemiplegia Motor - Skilled Clinical Observations: L hemiparesis Trunk/Postural Assessment  Cervical Assessment Cervical Assessment: Within Functional Limits Thoracic Assessment Thoracic Assessment: Within Functional Limits Lumbar Assessment Lumbar Assessment: Within Functional Limits Postural Control Postural Control:  Deficits on evaluation  Balance Balance Balance Assessed: Yes Static Sitting Balance Static Sitting - Level of Assistance: 5: Stand by assistance Dynamic Sitting Balance Dynamic Sitting - Level of Assistance: 4: Min assist Static Standing Balance Static Standing - Level of Assistance: 3: Mod assist Dynamic Standing Balance Dynamic Standing - Level of Assistance: 3: Mod assist;2: Max assist Extremity/Trunk Assessment RUE Assessment RUE Assessment: Within Functional Limits LUE Assessment LUE Assessment: Exceptions to WFL LUE Tone LUE Tone: Flaccid LUE Tone Comments: Brunnstrom Stage II   See Function Navigator for Current Functional Status.   Refer to Care Plan for Long Term Goals  Recommendations for other services: None at this time   Discharge Criteria: Patient will be discharged from OT if patient refuses treatment 3  consecutive times without medical reason, if treatment goals not met, if there is a change in medical status, if patient makes no progress towards goals or if patient is discharged from hospital.  The above assessment, treatment plan, treatment alternatives and goals were discussed and mutually agreed upon: by patient  Valma Cava 02/18/2018, 4:31 PM

## 2018-02-18 NOTE — Progress Notes (Signed)
[facility] PHYSICAL MEDICINE & REHABILITATION     PROGRESS NOTE  Subjective/Complaints:  Patient seen lying in bed this morning. Husband at bedside. Patient states she slept well overnight and she is ready to begin therapies today.  ROS: Denies CP, SOB, nausea, vomiting diarrhea.  Objective: Vital Signs: Blood pressure 129/71, pulse 77, temperature 98.3 F (36.8 C), temperature source Oral, resp. rate 18, height 5\' 7"  (1.702 m), weight 83.6 kg (184 lb 4.9 oz), SpO2 99 %. No results found. Recent Labs    02/16/18 0717 02/17/18 0452  WBC 7.5 8.2  HGB 8.2* 8.1*  HCT 29.8* 29.7*  PLT 422* 416*   Recent Labs    02/16/18 0717 02/17/18 0452  NA 140 140  K 3.3* 3.8  CL 105 106  GLUCOSE 89 74  BUN 7 10  CREATININE 0.75 0.80  CALCIUM 8.7* 8.8*   CBG (last 3)  Recent Labs    02/16/18 1200 02/17/18 1157  GLUCAP 97 96    Wt Readings from Last 3 Encounters:  02/17/18 83.6 kg (184 lb 4.9 oz)  02/13/18 81.2 kg (179 lb 0.2 oz)  07/21/17 80.3 kg (177 lb)    Physical Exam:  BP 129/71 (BP Location: Right Arm)   Pulse 77   Temp 98.3 F (36.8 C) (Oral)   Resp 18   Ht 5\' 7"  (1.702 m)   Wt 83.6 kg (184 lb 4.9 oz)   SpO2 99%   BMI 28.87 kg/m  Constitutional: She appears well-developed and well-nourished. NAD. HENT: Normocephalic and atraumatic.  Eyes: EOM are normal. No discharge.  Cardiovascular: Normal rate and regular rhythm. No JVD. Respiratory: No respiratory distress. Clear. GI: Bowel sounds are normal. She exhibits no distension.  Musculoskeletal: She exhibits no edema or tenderness.    Neurological: She is alert and oriented Soft voice.  Left facial weakness.  Left inattention noted.  Able to follow simple one step commands without difficulty.  Motor: LUE: 0/5 proximal to distal LLE: HF 2+/5, distally 0/5 Skin: Skin is warm and dry.  Psychiatric: She has a normal mood and affect. Her behavior is normal.   Assessment/Plan: 1. Functional deficits secondary to  right basal ganglia hemorrhage which require 3+ hours per day of interdisciplinary therapy in a comprehensive inpatient rehab setting. Physiatrist is providing close team supervision and 24 hour management of active medical problems listed below. Physiatrist and rehab team continue to assess barriers to discharge/monitor patient progress toward functional and medical goals.  Function:  Bathing Bathing position      Bathing parts      Bathing assist        Upper Body Dressing/Undressing Upper body dressing                    Upper body assist        Lower Body Dressing/Undressing Lower body dressing                                  Lower body assist        Toileting Toileting          Toileting assist     Transfers Chair/bed transfer             Locomotion Ambulation           Wheelchair          Cognition Comprehension Comprehension assist level: Understands complex 90% of the time/cues 10%  of the time  Expression Expression assist level: Expresses complex ideas: With extra time/assistive device  Social Interaction Social Interaction assist level: Interacts appropriately 90% of the time - Needs monitoring or encouragement for participation or interaction.  Problem Solving Problem solving assist level: Solves complex 90% of the time/cues < 10% of the time  Memory Memory assist level: More than reasonable amount of time     Medical Problem List and Plan: 1.  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   Begin CIR   PRAFO, WHO ordered   Fluoxetine 10 started on 4/13 2.  DVT Prophylaxis/Anticoagulation: Mechanical: Sequential compression devices, below knee Bilateral lower extremities 3. Pain Management: N/A.  4. Mood: LCSW to follow for evaluation and support.  5. Neuropsych: This patient is not fully capable of making decisions on her own behalf. 6. Skin/Wound Care:   Routine pressure relief measures.  7. Fluids/Electrolytes/Nutrition: Monitor I/Os.    BNP within acceptable range on 4/12, labs pending for today 8. HTN: Monitor BP bid. Continue Norvasc. Titrate Prinivil upwards for tighter control.    Monitor with increased mobility 9.  Acute on chronic anemia: Continue iron supplement.    Hemoglobin 8.1 on 4/12  Labs pending for today  LOS (Days) 1 A FACE TO FACE EVALUATION WAS PERFORMED  Ankit Karis Jubanil Patel 02/18/2018 7:55 AM

## 2018-02-18 NOTE — Progress Notes (Signed)
Orthopedic Tech Progress Note Patient Details:  Tobie PoetMickie R Hopple 22-Jan-1966 [id number] Brace completed by hanger Patient ID: Tobie PoetMickie R Gola, female   DOB: 22-Jan-1966, 52 y.o.   MRN: [id number]   Jennye MoccasinHughes, Karol Liendo Craig 02/18/2018, 7:21 PM

## 2018-02-18 NOTE — Evaluation (Signed)
Speech Language Pathology Assessment and Plan  Patient Details  Name: Jocelyn [name] MRN: [id number] Date of Birth: 01/12/1966  SLP Diagnosis: Cognitive Impairments;Dysarthria  Rehab Potential: Good ELOS: 18-22 days ( much shorter for ST)    Today's Date: 02/18/2018 SLP Individual Time: 1230-1300 / 6010-9323 SLP Individual Time Calculation (min): 30 min /19 mins   Problem List:  Patient Active Problem List   Diagnosis Date Noted  . Acute blood loss anemia   . 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 Hyperlipemia 02/16/2018  . Cerebral edema ([place]) 02/16/2018  . Benign essential HTN   . Anemia of chronic disease   . Amphetamine abuse ([place])   . Hypokalemia   . 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 Pneumonia, community acquired 09/23/2015  . Microcytic hypochromic anemia 09/23/2015  . Tobacco abuse 09/23/2015  . Pneumonia 09/23/2015  . Nicotine addiction 12/09/2013  . Microcytic anemia 12/09/2013  . HTN (hypertension) 12/09/2013  . BMI 36.0-36.9,adult 12/09/2013   Past Medical History:  Past Medical History:  Diagnosis Date  . Anemia   . Hypertension   . ICH (intracerebral hemorrhage) ([place]) 02/2018   Past Surgical History:  Past Surgical History:  Procedure Laterality Date  . MYOMECTOMY      Assessment / Plan / Recommendation Clinical Impression Jocelyn [name] is a 52 year old female with history of untreated hypertension, tobacco use and anemia who was admitted on 02/14/2015 after found by family with left-sided weakness left facial droop and slurred speech. Last seen normal night before.   History taken from chart review and patient. CT of head done showed right basal ganglia hemorrhage with mild edema and leftward shift. Blood pressure 148/101 and she was started on clevidipine as well as hypertonic saline for edema control. CTA head/neck done revealing patent carotid and vertebral arteries, no large vessel occlusion,  aneurysm, AVM  or significant stenosis and stable bleed.  MBS done and patient started on regular diet. Blood pressures continue to be labile and medication have been titrated for better control. She has been weaned off hypertonic saline and medically stable. Patient with resultant receptive aphasia, left sided weakness and cognitive deficits. Therapy ongoing and CIR recommended due to functional deficits  Pt admitted to CIR on 02/17/2018 and evaluated for cognitive linguistic and swallow skills on 02/18/2018. Pt presents with min dysarthric speech in conversation with 90% intelligibility, requiring supervision A verbal for use of speech intelligibility strategies. Pt presents with min impairment in semi-complex problem solving, requiring supervision A verbal cues, noted in performance of subsections of ALFA in money and medication management. Pt demonstrates Mod I function recall, awareness, attention and language. Pt presents with min impairment in labial movement on left side, however does not impact swallow function. Pt demonstrated WFL of swallow function with regular textured foods and thin liquids via cup/straw including 3 consecutive sips water test with no overt s/s aspiration. Pt would benefit from skilled ST services in order to maximize functional independence and reduce burden of care prior to discharge to reach Mod I requiring no further ST services.    Skilled Therapeutic Interventions          Skilled ST services focused on speech skills. SLP provided speech intelligibility strategies, including visual aid posted in room. Pt demonstrated recall of speech intelligibility strategies and supervision A verbal cues in conversation to utilize strategies with 90% intelligibility.  Pt was left in room with husband and call bell within reach. Reccomend to continue  skilled ST services of 1-2 more session before discharge demonstrating Mod I.   SLP Assessment  Patient will need skilled [place]  Pathology Services during CIR admission    Recommendations  SLP Diet Recommendations: Thin Liquid Administration via: Cup;Straw Medication Administration: Whole meds with liquid Supervision: Patient able to self feed Compensations: Slow rate;Small sips/bites Postural Changes and/or Swallow Maneuvers: Seated upright 90 degrees Oral Care Recommendations: Oral care BID Patient destination: Home Follow up Recommendations: None Equipment Recommended: None recommended by SLP    SLP Frequency 1 to 3 out of 7 days   SLP Duration  SLP Intensity  SLP Treatment/Interventions 18-22 days ( much shorter for ST)  Minumum of 1-2 x/day, 30 to 90 minutes  Cueing hierarchy;Cognitive remediation/compensation;Functional tasks;Speech/Language facilitation    Pain Pain Assessment Pain Score: 0-No pain  Prior Functioning Cognitive/Linguistic Baseline: Within functional limits Type of Home: Apartment  Lives With: Spouse Available Help at Discharge: Available PRN/intermittently Vocation: Full time employment  Function:  Eating Eating   Modified Consistency Diet: No Eating Assist Level: No help, No cues           Cognition Comprehension Comprehension assist level: Understands complex 90% of the time/cues 10% of the time  Expression   Expression assist level: Expresses complex 90% of the time/cues < 10% of the time  Social Interaction Social Interaction assist level: Interacts appropriately 90% of the time - Needs monitoring or encouragement for participation or interaction.  Problem Solving Problem solving assist level: Solves complex 90% of the time/cues < 10% of the time  Memory Memory assist level: More than reasonable amount of time   Short Term Goals: Week 1: SLP Short Term Goal 1 (Week 1): Pt will demonstrate semi-complex problem solving in functional tasks at Mod I. SLP Short Term Goal 2 (Week 1): Pt will utilize speech intellgibility strategies in conversation for 90%  intelligibility at Mod I.  Refer to Care Plan for Long Term Goals  Recommendations for other services: None   Discharge Criteria: Patient will be discharged from SLP if patient refuses treatment 3 consecutive times without medical reason, if treatment goals not met, if there is a change in medical status, if patient makes no progress towards goals or if patient is discharged from hospital.  The above assessment, treatment plan, treatment alternatives and goals were discussed and mutually agreed upon: by patient and by family  Braylei Totino  [facility] 02/18/2018, 3:33 PM

## 2018-02-18 NOTE — Progress Notes (Signed)
Orthopedic Tech Progress Note Patient Details:  Jocelyn [name] 09/07/1966 [id number] Called hanger for brace. Patient ID: Jocelyn [name], female   DOB: 09/07/1966, 52 y.o.   MRN: [id number]   Jennye MoccasinHughes, [name] 02/18/2018, 6:29 PM

## 2018-02-18 NOTE — Progress Notes (Addendum)
PMR Admission Coordinator Pre-Admission Assessment  Patient: Jocelyn [name] is an 52 y.o., female MRN: [id number] DOB: Mar 08, 1966 Height: 5\' 7"  (170.2 cm) Weight: 81.2 kg (179 lb 0.2 oz)                                                                                                                                                  Insurance Information HMO:     PPO:      PCP:      IPA:      80/20:      OTHER: [employer] PRIMARY: [employer] Commercial       Policy#: [id number]      Subscriber: Self CM Name: Rebeca Alert      Phone#: [phone]     Fax#: [phone] Pre-Cert#: [id number] for 7 days (02/17/18-02/23/18) to admit with updated clinicals to be requested by CM via fax     Employer: Full Time Benefits:  Phone #: [phone]     Name: Verified online at [email or web address] Eff. Date: 11/08/17     Deduct: $1000      Out of Pocket Max: $5000      Life Max: N/A CIR: 80%/20%      SNF: 80%/20% Outpatient: 100 visits combined with pre-auth     Co-Pay: $30 per visit  Home Health: 80%      Co-Pay: 20% DME: 80%     Co-Pay: 20% Providers: In-network   SECONDARY: None        Medicaid Application Date:       Case Manager:  Disability Application Date:       Case Worker:   Emergency Contact Information         Contact Information    Name Relation Home Work Mobile   [place] Spouse   [phone]   Driscilla Grammes Sister   [phone]   [facility] Relative [phone]       Current Medical History  Patient Admitting Diagnosis: Right basal ganglia hemorrhage  History of Present Illness: Jocelyn [name] a 52 y.o.right-handed femalewith history of hypertension and anemia as well as tobacco abuse. Per chart review and family,patient lives alone currently separated. One level apartment. Independent prior to admission working a desk job as a Furniture conservator/restorer.Her husband can assist but works during the day.Presented 02/13/2018 with left-sided weakness and lethargy.  Blood pressure 148/101. UDS positive amphetamines. Cranial CT scan reviewed, showing right basal ganglia hemorrhage. Per report, acuteright basal ganglia hemorrhage with mild edema and 4 mm left midline shift. CT Angioof head and neck with no dissection or aneurysm. Echocardiogram with ejection fraction of 65% no wall motion abnormalities. Treated with 3% saline for cerebral edema. Close monitoring of blood pressure.  Physical therapy evaluation completed with recommendations of physical medicine rehab consult and patient admitted 02/17/18.  NIH Total: 7  Past Medical History  Past Medical History:  Diagnosis Date  . Anemia   . Hypertension   . ICH (intracerebral hemorrhage) (HCC) 02/2018    Family History  family history is not on file.  Prior Rehab/Hospitalizations:  Has the patient had major surgery during 100 days prior to admission? No  Current Medications   Current Facility-Administered Medications:  .   stroke: mapping our early stages of recovery book, , Does not apply, Once, Annie MainBiby, Sharon L, NP .  acetaminophen (TYLENOL) tablet 650 mg, 650 mg, Oral, Q4H PRN **OR** acetaminophen (TYLENOL) solution 650 mg, 650 mg, Per Tube, Q4H PRN **OR** acetaminophen (TYLENOL) suppository 650 mg, 650 mg, Rectal, Q4H PRN, Biby, Sharon L, NP .  amLODipine (NORVASC) tablet 10 mg, 10 mg, Oral, Daily, Marvel PlanXu, Jindong, MD, 10 mg at 02/17/18 1010 .  ferrous sulfate tablet 325 mg, 325 mg, Oral, TID WC, Biby, Sharon L, NP, 325 mg at 02/17/18 1327 .  iopamidol (ISOVUE-370) 76 % injection 50 mL, 50 mL, Intravenous, Once PRN, Biby, Sharon L, NP .  labetalol (NORMODYNE,TRANDATE) injection 10 mg, 10 mg, Intravenous, Once, Layne BentonBiby, Sharon L, NP, Stopped at 02/13/18 1526 .  lisinopril (PRINIVIL,ZESTRIL) tablet 20 mg, 20 mg, Oral, BID, Marvel PlanXu, Jindong, MD, 20 mg at 02/17/18 1010 .  senna-docusate (Senokot-S) tablet 1 tablet, 1 tablet, Oral, BID, Biby, Sharon L, NP, 1 tablet at 02/17/18 1010 .  vitamin  B-12 (CYANOCOBALAMIN) tablet 1,000 mcg, 1,000 mcg, Oral, Daily, Biby, Sharon L, NP, 1,000 mcg at 02/17/18 1010  Patients Current Diet: Fall precautions Diet Heart Room service appropriate? Yes; Fluid consistency: Thin  Precautions / Restrictions Precautions Precautions: Fall Precaution Comments: watch BP (DBP elevation) Restrictions Weight Bearing Restrictions: No   Has the patient had 2 or more falls or a fall with injury in the past year?No  Prior Activity Level Community (5-7x/wk): Prior to admission patient was fully independent, driving, and working.    Home Assistive Devices / Equipment Home Assistive Devices/Equipment: None Home Equipment: None  Prior Device Use: Indicate devices/aids used by the patient prior to current illness, exacerbation or injury? None of the above  Prior Functional Level Prior Function Level of Independence: Independent Comments: ADLs, IADLs, driving, and working a desk job  Self Care: Did the patient need help bathing, dressing, using the toilet or eating? Independent  Indoor Mobility: Did the patient need assistance with walking from room to room (with or without device)? Independent  Stairs: Did the patient need assistance with internal or external stairs (with or without device)? Independent  Functional Cognition: Did the patient need help planning regular tasks such as shopping or remembering to take medications? Independent  Current Functional Level Cognition  Arousal/Alertness: Awake/alert Overall Cognitive Status: Within Functional Limits for tasks assessed Orientation Level: Oriented X4 General Comments: answering questions and talking during session.  received flowers during session and was able to open the card with therapist asst. helping with compensatory tech. and then read the card Attention: Alternating Alternating Attention: Appears intact Memory: Appears intact Awareness: Appears intact Problem Solving:  Appears intact    Extremity Assessment (includes Sensation/Coordination)  Upper Extremity Assessment: LUE deficits/detail LUE Deficits / Details: Pt with slight grasp and no active movement at wrist, elbow, and shoulder. Brunnstrom stage 1. No withdrawl to pain.  LUE Coordination: decreased fine motor, decreased gross motor  Lower Extremity Assessment: Defer to PT evaluation LLE Deficits / Details: no active movement seen in ankle, positive knee extension about 2+/5, hip flexion about 2-/5, reports intact sensation, but question proprioception  ADLs  Overall ADL's : Needs assistance/impaired Eating/Feeding: Cueing for sequencing, Cueing for compensatory techinques, Bed level Eating/Feeding Details (indicate cue type and reason): reviewed use of RUE for stabalization and integration of use during self feeding.  able to partially grasp sandwich in R Hand and L And therapist asst., assisted bringing r ue up with left to take a bite. also reviewed same tech. for bringing beverage cup to mouth.   Grooming: Moderate assistance, Oral care, Sitting Grooming Details (indicate cue type and reason): Sitting in recliner, Pt requiring Mod A for bilateral tasks. Requiring hand over hand to hold tooth paste in left hand while right hand twist cap. Requiring Min VCs for problem solving Upper Body Bathing: Maximal assistance, Sitting Lower Body Bathing: Maximal assistance, +2 for physical assistance, Sit to/from stand Upper Body Dressing : Maximal assistance, Sitting Lower Body Dressing: Maximal assistance, Sit to/from stand Toilet Transfer: +2 for physical assistance, Ambulation, Moderate assistance(Simulated to recliner) Functional mobility during ADLs: +2 for physical assistance, Cueing for sequencing, Moderate assistance General ADL Comments: reviewed other ways to integrate use of LUE/hand into adls.  discussed using for stabalization and also bringing into functional use.  husband present and active  in learning the techniques.      Mobility  Overal bed mobility: Needs Assistance Bed Mobility: Supine to Sit Supine to sit: Mod assist, HOB elevated General bed mobility comments: Cues to bend right knee onto bed to assist withs cooting bottom, able to elevate trunk but needs assist with LLE to get to EOB.    Transfers  Overall transfer level: Needs assistance Equipment used: 1 person hand held assist Transfers: Sit to/from Stand, Stand Pivot Transfers Sit to Stand: Mod assist Stand pivot transfers: Mod assist, +2 safety/equipment General transfer comment: Assist to power to standing with cues for anterior weight shift. Assist to support left knee and elevate hips. Stood from [employer], from chair x2. SPT bed to chair with MOd A of 2 to sequence LEs.    Ambulation / Gait / Stairs / Wheelchair Mobility  Ambulation/Gait Ambulation/Gait assistance: Mod assist, +2 physical assistance Ambulation Distance (Feet): 14 Feet(x2 bouts) Assistive device: 1 person hand held assist Gait Pattern/deviations: Step-to pattern, Step-through pattern, Decreased stance time - left, Decreased dorsiflexion - left, Narrow base of support General Gait Details: Initially step to gait, required cues to step through with therapist supporting left knee during stance phase due to knee instability; cues for upright posture and assist for weightshifting. 1 seated rest break.  Gait velocity: decreased Gait velocity interpretation: Below normal speed for age/gender    Posture / Balance Dynamic Sitting Balance Sitting balance - Comments: initially at EOB leaning posteriorly, once cues for anterior weight shift able to remain with UE support and turns head without LOB Balance Overall balance assessment: Needs assistance Sitting-balance support: Feet supported, No upper extremity supported Sitting balance-Leahy Scale: Fair Sitting balance - Comments: initially at EOB leaning posteriorly, once cues for anterior weight  shift able to remain with UE support and turns head without LOB Postural control: Posterior lean Standing balance support: During functional activity Standing balance-Leahy Scale: Poor Standing balance comment: Able to stand statically with external support.     Special needs/care consideration BiPAP/CPAP: No CPM: No Continuous Drip IV: No Dialysis: No         Life Vest: No Oxygen: No Special Bed: No Trach Size: No Wound Vac (area): No       Skin: WDL  Bowel mgmt: Continent, last BM 02/16/18 Bladder mgmt: Continent but noted to be malodorous in acute chart  Diabetic mgmt: No, HgbA1c 5.3     Previous Home Environment Living Arrangements: Alone Available Help at Discharge: Family, Available 24 hours/day Type of Home: Apartment Home Layout: One level Home Access: Level entry Bathroom Shower/Tub: Engineer, manufacturing systems: Standard Home Care Services: No 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, Government social research officer for Discharge Living Setting: Apartment Type of Home at Discharge: Apartment Discharge Home Layout: One level Discharge Home Access: Level entry Discharge Bathroom Shower/Tub: Tub/shower unit Discharge Bathroom Toilet: Standard Discharge Bathroom Accessibility: Yes How Accessible: Accessible via walker Does the patient have any problems obtaining your medications?: No  Social/Family/Support Systems Patient Roles: Spouse, Other (Comment)(Friend) Contact Information: Spouse: Soil scientist Anticipated Caregiver: Spouse to take FMLA or friend and sister Anticipated Industrial/product designer Information: see above  Ability/Limitations of Caregiver: Spouse works 7-3, Mon-Fri Caregiver Availability: Other (Comment)(aware of recommendation to put together 24/7) Discharge Plan Discussed with Primary Caregiver: Yes Is Caregiver In Agreement with Plan?: Yes Does Caregiver/Family have Issues with  Lodging/Transportation while Pt is in Rehab?: No  Goals/Additional Needs Patient/Family Goal for Rehab: PT/OT: Supervision-Min A; SLP: Supervision  Expected length of stay: 16-19 days  Cultural Considerations: None Dietary Needs: Heart healthy diet restrictions Equipment Needs: TBD Additional Information: Spouse to take FMLA or have friend, sister to assist Pt/Family Agrees to Admission and willing to participate: Yes Program Orientation Provided & Reviewed with Pt/Caregiver Including Roles  & Responsibilities: Yes Additional Information Needs: Pt. and spouse aware of recommendation for 24/7 and working on a plan Information Needs to be Provided By: Team FYI  Barriers to Discharge: Decreased caregiver support  Decrease burden of Care through IP rehab admission: No  Possible need for SNF placement upon discharge: No  Patient Condition: This patient's condition remains as documented in the consult dated 02/15/18 at 6:06 pm, in which the Rehabilitation Physician determined and documented that the patient's condition is appropriate for intensive rehabilitative care in an inpatient rehabilitation facility. Will admit to inpatient rehab today.  Preadmission Screen Completed By:  Fae Pippin, 02/17/2018 2:07 PM ______________________________________________________________________   Discussed status with Dr. Allena Katz on 02/17/18 at 1525 and received telephone approval for admission today.  Admission Coordinator:  Fae Pippin, time 1525/Date 02/17/18             Cosigned by: Marcello Fennel, MD at 02/17/2018 3:36 PM  Revision History

## 2018-02-19 ENCOUNTER — Inpatient Hospital Stay (HOSPITAL_COMMUNITY): Payer: 59

## 2018-02-19 ENCOUNTER — Encounter (HOSPITAL_COMMUNITY): Payer: Self-pay

## 2018-02-19 NOTE — Plan of Care (Signed)
Education given on why SCDs are needed. Pt compliant with using device.

## 2018-02-19 NOTE — Progress Notes (Signed)
[place] PHYSICAL MEDICINE & REHABILITATION     PROGRESS NOTE  Subjective/Complaints:  Patient seen lying in bed this morning. She states she slept well overnight. She states she had a good first in therapies yesterday.  ROS: denies CP, SOB, nausea, vomiting diarrhea.  Objective: Vital Signs: Blood pressure 133/81, pulse 80, temperature 98.7 F (37.1 C), temperature source Oral, resp. rate 17, height 5\' 7"  (1.702 m), weight 83.6 kg (184 lb 4.9 oz), SpO2 96 %. No results found. Recent Labs    02/17/18 0452 02/18/18 0748  WBC 8.2 8.8  HGB 8.1* 9.1*  HCT 29.7* 32.3*  PLT 416* 451*   Recent Labs    02/17/18 0452 02/18/18 0748  NA 140 138  K 3.8 3.7  CL 106 105  GLUCOSE 74 93  BUN 10 10  CREATININE 0.80 0.75  CALCIUM 8.8* 9.1   CBG (last 3)  Recent Labs    02/16/18 1200 02/17/18 1157  GLUCAP 97 96    Wt Readings from Last 3 Encounters:  02/17/18 83.6 kg (184 lb 4.9 oz)  02/13/18 81.2 kg (179 lb 0.2 oz)  07/21/17 80.3 kg (177 lb)    Physical Exam:  BP 133/81 (BP Location: Right Arm)   Pulse 80   Temp 98.7 F (37.1 C) (Oral)   Resp 17   Ht 5\' 7"  (1.702 m)   Wt 83.6 kg (184 lb 4.9 oz)   SpO2 96%   BMI 28.87 kg/m  Constitutional: She appears well-developed and well-nourished. NAD. HENT: Normocephalic and atraumatic.  Eyes: EOM are normal. No discharge.  Cardiovascular: RRR. No JVD. Respiratory: No respiratory distress. clear. GI: Bowel sounds are normal. She exhibits no distension.  Musculoskeletal: She exhibits no edema or tenderness.    Neurological: She is alert and oriented Soft voice.  Left facial weakness.  Left inattention noted.  Able to follow simple one step commands without difficulty.  Motor: LUE: 0/5 proximal to distal (unchanged) LLE: HF 2+/5, distally 0/5 (unchanged) Skin: Skin is warm and dry.  Psychiatric: She has a normal mood and affect. Her behavior is normal.   Assessment/Plan: 1. Functional deficits secondary to right basal  ganglia hemorrhage which require 3+ hours per day of interdisciplinary therapy in a comprehensive inpatient rehab setting. Physiatrist is providing close team supervision and 24 hour management of active medical problems listed below. Physiatrist and rehab team continue to assess barriers to discharge/monitor patient progress toward functional and medical goals.  Function:  Bathing Bathing position   Position: Shower  Bathing parts Body parts bathed by patient: Left arm, Abdomen, Chest, Front perineal area, Right upper leg, Left upper leg Body parts bathed by helper: Buttocks, Left lower leg, Right lower leg, Back  Bathing assist Assist Level: Touching or steadying assistance(Pt > 75%)      Upper Body Dressing/Undressing Upper body dressing   What is the patient wearing?: Pull over shirt/dress     Pull over shirt/dress - Perfomed by patient: Thread/unthread right sleeve Pull over shirt/dress - Perfomed by helper: Pull shirt over trunk, Thread/unthread left sleeve, Put head through opening        Upper body assist Assist Level: Touching or steadying assistance(Pt > 75%)      Lower Body Dressing/Undressing Lower body dressing   What is the patient wearing?: Pants Underwear - Performed by patient: Thread/unthread right underwear leg Underwear - Performed by helper: Thread/unthread left underwear leg, Pull underwear up/down Pants- Performed by patient: Thread/unthread right pants leg, Pull pants up/down Pants- Performed by  helper: Thread/unthread right pants leg, Thread/unthread left pants leg, Pull pants up/down   Non-skid slipper socks- Performed by helper: Don/doff right sock, Don/doff left sock                  Lower body assist Assist for lower body dressing: Touching or steadying assistance (Pt > 75%)      Toileting Toileting     Toileting steps completed by helper: Adjust clothing prior to toileting, Performs perineal hygiene, Adjust clothing after  toileting Toileting Assistive Devices: Grab bar or rail  Toileting assist Assist level: Touching or steadying assistance (Pt.75%)   Transfers Chair/bed transfer   Chair/bed transfer method: Stand pivot Chair/bed transfer assist level: Moderate assist (Pt 50 - 74%/lift or lower) Chair/bed transfer assistive device: Armrests, Bedrails     Locomotion Ambulation     Max distance: 15 ft Assist level: Moderate assist (Pt 50 - 74%)   Wheelchair     Max wheelchair distance: 80 ft Assist Level: Touching or steadying assistance (Pt > 75%)  Cognition Comprehension Comprehension assist level: Understands complex 90% of the time/cues 10% of the time  Expression Expression assist level: Expresses complex 90% of the time/cues < 10% of the time  Social Interaction Social Interaction assist level: Interacts appropriately 90% of the time - Needs monitoring or encouragement for participation or interaction.  Problem Solving Problem solving assist level: Solves complex 90% of the time/cues < 10% of the time  Memory Memory assist level: More than reasonable amount of time     Medical Problem List and Plan: 1.  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   Continue CIR   PRAFO, WHO ordered   Fluoxetine 10 started on 4/13 2.  DVT Prophylaxis/Anticoagulation: Mechanical: Sequential compression devices, below knee Bilateral lower extremities 3. Pain Management: N/A.  4. Mood: LCSW to follow for evaluation and support.  5. Neuropsych: This patient is not fully capable of making decisions on her own behalf. 6. Skin/Wound Care:  Routine pressure relief measures.  7. Fluids/Electrolytes/Nutrition: Monitor I/Os.    BMP within acceptable range on 4/13 8. HTN: Monitor BP bid. Continue Norvasc. Titrate Prinivil upwards for tighter control.    Controlled on 4/14 9.  Acute on chronic anemia: Continue iron supplement.    Hemoglobin 9.1 on  4/13  LOS (Days) 2 A FACE TO FACE EVALUATION WAS PERFORMED  Jocelyn [name] Karis Juba 02/19/2018 7:23 AM

## 2018-02-19 NOTE — Progress Notes (Signed)
Physical Therapy Session Note  Patient Details  Name: Jocelyn [name] MRN: [id number] Date of Birth: [birth year]  Today's Date: 02/19/2018 PT Individual Time: [id number] PT Individual Time Calculation (min): 58 min   Short Term Goals: Week 1:  PT Short Term Goal 1 (Week 1): Pt will perform stand pivot transfer with min assist PT Short Term Goal 2 (Week 1): Pt will ambulate x 50 ft with min assist and LRAD PT Short Term Goal 3 (Week 1): Pt will initiate stair training for funtional strengthening  Skilled Therapeutic Interventions/Progress Updates:    Pt seated in w/c upon PT arrival, agreeable to therapy tx and denies pain. Pt transported from room>gym in w/c. Pt performed squat pivot transfer from w/c>mat with min assist, verbal cues for techniques and sequencing. Pt performed 2 x 10 sit<>stands from the mat with min assist, verbal cues for foot placement and emphasis on symmetric weightbearing, once in standing focused on lateral weightshift to the L to increase weightbearing through L LE. Pt performed pre-gait in standing without AD, mod assist, poor knee control on L LE. Trial with use of RW and L hand orthosis for pre-gait and ambulation x 18 ft, pt requiring mod assist for L knee control managing buckling/hyperextension, verbal cues for step length. Pt transported to dayroom. Pt transferred w/c<>nustep stand pivot with mod assist and used nustep x 5 minutes LEs only for reciprocal movement and L NMR. Pt transported back to room and transferred to bed stand pivot, left supine in bed with needs in reach.   Therapy Documentation Precautions:  Precautions Precautions: Fall Precaution Comments: L hemiplegia  Restrictions Weight Bearing Restrictions: No   See Function Navigator for Current Functional Status.   Therapy/Group: Individual Therapy  Cresenciano GenreEmily van Schagen, PT, DPT 02/19/2018, 7:58 AM

## 2018-02-20 ENCOUNTER — Inpatient Hospital Stay (HOSPITAL_COMMUNITY): Payer: 59

## 2018-02-20 ENCOUNTER — Inpatient Hospital Stay (HOSPITAL_COMMUNITY): Payer: 59 | Admitting: Speech Pathology

## 2018-02-20 DIAGNOSIS — I69354 Hemiplegia and hemiparesis following cerebral infarction affecting left non-dominant side: Secondary | ICD-10-CM

## 2018-02-20 MED ORDER — ACETAMINOPHEN 325 MG PO TABS: 325.0000 mg | ORAL_TABLET | ORAL | Status: AC | PRN

## 2018-02-20 NOTE — Progress Notes (Signed)
[facility] PHYSICAL MEDICINE & REHABILITATION     PROGRESS NOTE  Subjective/Complaints:   No problems overnite, have discussed rehab progress  ROS: Denies CP, SOB, nausea, vomiting diarrhea.  Objective: Vital Signs: Blood pressure 132/83, pulse 76, temperature 98.3 F (36.8 C), temperature source Oral, resp. rate 15, height 5\' 7"  (1.702 m), weight 83.6 kg (184 lb 4.9 oz), SpO2 100 %. No results found. Recent Labs    02/18/18 0748  WBC 8.8  HGB 9.1*  HCT 32.3*  PLT 451*   Recent Labs    02/18/18 0748  NA 138  K 3.7  CL 105  GLUCOSE 93  BUN 10  CREATININE 0.75  CALCIUM 9.1   CBG (last 3)  Recent Labs    02/17/18 1157  GLUCAP 96    Wt Readings from Last 3 Encounters:  02/17/18 83.6 kg (184 lb 4.9 oz)  02/13/18 81.2 kg (179 lb 0.2 oz)  07/21/17 80.3 kg (177 lb)    Physical Exam:  BP 132/83 (BP Location: Right Arm)   Pulse 76   Temp 98.3 F (36.8 C) (Oral)   Resp 15   Ht 5\' 7"  (1.702 m)   Wt 83.6 kg (184 lb 4.9 oz)   SpO2 100%   BMI 28.87 kg/m  Constitutional: She appears well-developed and well-nourished. NAD. HENT: Normocephalic and atraumatic.  Eyes: EOM are normal. No discharge.  Cardiovascular: Normal rate and regular rhythm. No JVD. Respiratory: No respiratory distress. Clear. GI: Bowel sounds are normal. She exhibits no distension.  Musculoskeletal: She exhibits no edema or tenderness.    Neurological: She is alert and oriented Soft voice.  Left facial weakness.  Left inattention noted.  Able to follow simple one step commands without difficulty.  Motor: LUE: 0/5 proximal to distal LLE: HF 2+/5, distally 0/5 Skin: Skin is warm and dry.  Psychiatric: She has a normal mood and affect. Her behavior is normal.   Assessment/Plan: 1. 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 Physiatrist is providing close team supervision  and 24 hour management of active medical problems listed below. Physiatrist and rehab team continue to assess barriers to discharge/monitor patient progress toward functional and medical goals.  Function:  Bathing Bathing position   Position: Shower  Bathing parts Body parts bathed by patient: Left arm, Abdomen, Chest, Front perineal area, Right upper leg, Left upper leg Body parts bathed by helper: Buttocks, Left lower leg, Right lower leg, Back  Bathing assist Assist Level: Touching or steadying assistance(Pt > 75%)      Upper Body Dressing/Undressing Upper body dressing   What is the patient wearing?: Pull over shirt/dress     Pull over shirt/dress - Perfomed by patient: Thread/unthread right sleeve Pull over shirt/dress - Perfomed by helper: Pull shirt over trunk, Thread/unthread left sleeve, Put head through opening        Upper body assist Assist Level: Touching or steadying assistance(Pt > 75%)      Lower Body Dressing/Undressing Lower body dressing   What is the patient wearing?: Pants Underwear - Performed by patient: Thread/unthread right underwear leg Underwear - Performed by helper: Thread/unthread left underwear leg, Pull underwear up/down Pants- Performed by patient: Thread/unthread right pants leg, Pull pants up/down Pants- Performed by helper: Thread/unthread right pants leg, Thread/unthread left pants leg, Pull pants up/down   Non-skid slipper socks- Performed by helper: Don/doff right sock, Don/doff left sock  Lower body assist Assist for lower body dressing: Touching or steadying assistance (Pt > 75%)      Toileting Toileting     Toileting steps completed by helper: Adjust clothing prior to toileting, Performs perineal hygiene, Adjust clothing after toileting Toileting Assistive Devices: Grab bar or rail  Toileting assist Assist level: Touching or steadying assistance (Pt.75%)   Transfers Chair/bed transfer   Chair/bed transfer  method: Stand pivot Chair/bed transfer assist level: Moderate assist (Pt 50 - 74%/lift or lower) Chair/bed transfer assistive device: Armrests, Bedrails     Locomotion Ambulation     Max distance: 18 ft Assist level: Moderate assist (Pt 50 - 74%)   Wheelchair     Max wheelchair distance: 80 ft Assist Level: Touching or steadying assistance (Pt > 75%)  Cognition Comprehension Comprehension assist level: Understands complex 90% of the time/cues 10% of the time  Expression Expression assist level: Expresses complex 90% of the time/cues < 10% of the time  Social Interaction Social Interaction assist level: Interacts appropriately 90% of the time - Needs monitoring or encouragement for participation or interaction.  Problem Solving Problem solving assist level: Solves complex 90% of the time/cues < 10% of the time  Memory Memory assist level: More than reasonable amount of time     Medical Problem List and Plan: 1.  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   CIR PT, OT, SLP 2.  DVT Prophylaxis/Anticoagulation: Mechanical: Sequential compression devices, below knee Bilateral lower extremities 3. Pain Management: N/A.  4. Mood: LCSW to follow for evaluation and support.  5. Neuropsych: This patient is not fully capable of making decisions on her own behalf. 6. Skin/Wound Care:  Routine pressure relief measures.  7. Fluids/Electrolytes/Nutrition: Monitor I/Os.    600ml intake, encourage         8. HTN: Monitor BP bid. Continue Norvasc. Titrate Prinivil upwards for tighter control.     Vitals:   02/19/18 2120 02/20/18 0415  BP: 132/78 132/83  Pulse:  76  Resp:  15  Temp:  98.3 F (36.8 C)  SpO2:  100%   9.  Acute on chronic anemia: Continue iron supplement.    Hemoglobin 9.1 on 4/13 Check stool guaic  LOS (Days) 3 A FACE TO FACE EVALUATION WAS PERFORMED  Erick Colacendrew E Kirsteins 02/20/2018 7:26 AM

## 2018-02-20 NOTE — Progress Notes (Signed)
Occupational Therapy Session Note  Patient Details  Name: Jocelyn [name] MRN: [id number] Date of Birth: February 26, 1966  Today's Date: 02/20/2018 OT Individual Time: 5929-2446 OT Individual Time Calculation (min): 59 min    Short Term Goals: Week 1:  OT Short Term Goal 1 (Week 1): Pt will recall hemi-dressing techniques 2 consecutive days with min questioning cues OT Short Term Goal 2 (Week 1): Pt will complete 1/4 steps of toileting task OT Short Term Goal 3 (Week 1): Pt will maintain static standing balance for 1 minutes with min A at RW/ sink in prep for functional task  Skilled Therapeutic Interventions/Progress Updates:    Pt received sitting up in w/c agreeable to therapy. Pt brought down to therapy gym and transferred to mat via stand pivot and mod A. Vc provided for UE placement/LE sequencing during transfers. Pt used 1lb ball to complete forward functional reaching bimanually, requiring manual facilitation to maintain L UE on ball while sitting unsupported with no LOB. Pt then completed functional reaching task with trunk rotation to challenge dynamic sitting balance and reaching outside of BOS. Pt completed sit to stand transfer back to w/c toward the R with min A. Pt self-propelled w/c with her R UE and LE 19f , requiring vc for obstacle negotiation and motivation to continue. Pt was returned to supine in bed with bed alarm activated and all needs met.   1:1 NMES applied to biceps and triceps to increase NMR and voluntary movement at elbow flex/ext during ADLs.   Ratio 1:3 Rate 35 pps Waveform- Asymmetric Ramp 1.0 Pulse 300 Intensity- 8.5 biceps, 5.0 triceps Duration -   10 min  Report of pain at the beginning of session: 0 Report of pain at the end of session: 0  No adverse reactions after treatment and is skin intact.   Therapy Documentation Precautions:  Precautions Precautions: Fall Precaution Comments: L hemiplegia  Restrictions Weight Bearing Restrictions:  No    Pain: Pain Assessment Pain Scale: 0-10 Pain Score: 0-No pain ADL: ADL ADL Comments: Please see functional navigator  See Function Navigator for Current Functional Status.   Therapy/Group: Individual Therapy  SCurtis Sites4/15/2019, 10:59 AM

## 2018-02-20 NOTE — IPOC Note (Addendum)
Overall Plan of Care [facility](IPOC) Patient Details Name: Jocelyn [name] MRN: [id number] DOB: 1965/12/01  Admitting Diagnosis: <principal problem not specified>  Hospital Problems: Active Problems:   ICH (intracerebral hemorrhage) (HCC) - R basal ganglia   Acute blood loss anemia   Flaccid monoplegia of upper extremity (HCC)     Functional Problem List: Nursing Motor, Sensory, Bowel  PT Balance, Behavior, Endurance, Motor, Pain, Safety, Sensory  OT Balance, Endurance, Motor, Safety  SLP Cognition(supervision A for semi-complex problem solving)  TR         Basic ADL's: OT Eating, Grooming, Bathing, Dressing, Toileting     Advanced  ADL's: OT       Transfers: PT Bed Mobility, Bed to Chair, Car, State Street CorporationFurniture, Civil Service fast streamerloor  OT Toilet, Research scientist (life sciences)Tub/Shower     Locomotion: PT Ambulation, Psychologist, prison and probation servicesWheelchair Mobility, Stairs     Additional Impairments: OT Fuctional Use of Upper Extremity  SLP Communication, Social Cognition expression Problem Solving(semi-complex)  TR      Anticipated Outcomes Item Anticipated Outcome  Self Feeding Supervision  Dispensing opticianwallowing      Basic self-care  Supervision  Toileting  Supervision   Bathroom Transfers Supervision  Bowel/Bladder  Pt will remain continent of bowel  Transfers  supervision  Locomotion  supervision  Communication  Mod I  Cognition  Mod I  Pain  Pt will maintain pain at desired manageable level  Safety/Judgment  Pt will use call bell to call for assistance before transferring    Therapy Plan: PT Intensity: Minimum of 1-2 x/day ,45 to 90 minutes PT Frequency: 5 out of 7 days PT Duration Estimated Length of Stay: 2.5 weeks OT Intensity: Minimum of 1-2 x/day, 45 to 90 minutes OT Frequency: 5 out of 7 days OT Duration/Estimated Length of Stay: 2.5-3 weeks SLP Intensity: Minumum of 1-2 x/day, 30 to 90 minutes SLP Frequency: 1 to 3 out of 7 days SLP Duration/Estimated Length of Stay: 18-22 days ( much shorter for ST)    Team  Interventions: Nursing Interventions Patient/Family Education, Discharge Planning, Psychosocial Support  PT interventions Ambulation/gait training, Therapeutic Activities, Functional mobility training, Discharge planning, Warden/rangerBalance/vestibular training, Disease management/prevention, Neuromuscular re-education, Therapeutic Exercise, Wheelchair propulsion/positioning, Cognitive remediation/compensation, DME/adaptive equipment instruction, Splinting/orthotics, UE/LE Strength taining/ROM, FirefighterCommunity reintegration, Development worker, international aidunctional electrical stimulation, Equities traderatient/family education, Museum/gallery curatortair training, UE/LE Coordination activities  OT Interventions Warden/rangerBalance/vestibular training, Cognitive remediation/compensation, FirefighterCommunity reintegration, Discharge planning, DME/adaptive equipment instruction, Functional electrical stimulation, Functional mobility training, Neuromuscular re-education, Self Care/advanced ADL retraining, Patient/family education, Psychosocial support, Splinting/orthotics, Therapeutic Activities, Therapeutic Exercise, UE/LE Strength taining/ROM, UE/LE Coordination activities, Wheelchair propulsion/positioning  SLP Interventions Cueing hierarchy, Cognitive remediation/compensation, Functional tasks, Speech/Language facilitation  TR Interventions    SW/CM Interventions  Psychosocial Assessment, Pt & Family Education & Discharge Planning   Barriers to Discharge MD  Medical stability  Nursing Medical stability    PT Decreased caregiver support husband works full time  OT      SLP Decreased caregiver support    SW       Team Discharge Planning: Destination: PT-Home ,OT- Home , SLP-Home Projected Follow-up: PT-Home health PT, OT-  Home health OT, SLP-None Projected Equipment Needs: PT-To be determined, OT- To be determined, SLP-None recommended by SLP Equipment Details: PT- , OT-  Patient/family involved in discharge planning: PT- Patient, Family member/caregiver,  OT-Patient, SLP-Patient, Family  member/caregiver  MD ELOS: 18-22d Medical Rehab Prognosis:  Good Assessment:  52 year old female with history of untreated hypertension, tobacco use and anemia who was admitted on 02/14/2015 after found by family with left-sided weakness left facial droop  and slurred speech. Last seen normal night before.   History taken from chart review and patient. CT of head done showed right basal ganglia hemorrhage with mild edema and leftward shift. Blood pressure 148/101 and she was started on clevidipine as well as hypertonic saline for edema control. CTA head/neck done revealing patent carotid and vertebral arteries, no large vessel occlusion, aneurysm, AVM  or significant stenosis and stable bleed.  MBS done and patient started on regular diet. Blood pressures continue to be labile and medication have been titrated for better control. She has been weaned off hypertonic saline and medically stable. Patient with resultant receptive aphasia, left sided weakness and cognitive deficits   Now requiring 24/7 Rehab RN,MD, as well as CIR level PT, OT and SLP.  Treatment team will focus on ADLs and mobility with goals set at Sup   See Team Conference Notes for weekly updates to the plan of care

## 2018-02-20 NOTE — Progress Notes (Signed)
Speech Language Pathology Daily Session Note  Patient Details  Name: Jocelyn [name] MRN: [id number] Date of Birth: 07/18/1966  Today's Date: 02/20/2018 SLP Individual Time: 0730-0830 SLP Individual Time Calculation (min): 60 min  Short Term Goals: Week 1: SLP Short Term Goal 1 (Week 1): Pt will demonstrate semi-complex problem solving in functional tasks at Mod I. SLP Short Term Goal 2 (Week 1): Pt will utilize speech intellgibility strategies in conversation for 90% intelligibility at Mod I.  Skilled Therapeutic Interventions: Skilled treatment session focused on cognition goals. SLP received pt in bathroom, pt demonstrates good safety awareness and attention to task while toileting. Pt able to divide attention during transfer to nursing and transfer as well as comprehend semi-complex directions for donning pants. Pt able to problem solve tray set-up in setting of left UE weakness. SLP provided supervision questions to complete complex problem solving tasks and to identify activities that are safe to participate in presently. Pt's speech was intelligible throughout. Pt was returned to room, left upright in wheelchair with all needs within reach. Follow up x 1 and then discharge from ST services if appropriate.      Function:  Eating Eating   Modified Consistency Diet: No Eating Assist Level: No help, No cues           Cognition Comprehension Comprehension assist level: Follows complex conversation/direction with extra time/assistive device  Expression   Expression assist level: Expresses complex ideas: With extra time/assistive device  Social Interaction Social Interaction assist level: Interacts appropriately with others with medication or extra time (anti-anxiety, antidepressant).  Problem Solving Problem solving assist level: Solves complex problems: With extra time  Memory Memory assist level: Complete Independence: No helper    Pain Pain Assessment Pain Scale:  0-10 Pain Score: 0-No pain  Therapy/Group: Individual Therapy  Jodean Valade 02/20/2018, 9:13 AM

## 2018-02-20 NOTE — Progress Notes (Signed)
Physical Therapy Session Note  Patient Details  Name: Jocelyn [name] MRN: [id number] Date of Birth: 05-Jul-1966  Today's Date: 02/20/2018 PT Individual Time: 1191-47821303-1415 PT Individual Time Calculation (min): 72 min   Short Term Goals: Week 1:  PT Short Term Goal 1 (Week 1): Pt will perform stand pivot transfer with min assist PT Short Term Goal 2 (Week 1): Pt will ambulate x 50 ft with min assist and LRAD PT Short Term Goal 3 (Week 1): Pt will initiate stair training for funtional strengthening  Skilled Therapeutic Interventions/Progress Updates:    Pt supine in bed upon PT arrival, agreeable to therapy tx and denies pain. Pt transferred from supine>sitting EOB with supervision, verbal cues to move L LE. Pt transferred from bed>w/c stand pivot with mod assist, verbal cues for techniques. Pt transported to gym. Pt ambulated x 22 ft with RW and mod assist, L hand orthosis and L ACE wrap for DF, verbal cues for L knee control during stance and for increased R step length. Pt seated edge of mat performed 2 x 5 sit<>stands with 2 inch step under R LE for increased loading/weightbearing through L LE, mod assist. Pt standing with RW performed 2 x 10 lateral step ups with L LE on 2 inch step for neuro re-ed and L knee control. Pt transferred to w/c with min assist stand pivot. Pt ascended/descended 4 steps with R handrail and mod assist, therapist blocking L knee throughout to prevent buckling, step to pattern with verbal cues for techniques. Pt transferred w/c<>nustep mod assist stand pivot and performed x 3 minutes LEs only, x 3 minutes B LEs/UEs with L hand grip, workload 4. Pt transported back to room and transferred w/c<>toilet with mod assist stand pivot, LOB to the L when transferring to the toilet. Pt requiring mod assist for standing balance while performing clothing management. Pt transferred w/c>bed min assist stand pivot, left supine in bed with needs in reach and bed alarm set.   Therapy  Documentation Precautions:  Precautions Precautions: Fall Precaution Comments: L hemiplegia  Restrictions Weight Bearing Restrictions: No   See Function Navigator for Current Functional Status.   Therapy/Group: Individual Therapy  Cresenciano GenreEmily van Schagen, PT, DPT 02/20/2018, 7:52 AM

## 2018-02-20 NOTE — Care Management Note (Signed)
[facility] Individual Statement of Services  Patient Name:  Jocelyn [name]  Date:  02/20/2018  Welcome to the [facility].  Our goal is to provide you with an individualized program based on your diagnosis and situation, designed to meet your specific needs.  With this comprehensive rehabilitation program, you will be expected to participate in at least 3 hours of rehabilitation therapies Monday-Friday, with modified therapy programming on the weekends.  Your rehabilitation program will include the following services:  Physical Therapy (PT), Occupational Therapy (OT), Speech Therapy (ST), 24 hour per day rehabilitation nursing, Therapeutic Recreaction (TR), Neuropsychology, Case Management (Social Worker), Rehabilitation Medicine, Nutrition Services and Pharmacy Services  Weekly team conferences will be held on Wednesday to discuss your progress.  Your Social Worker will talk with you frequently to get your input and to update you on team discussions.  Team conferences with you and your family in attendance may also be held.  Expected length of stay: 2.5-3 weeks   Overall anticipated outcome: supervision with set up  Depending on your progress and recovery, your program may change. Your Social Worker will coordinate services and will keep you informed of any changes. Your Social Worker's name and contact numbers are listed  below.  The following services may also be recommended but are not provided by the [facility]patient [facility]:   Driving Evaluations  Home Health Rehabiltiation Services  Outpatient Rehabilitation Services  Vocational Rehabilitation   Arrangements will be made to provide these services after discharge if needed.  Arrangements include referral to agencies that provide these services.  Your insurance has been verified to be:  [employer]-Medicare Your primary doctor is:    Pertinent information will be shared with your doctor  and your insurance company.  Social Worker:  Dossie DerBecky Lemon [place], SW [phone] or ([phone]) [phone]  Information discussed with and copy given to patient by: Lucy Chrisupree, Christean Silvestri G, 02/20/2018, 10:21 AM

## 2018-02-20 NOTE — Progress Notes (Signed)
Social Work  Social Work Assessment and Plan  Patient Details  Name: Jocelyn [name] MRN: [id number] Date of Birth: 1966/05/31  Today's Date: 02/20/2018  Problem List:  Patient Active Problem List   Diagnosis Date Noted  . Acute blood loss anemia   . Flaccid monoplegia of upper extremity (HCC)   . Hyperlipemia 02/16/2018  . Cerebral edema (HCC) 02/16/2018  . Benign essential HTN   . Anemia of chronic disease   . Amphetamine abuse (HCC)   . Hypokalemia   . 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 Pneumonia, community acquired 09/23/2015  . Microcytic hypochromic anemia 09/23/2015  . Tobacco abuse 09/23/2015  . Pneumonia 09/23/2015  . Nicotine addiction 12/09/2013  . Microcytic anemia 12/09/2013  . HTN (hypertension) 12/09/2013  . BMI 36.0-36.9,adult 12/09/2013   Past Medical History:  Past Medical History:  Diagnosis Date  . Anemia   . Hypertension   . ICH (intracerebral hemorrhage) (HCC) 02/2018   Past Surgical History:  Past Surgical History:  Procedure Laterality Date  . MYOMECTOMY     Social History:  reports that she has quit smoking. Her smoking use included cigarettes. She has a 2.00 pack-year smoking history. She has never used smokeless tobacco. She reports that she drinks alcohol. She reports that she does not use drugs.  Family / Support Systems Marital Status: Separated Patient Roles: Spouse, Other (Comment)(sister) Spouse/Significant Other: Ronnell [phone] Other Supports: United ParcelMalissa Tyson-sister [phone]  Shona Needlesatricia Dubreuil-friend [phone] Anticipated Caregiver: Husband to take FMLA or work something out Ability/Limitations of Caregiver: Ronnell works 7-3pm Medical laboratory scientific officerCaregiver Availability: Other (Comment)(Will work 24 hr care plan) Family Dynamics: Close knit with sister and friend, she and husband were separated but due to this are getting back together,according to pt. She feels she needs to get herself together and  work on herself.  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 Pt is improving daily and getting better, may be able to make her own decisions by the time she is discharged.   Abuse/Neglect Abuse/Neglect Assessment Can Be Completed: Yes Physical Abuse: Denies Verbal Abuse: Denies Sexual Abuse: Denies Exploitation of patient/patient's resources: Denies Self-Neglect: Denies  Emotional Status Pt's affect, behavior adn adjustment status: Pt is motivated to do well and recover from this stroke. She is surprised this happened and is positive about her recovering and progress thus far. She feels she needs to be better about taking care of herself and taking her medicine like she should. Recent Psychosocial Issues: other health issues and separation Pyschiatric History: No history due to multiple stressors feels she would benefit from neuro-psych seeing while here. Will make referral. Substance Abuse History: Tobacco pt is aware of the recommendation to quit smoking and plans to do this now  Patient / Family Perceptions, Expectations & Goals Pt/Family understanding of illness & functional limitations: Pt is able to explain her stroke and deficits. She has made progress and this is encouraging to her. She does talk with the MD and feels she knows what her treatment plan is going forward.  Premorbid pt/family roles/activities: Sister, wife, employee, friend, etc Anticipated changes  in roles/activities/participation: resume Pt/family  expectations/goals: Pt states: " I want to be able to take care of myself before I leave here."  " I always have."  Friend states: " She will work hard and get there."  [employer]: None Premorbid Home Care/DME Agencies: None Transportation available at discharge: Husband, friend, sister Resource referrals recommended: Neuropsychology, Support group (specify)  Discharge Planning Living Arrangements: Alone Support Systems: Spouse/significant other, Other relatives, Friends/neighbors Type of Residence: Private residence Insurance Resources: Media planner (specify)(UHC) Financial Resources: Employment Financial Screen Referred: No Living Expenses: Psychologist, sport and exercise Management: Patient Does the patient have any problems obtaining your medications?: No Home Management: Self will have help now Patient/Family Preliminary Plans: Plans to be with her husband now he will be assisting her, along with her sister and friend. All aware may need to come up with a 24 hr supervision care plan for discharge. Will await team conference and work on the plan. Sw Barriers to Discharge: Decreased caregiver support Sw Barriers to Discharge Comments: Husband works may take [employer] Social Work Anticipated Follow Up Needs: HH/OP, Support Group  Clinical Impression Pleasant female who is motivated to recover from her stroke. She seems to be doing well. She is unsure where the guardian in her chart came from, along with her friend. She is back with her husband now which she was separated from. Will make neuro-psych referral due to with her stress feels she would benefit from. Work on discharge needs.  Lucy Chris 02/20/2018, 1:27 PM

## 2018-02-21 ENCOUNTER — Encounter (HOSPITAL_COMMUNITY): Payer: 59 | Admitting: Psychology

## 2018-02-21 ENCOUNTER — Inpatient Hospital Stay (HOSPITAL_COMMUNITY): Payer: 59

## 2018-02-21 ENCOUNTER — Inpatient Hospital Stay (HOSPITAL_COMMUNITY): Payer: 59 | Admitting: Physical Therapy

## 2018-02-21 NOTE — Progress Notes (Signed)
Occupational Therapy Session Note  Patient Details  Name: Jocelyn [name] MRN: [id number] Date of Birth: [birth year]  Today's Date: 02/21/2018 OT Individual Time: [id number] OT Individual Time Calculation (min): 66 min    Short Term Goals: Week 1:  OT Short Term Goal 1 (Week 1): Pt will recall hemi-dressing techniques 2 consecutive days with min questioning cues OT Short Term Goal 2 (Week 1): Pt will complete 1/4 steps of toileting task OT Short Term Goal 3 (Week 1): Pt will maintain static standing balance for 1 minutes with min A at RW/ sink in prep for functional task  Skilled Therapeutic Interventions/Progress Updates:    Pt received supine in bed agreeable to therapy. Session focused on L UE NMR during b/d, ADL transfers, and standing balance during toileting tasks/LB dressing. Vc provided for LUE management/positioning during bed mobility. Vc for sequencing and manual facilitation for LUE provided during all ADL transfers. Hemi-bathing techniques demonstrated re washing under R UE and discussion re use of AE, to trial in future (I.e long handled sponge). Recommendation provided re AE use in the home following d/c, including types of shower chairs and indications. Vc provided for body mechanics during distal LB bathing at seated shower level. Vc provided for figure 4 dressing technique at w/c level, with pt unable to bring L UE to figure 4 and requiring min A to thread underwear over L UE. Manual facilitation provided for positioning L UE during standing level toileting tasks and LB dressing, and vc to increase BOS distally to maximize static standing balance. Pt presenting with difficulty weight shifting over L LE, with knee buckling 2x. Increased time required for pt toileting during session. Pt left in w/c with quick release belt donned, brushing teeth at sink.   Therapy Documentation Precautions:  Precautions Precautions: Fall Precaution Comments: L hemiplegia  Restrictions Weight  Bearing Restrictions: No   Vital Signs: Therapy Vitals Temp: 98.1 F (36.7 C) Temp Source: Oral Pulse Rate: 72 Resp: 18 BP: 124/73 Patient Position (if appropriate): Lying Oxygen Therapy SpO2: 100 % O2 Device: Room Air Pain: Pain Assessment Pain Scale: 0-10 Pain Score: 0-No pain ADL: ADL ADL Comments: Please see functional navigator  See Function Navigator for Current Functional Status.   Therapy/Group: Individual Therapy  Crissie ReeseSandra H Paralee Pendergrass 02/21/2018, 9:44 AM

## 2018-02-21 NOTE — Progress Notes (Signed)
Physical Therapy Session Note  Patient Details  Name: Jocelyn [name] MRN: [id number] Date of Birth: 01/14/1966  Today's Date: 02/21/2018 PT Individual Time: 1300-1410 PT Individual Time Calculation (min): 70 min   Short Term Goals: Week 1:  PT Short Term Goal 1 (Week 1): Pt will perform stand pivot transfer with min assist PT Short Term Goal 2 (Week 1): Pt will ambulate x 50 ft with min assist and LRAD PT Short Term Goal 3 (Week 1): Pt will initiate stair training for funtional strengthening  Skilled Therapeutic Interventions/Progress Updates:   Pt in supine and agreeable to therapy, denies pain. Requesting to toilet. Performed transfer to w/c and to/from toilet w/ mod assist for LLE management w/ pivoting and verbal cues for technique. Pt performed pericare w/ set-up assist. Total assist w/c transport to/from gym. Session focused on LLE NMR w/ emphasis on eccentric knee control. Ambulated 30' w/ min-mod assist overall to facilitate lateral weight shifting and occasional manual assist to keep LLE from buckling. Performed multiple sit<>stands from mat w/ min assist w/ increasing emphasis on using L side to power up. Manual assist to control L knee flexion/extension. Performed lateral weight shifting, mini-squats, and staggered stance w/ forward weight shift to LLE; working on controlling L knee movement. Performed NuStep 5 min @ level 1 for LE reciprocal movement pattern w/ manual assist to control neutral LLE alignment. Returned to room and ended session in supine, call bell within reach and all needs met. Reminded pt to ask husband to bring in different shoes to trial LAFO.   Therapy Documentation Precautions:  Precautions Precautions: Fall Precaution Comments: L hemiplegia  Restrictions Weight Bearing Restrictions: No  See Function Navigator for Current Functional Status.   Therapy/Group: Individual Therapy  Amy K Arnette 02/21/2018, 2:13 PM  

## 2018-02-21 NOTE — Consult Note (Signed)
Neuropsychological Consultation   Patient:   Jocelyn [name]   DOB:   1966-03-15  MR Number:  [id number]  [id number] [address] [id number] [place] [address] Dept: [phone] Loc: [phone]           Date of Service:   02/21/2018  Start Time:   2:00 PM End Time:   3 PM  Provider/Observer:  Arley PhenixJohn Rodenbough, Psy.D.       Clinical Neuropsychologist       Billing Code/Service: [phone] 4 Units  Chief Complaint:    Jocelyn PoetMickie R [name] is a 52 year old female with prior history of hypertension with recent discontinue of BP meds, and anemia.  Admitted on 02/13/2018 after family observed left-sided weakness, left facial droop and slurred speech.  CT showed right basal ganglia hemorrhage with mild edema and leftward shift.  Patient with continued motor deficits with left hand and some weakness of left leg.    Reason for Service:  Harvest ForestMickie [name] was referred for neuropsychogical consultation due to coping and adjustment issues.  Below is the HPI for the current admission.  HPI:  Jocelyn PoetMickie R Albin is a [age] year old female with history of untreated hypertension, tobacco use and anemia who was admitted on 02/13/2018 after found by family with left-sided weakness left facial droop and slurred speech. Last seen normal night before.   History taken from chart review and patient. CT of head done showed right basal ganglia hemorrhage with mild edema and leftward shift. Blood pressure 148/101 and she was started on clevidipine as well as hypertonic saline for edema control. CTA head/neck done revealing patent carotid and vertebral arteries, no large vessel occlusion, aneurysm, AVM  or significant stenosis and stable bleed.  MBS done and patient started on regular diet. Blood pressures continue to be labile and medication have been titrated for better control. She has been weaned off hypertonic saline and medically  stable. Patient with resultant receptive aphasia, left sided weakness and cognitive deficits. Therapy ongoing and CIR recommended due to functional deficits.   Current Status:  The patient reports that she had been under stress prior to CVA.  She reports that she and husband had separated.  They are now trying to get back together, which started after CVA.  Patient was lethargic but oriented x4.  Attention good except times of somnolence.    Behavioral Observation: Jocelyn PoetMickie R Donoso  presents as a 52 y.o.-year-old Right African American Female who appeared her stated age. her dress was Appropriate and she was Well Groomed and her manners were Appropriate to the situation.  her participation was indicative of Appropriate and Drowsy behaviors.  There were physical disabilities noted with most issues on left side and left arm/hand.  she displayed an appropriate level of cooperation and motivation.     Interactions:    Active Appropriate and Drowsy  Attention:   within normal limits and attention span and concentration were age appropriate  Memory:   within normal limits; recent and remote memory intact  Visuo-spatial:  not examined  Speech (Volume):  low  Speech:   normal; normal  Thought Process:  Coherent and Relevant  Though Content:  WNL; not suicidal and not homicidal  Orientation:   person, place, time/date and situation  Judgment:   Good  Planning:   Good  Affect:    Blunted  Mood:    Dysphoric  Insight:   Good  Intelligence:  normal  Marital Status/Living: Patient was separated at time of CVA.  She and her husband have been trying to get back together but still working on marital issues.  Medical History:   Past Medical History:  Diagnosis Date  . Anemia   . Hypertension   . ICH (intracerebral hemorrhage) (HCC) 02/2018   Psychiatric History:  Patient denied prior history of depression/anxiety or other psychiatric issues.  Family Med/Psych History:  Family  History  Problem Relation Age of Onset  . Diabetes Mellitus II Neg Hx   . Hypertension Neg Hx     Risk of Suicide/Violence: virtually non-existent Patient denies SI or HI.  Impression/DX:  Jocelyn [name] is a 52 year old female with prior history of hypertension with recent discontinue of BP meds, and anemia.  Admitted on 02/13/2018 after family observed left-sided weakness, left facial droop and slurred speech.  CT showed right basal ganglia hemorrhage with mild edema and leftward shift.  Patient with continued motor deficits with left hand and some weakness of left leg.    The patient reports that she had been under stress prior to CVA.  She reports that she and husband had separated.  They are now trying to get back together, which started after CVA.  Patient was lethargic but oriented x4.  Attention good except times of somnolence.   Diagnosis:    Nontraumatic subcortical hemorrhage of right cerebral hemisphere [facility]) - Plan: Ambulatory referral to Physical Medicine Rehab         Electronically Signed   _______________________ Arley Phenix, Psy.D.

## 2018-02-21 NOTE — Progress Notes (Signed)
[facility] PHYSICAL MEDICINE & REHABILITATION     PROGRESS NOTE  Subjective/Complaints:   No issues overnite , no bowel or bladder issues per pt, cont Bowel and bladder  ROS: Denies CP, SOB, nausea, vomiting diarrhea.  Objective: Vital Signs: Blood pressure 124/77, pulse 67, temperature 98.5 F (36.9 C), temperature source Oral, resp. rate 17, height 5\' 7"  (1.702 m), weight 83.6 kg (184 lb 4.9 oz), SpO2 99 %. No results found. Recent Labs    02/18/18 0748  WBC 8.8  HGB 9.1*  HCT 32.3*  PLT 451*   Recent Labs    02/18/18 0748  NA 138  K 3.7  CL 105  GLUCOSE 93  BUN 10  CREATININE 0.75  CALCIUM 9.1   CBG (last 3)  No results for input(s): GLUCAP in the last 72 hours.  Wt Readings from Last 3 Encounters:  02/17/18 83.6 kg (184 lb 4.9 oz)  02/13/18 81.2 kg (179 lb 0.2 oz)  07/21/17 80.3 kg (177 lb)    Physical Exam:  BP 124/77 (BP Location: Right Arm)   Pulse 67   Temp 98.5 F (36.9 C) (Oral)   Resp 17   Ht 5\' 7"  (1.702 m)   Wt 83.6 kg (184 lb 4.9 oz)   SpO2 99%   BMI 28.87 kg/m  Constitutional: She appears well-developed and well-nourished. NAD. HENT: Normocephalic and atraumatic.  Eyes: EOM are normal. No discharge.  Cardiovascular: Normal rate and regular rhythm. No JVD. Respiratory: No respiratory distress. Clear. GI: Bowel sounds are normal. She exhibits no distension.  Musculoskeletal: She exhibits no edema or tenderness.    Neurological: She is alert and oriented Soft voice.  Left facial weakness.  Left inattention noted.  Able to follow simple one step commands without difficulty.  Motor: LUE: 0/5 proximal to distal LLE: HF 2+/5, distally 0/5 Skin: Skin is warm and dry.  Psychiatric: She has a normal mood and affect. Her behavior is normal.   Assessment/Plan: 1. 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 Physiatrist is  providing close team supervision and 24 hour management of active medical problems listed below. Physiatrist and rehab team continue to assess barriers to discharge/monitor patient progress toward functional and medical goals.  Function:  Bathing Bathing position Bathing activity did not occur: N/A Position: Shower  Bathing parts Body parts bathed by patient: Left arm, Abdomen, Chest, Front perineal area, Right upper leg, Left upper leg Body parts bathed by helper: Buttocks, Left lower leg, Right lower leg, Back  Bathing assist Assist Level: Touching or steadying assistance(Pt > 75%)      Upper Body Dressing/Undressing Upper body dressing   What is the patient wearing?: Pull over shirt/dress     Pull over shirt/dress - Perfomed by patient: Thread/unthread right sleeve Pull over shirt/dress - Perfomed by helper: Pull shirt over trunk, Thread/unthread left sleeve, Put head through opening        Upper body assist Assist Level: Touching or steadying assistance(Pt > 75%)      Lower Body Dressing/Undressing Lower body dressing   What is the patient wearing?: Underwear, Pants Underwear - Performed by patient: Thread/unthread right underwear leg Underwear - Performed by helper: Thread/unthread left underwear leg, Pull underwear up/down Pants- Performed by patient: Pull pants up/down Pants- Performed by helper: Thread/unthread right pants leg, Thread/unthread left pants leg, Pull pants up/down   Non-skid slipper socks- Performed by helper: Don/doff right sock, Don/doff  left sock                  Lower body assist Assist for lower body dressing: Touching or steadying assistance (Pt > 75%)      Toileting Toileting Toileting activity did not occur: No continent bowel/bladder event Toileting steps completed by patient: Performs perineal hygiene Toileting steps completed by helper: Adjust clothing prior to toileting, Adjust clothing after toileting Toileting Assistive Devices:  Grab bar or rail  Toileting assist Assist level: Touching or steadying assistance (Pt.75%)   Transfers Chair/bed transfer   Chair/bed transfer method: Stand pivot Chair/bed transfer assist level: Moderate assist (Pt 50 - 74%/lift or lower) Chair/bed transfer assistive device: Armrests, Bedrails     Locomotion Ambulation     Max distance: 30 ft Assist level: Moderate assist (Pt 50 - 74%)   Wheelchair     Max wheelchair distance: 19900ft Assist Level: Supervision or verbal cues  Cognition Comprehension Comprehension assist level: Follows complex conversation/direction with extra time/assistive device  Expression Expression assist level: Expresses complex ideas: With extra time/assistive device  Social Interaction Social Interaction assist level: Interacts appropriately with others with medication or extra time (anti-anxiety, antidepressant).  Problem Solving Problem solving assist level: Solves complex problems: With extra time  Memory Memory assist level: Complete Independence: No helper     Medical Problem List and Plan: 1.  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   CIR PT, OT, SLP team conf in am 2.  DVT Prophylaxis/Anticoagulation: Mechanical: Sequential compression devices, below knee Bilateral lower extremities 3. Pain Management: N/A.  4. Mood: LCSW to follow for evaluation and support.  5. Neuropsych: This patient is not fully capable of making decisions on her own behalf. 6. Skin/Wound Care:  Routine pressure relief measures.  7. Fluids/Electrolytes/Nutrition: Monitor I/Os.    360ml intake, encourage         8. HTN: Monitor BP bid. Continue Norvasc. Titrate Prinivil upwards for tighter control.     Vitals:   02/20/18 1847 02/21/18 0703  BP: 120/78 124/77  Pulse: 75 67  Resp: 20 17  Temp: 98.7 F (37.1 C) 98.5 F (36.9 C)  SpO2: 100% 99%  controlled 4/16 9.  Acute on chronic anemia: Continue iron  supplement.    Hemoglobin 9.1 on 4/13 Check stool guaic, last recorded stool guaic 2016 neg  LOS (Days) 4 A FACE TO FACE EVALUATION WAS PERFORMED  Erick Colacendrew E Starlett Pehrson 02/21/2018 7:34 AM

## 2018-02-21 NOTE — Progress Notes (Signed)
Physical Therapy Session Note  Patient Details  Name: Jocelyn [name] MRN: [id number] Date of Birth: 04/23/1966  Today's Date: 02/21/2018 PT Individual Time: 1000-1100 PT Individual Time Calculation (min): 60 min   Short Term Goals: Week 1:  PT Short Term Goal 1 (Week 1): Pt will perform stand pivot transfer with min assist PT Short Term Goal 2 (Week 1): Pt will ambulate x 50 ft with min assist and LRAD PT Short Term Goal 3 (Week 1): Pt will initiate stair training for funtional strengthening  Skilled Therapeutic Interventions/Progress Updates: Pt presented in w/c agreeable to therapy. PTA donned socks/shoes total A for time management. Pt propelled to rehab gym supervision with min cues for maintaining straight trajectory and x 1 cue for obstacle avoidance on L. Attempted trial AFO however unable to get foot inside shoe with AFO on. Advised if possible have husband bring in shoe that we are able to loosen. Performed squat pivot to L with minA and cues for sequencing. Performed toe taps LLE x 8 and RLE x 10 with tactile cues for increased L hip activation. Performed sit to/from stand x 5 with pt placing RUE on L knee for support and for increased facilitation of LLE. Pt able to perform with minA no AD. Performed stand pivot to R to return to w/c with minA and mod verbal cues for sequencing. Pt transported back to room at end of session and remained in w/c with quick release belt placed and call bell within reach.      Therapy Documentation Precautions:  Precautions Precautions: Fall Precaution Comments: L hemiplegia  Restrictions Weight Bearing Restrictions: No General:   Vital Signs:   Pain: Pain Assessment Pain Scale: 0-10 Pain Score: 0-No pain  See Function Navigator for Current Functional Status.   Therapy/Group: Individual Therapy  Ferrell [employer]orne  Thelia Tanksley, PTA  02/21/2018, 1:41 PM

## 2018-02-22 ENCOUNTER — Inpatient Hospital Stay (HOSPITAL_COMMUNITY): Payer: 59 | Admitting: Physical Therapy

## 2018-02-22 ENCOUNTER — Inpatient Hospital Stay (HOSPITAL_COMMUNITY): Payer: 59 | Admitting: Speech Pathology

## 2018-02-22 ENCOUNTER — Inpatient Hospital Stay (HOSPITAL_COMMUNITY): Payer: 59 | Admitting: Occupational Therapy

## 2018-02-22 LAB — BASIC METABOLIC PANEL
Anion gap: 9 (ref 5–15)
BUN: 13 mg/dL (ref 6–20)
CHLORIDE: 106 mmol/L (ref 101–111)
CO2: 23 mmol/L (ref 22–32)
Calcium: 8.9 mg/dL (ref 8.9–10.3)
Creatinine, Ser: 0.76 mg/dL (ref 0.44–1.00)
GFR calc Af Amer: >60 mL/min (ref >60)
GFR calc non Af Amer: >60 mL/min (ref >60)
Glucose, Bld: 84 mg/dL (ref 65–99)
POTASSIUM: 3.6 mmol/L (ref 3.5–5.1)
SODIUM: 138 mmol/L (ref 135–145)

## 2018-02-22 LAB — CBC
HEMATOCRIT: 33 % — AB (ref 36.0–46.0)
Hemoglobin: 9.3 g/dL — ABNORMAL LOW (ref 12.0–15.0)
MCH: 18.2 pg — AB (ref 26.0–34.0)
MCHC: 28.2 g/dL — ABNORMAL LOW (ref 30.0–36.0)
MCV: 64.6 fL — AB (ref 78.0–100.0)
Platelets: 392 10*3/uL (ref 150–400)
RBC: 5.11 MIL/uL (ref 3.87–5.11)
RDW: 23.4 % — AB (ref 11.5–15.5)
WBC: 5.4 10*3/uL (ref 4.0–10.5)

## 2018-02-22 NOTE — Progress Notes (Signed)
Physical Therapy Session Note  Patient Details  Name: Jocelyn [name] MRN: [id number] Date of Birth: 1966/02/08  Today's Date: 02/22/2018 PT Individual Time:1130-1200   30 min   Short Term Goals: Week 1:  PT Short Term Goal 1 (Week 1): Pt will perform stand pivot transfer with min assist PT Short Term Goal 2 (Week 1): Pt will ambulate x 50 ft with min assist and LRAD PT Short Term Goal 3 (Week 1): Pt will initiate stair training for funtional strengthening  Skilled Therapeutic Interventions/Progress Updates:    Pt received sitting in WC and agreeable to PT  PT transported pt to rehab gym in [facility]. PT instructed pt in gait training 2 x 20 ft with and without LAFO. Pt noted not have mild improvement in foot clearance as well as moderate improvement in knee control to prevent GR with AFO compared to no AFO. Sit<>stand with min assist x   3 throughout treatment. Min cues for proper UE placement and anterior weight shift. Pt returned to room and performed squat pivot transfer to bed with min assist. Sit>supine completed with min assist, and left supine in bed with call bell in reach and all needs met.       Therapy Documentation Precautions:  Precautions Precautions: Fall Precaution Comments: L hemiplegia  Restrictions Weight Bearing Restrictions: No Vital Signs: Therapy Vitals BP: 137/76 Pain: Pain Assessment Pain Score: 0-No pain   See Function Navigator for Current Functional Status.   Therapy/Group: Individual Therapy  Lorie Phenix 02/22/2018, 11:33 AM

## 2018-02-22 NOTE — Progress Notes (Signed)
[place] PHYSICAL MEDICINE & REHABILITATION     PROGRESS NOTE  Subjective/Complaints:   Pt has no complaints about sleep, no issues in therapy yesterday  ROS: Denies CP, SOB, nausea, vomiting diarrhea.  Objective: Vital Signs: Blood pressure 114/75, pulse 79, temperature 98.6 F (37 C), temperature source Oral, resp. rate 16, height 5' 7"  (1.702 m), weight 83.6 kg (184 lb 4.9 oz), SpO2 99 %. No results found. Recent Labs    02/22/18 0546  WBC 5.4  HGB 9.3*  HCT 33.0*  PLT 392   Recent Labs    02/22/18 0546  NA 138  K 3.6  CL 106  GLUCOSE 84  BUN 13  CREATININE 0.76  CALCIUM 8.9   CBG (last 3)  No results for input(s): GLUCAP in the last 72 hours.  Wt Readings from Last 3 Encounters:  02/17/18 83.6 kg (184 lb 4.9 oz)  02/13/18 81.2 kg (179 lb 0.2 oz)  07/21/17 80.3 kg (177 lb)    Physical Exam:  BP 114/75 (BP Location: Right Arm)   Pulse 79   Temp 98.6 F (37 C) (Oral)   Resp 16   Ht 5' 7"  (1.702 m)   Wt 83.6 kg (184 lb 4.9 oz)   SpO2 99%   BMI 28.87 kg/m  Constitutional: She appears well-developed and well-nourished. NAD. HENT: Normocephalic and atraumatic.  Eyes: EOM are normal. No discharge.  Cardiovascular: Normal rate and regular rhythm. No JVD. Respiratory: No respiratory distress. Clear. GI: Bowel sounds are normal. She exhibits no distension.  Musculoskeletal: She exhibits no edema or tenderness.    Neurological: She is alert and oriented Soft voice.  Left facial weakness.  Left inattention noted.  Able to follow simple one step commands without difficulty.  Motor: LUE: trace finger flexors and biceps LLE: HF 2+/5, and KE Skin: Skin is warm and dry.  Psychiatric: She has a normal mood and affect. Her behavior is normal.   Assessment/Plan: 1. 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 Physiatrist is providing close  team supervision and 24 hour management of active medical problems listed below. Physiatrist and rehab team continue to assess barriers to discharge/monitor patient progress toward functional and medical goals.  Function:  Bathing Bathing position Bathing activity did not occur: N/A Position: Shower  Bathing parts Body parts bathed by patient: Left arm, Abdomen, Chest, Front perineal area, Right upper leg, Left upper leg, Left lower leg, Right lower leg Body parts bathed by helper: Buttocks, Back, Right arm  Bathing assist Assist Level: Touching or steadying assistance(Pt > 75%)      Upper Body Dressing/Undressing Upper body dressing   What is the patient wearing?: Pull over shirt/dress, Bra Bra - Perfomed by patient: Thread/unthread right bra strap Bra - Perfomed by helper: Thread/unthread left bra strap, Hook/unhook bra (pull down sports bra) Pull over shirt/dress - Perfomed by patient: Thread/unthread right sleeve, Put head through opening, Pull shirt over trunk Pull over shirt/dress - Perfomed by helper: Thread/unthread left sleeve        Upper body assist Assist Level: Touching or steadying assistance(Pt > 75%)      Lower Body Dressing/Undressing Lower body dressing   What is the patient wearing?: Underwear, Pants Underwear - Performed by patient: Thread/unthread right underwear leg Underwear - Performed by helper: Thread/unthread left underwear leg, Pull underwear up/down Pants- Performed by patient: Pull pants up/down, Thread/unthread right pants leg Pants- Performed by  helper: Thread/unthread right pants leg, Thread/unthread left pants leg, Pull pants up/down   Non-skid slipper socks- Performed by helper: Don/doff right sock, Don/doff left sock                  Lower body assist Assist for lower body dressing: Touching or steadying assistance (Pt > 75%)      Toileting Toileting Toileting activity did not occur: No continent bowel/bladder event Toileting steps  completed by patient: Performs perineal hygiene, Adjust clothing prior to toileting Toileting steps completed by helper: Adjust clothing after toileting Toileting Assistive Devices: Grab bar or rail  Toileting assist Assist level: Touching or steadying assistance (Pt.75%)   Transfers Chair/bed transfer   Chair/bed transfer method: Stand pivot Chair/bed transfer assist level: Moderate assist (Pt 50 - 74%/lift or lower) Chair/bed transfer assistive device: Armrests, Bedrails     Locomotion Ambulation     Max distance: 30 ft Assist level: Moderate assist (Pt 50 - 74%)   Wheelchair     Max wheelchair distance: 173f Assist Level: Supervision or verbal cues  Cognition Comprehension Comprehension assist level: Follows complex conversation/direction with extra time/assistive device  Expression Expression assist level: Expresses complex ideas: With extra time/assistive device  Social Interaction Social Interaction assist level: Interacts appropriately with others with medication or extra time (anti-anxiety, antidepressant).  Problem Solving Problem solving assist level: Solves complex problems: With extra time  Memory Memory assist level: Complete Independence: No helper     Medical Problem List and Plan: 1.  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   CIR PT, OT, SLP Team conference today please see physician documentation under team conference tab, met with team face-to-face to discuss problems,progress, and goals. Formulized individual treatment plan based on medical history, underlying problem and comorbidities. 2.  DVT Prophylaxis/Anticoagulation: Mechanical: Sequential compression devices, below knee Bilateral lower extremities 3. Pain Management: N/A.  4. Mood: LCSW to follow for evaluation and support.  5. Neuropsych: This patient is not fully capable of making decisions on her own behalf. 6. Skin/Wound Care:   Routine pressure relief measures.  7. Fluids/Electrolytes/Nutrition: Monitor I/Os.    7237mintake, encourage         8. HTN: Monitor BP bid. Continue Norvasc. Titrate Prinivil upwards for tighter control.     Vitals:   02/21/18 0823 02/21/18 1400  BP: 124/73 114/75  Pulse: 72 79  Resp: 18 16  Temp: 98.1 F (36.7 C) 98.6 F (37 C)  SpO2: 100% 99%  controlled 4/17 9.  Acute on chronic anemia: Continue iron supplement.    Hemoglobin 9.1 on 4/13 Check stool guaic, last recorded stool guaic 2016 neg  LOS (Days) 5 A FACE TO FACE EVALUATION WAS PERFORMED  AnCharlett Blake/17/2019 8:01 AM

## 2018-02-22 NOTE — Progress Notes (Signed)
Physical Therapy Session Note  Patient Details  Name: Jocelyn [name] MRN: [id number] Date of Birth: 13-Jun-1966  Today's Date: 02/22/2018 PT Individual Time: 1305-1400 PT Individual Time Calculation (min): 55 min   Short Term Goals: Week 1:  PT Short Term Goal 1 (Week 1): Pt will perform stand pivot transfer with min assist PT Short Term Goal 2 (Week 1): Pt will ambulate x 50 ft with min assist and LRAD PT Short Term Goal 3 (Week 1): Pt will initiate stair training for funtional strengthening  Skilled Therapeutic Interventions/Progress Updates: Pt presented in bed agreeable to therapy. PTA donned shoes AFO total assist for time management. Performed stand pivot with RW to w/c mod A with verbal cues for safe use of RW. Pt transported to rehab gym for energy conservation and performed stand pivot to mat. Participated in standing balance/tolerance activities including horseshoes, and placing horseshoes on basketball hoop. Pt required mod cues for engaging L quad and PTA blocking L knee to minimize hyperextension. Participated in gait training with AFO x 70f with cues for increasing BOS and staying within RW. Pt with noted hyperextension approx 75% of time however able to correct with mod to max verbal cues. Performed stand pivot to w/c in same manner as prior and pt propelled back to room via hemi-technique for endurance. Performed squat pivot transfer to bed and performed sit to supine with minA and use of bed rail. Pt left in bed with bed alarm on, call bell within reach and needs met.      Therapy Documentation Precautions:  Precautions Precautions: Fall Precaution Comments: L hemiplegia  Restrictions Weight Bearing Restrictions: No General:   Vital Signs: Therapy Vitals Temp: 97.9 F (36.6 C) Temp Source: Oral Pulse Rate: 66 Resp: 16 BP: 108/80 Patient Position (if appropriate): Lying Oxygen Therapy SpO2: 100 % O2 Device: Room Air   See Function Navigator for Current  Functional Status.   Therapy/Group: Individual Therapy  Normand Damron  Vian Fluegel, PTA  02/22/2018, 3:46 PM

## 2018-02-22 NOTE — Progress Notes (Signed)
Speech Language Pathology Session Note and Discharge Summary  Patient Details  Name: MANHA AMATO MRN: [id number] Date of Birth: [birth year]  Today's Date: 02/22/2018 SLP Individual Time: 1030-1100 SLP Individual Time Calculation (min): 30 min   Skilled Therapeutic Interventions: Skilled treatment session focused on communication goals. Pt demonstrated speech intelligibility at the conversation level with 100% accuracy during a verbal description task. However, intermittent distortion of /s/ noted, which pt reported is baseline, suspect due to orthodontic spacer. Pt's goals are met at the Mod I level. Pt educated on the accomplished goals and discharge plan, and verbalized understanding and agreement. Pt left upright in wheelchair with quick release belt on and NT present.     Patient has met 2 of 2 long term goals.  Patient to discharge at overall Modified Independent level.    Clinical Impression/Discharge Summary: Pt has made functional gains and has met 2 out of 2 LTGs this admission due to improved cognitive-linguistic functioning and speech intelligibility. Currently, pt demonstrates complex problem solving and is able to achieve 100% speech intelligibility at the conversation level with Mod I. Pt education complete and SLP will discharge pt from skilled SLP services. Follow up is not warranted at this time.    Recommendation:  None      Equipment: N/A   Reasons for discharge: Treatment goals met   Patient/Family Agrees with Progress Made and Goals Achieved: Yes   Function:  Cognition Comprehension Comprehension assist level: Follows complex conversation/direction with extra time/assistive device  Expression   Expression assist level: Expresses complex ideas: With extra time/assistive device  Social Interaction Social Interaction assist level: Interacts appropriately with others with medication or extra time (anti-anxiety, antidepressant).  Problem Solving Problem solving  assist level: Solves complex problems: With extra time  Memory Memory assist level: Complete Independence: No helper   Pain: Pain Assessment: No/denies pain  Meredeth Ide  SLP - Student 02/22/2018, 11:27 AM

## 2018-02-22 NOTE — Patient Care Conference (Signed)
Inpatient RehabilitationTeam Conference and Plan of Care Update Date: 02/22/2018   Time: 11:15 AM    Patient Name: Jocelyn [name]      Medical Record Number: [id number]  Date of Birth: 02-Aug-1966 Sex: Female         Room/Bed: 4W17C/4W17C-01 Payor Info: Payor: [employer] park [employer] / Plan: [place] / Product Type: *No Product type* /    Admitting Diagnosis: R BG Hem  Admit Date/Time:  02/17/2018  6:38 PM Admission Comments: No comment available   Primary Diagnosis:  <principal problem not specified> Principal Problem: <principal problem not specified>  Patient Active Problem List   Diagnosis Date Noted  . Acute blood loss anemia   . 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 Hyperlipemia 02/16/2018  . Cerebral edema ([place]) 02/16/2018  . Benign essential HTN   . Anemia of chronic disease   . Amphetamine abuse ([place])   . Hypokalemia   . ICH (intracerebral hemorrhage) ([place]) - R basal ganglia 02/13/2018  . Pneumonia, community acquired 09/23/2015  . Microcytic hypochromic anemia 09/23/2015  . Tobacco abuse 09/23/2015  . Pneumonia 09/23/2015  . Nicotine addiction 12/09/2013  . Microcytic anemia 12/09/2013  . HTN (hypertension) 12/09/2013  . BMI 36.0-36.9,adult 12/09/2013    Expected Discharge Date: Expected Discharge Date: 03/08/18  Team Members Present: Physician leading conference: Dr. Alysia Penna Social Worker Present: Ovidio Kin, LCSW Nurse Present: Dorthula Nettles, RN PT Present: Barrie Folk, PT OT Present: Cherylynn Ridges, OT SLP Present: Weston Anna, SLP PPS Coordinator present : Daiva Nakayama, RN, CRRN     Current Status/Progress Goal Weekly Team Focus  Medical   cognition ok, constipation Left hemiparesis  reduce fall risk, maintain medical stability  poor Left knee control   Bowel/Bladder   Continent of B/B  continue to remain continent  No s/s of constipation/UTI   Swallow/Nutrition/ Hydration             ADL's   Min/mod A  overall, L hemiparesis  Supervision  L UE NMR, functional transfers, NMES,, modified bathing/dressing   Mobility   Min-mod assist overall, stand pivot transfers and gait up to 30' w/ RW, most assist needed to prevent L knee buckling  supervision  LLE NMR (esp. knee), transfers and gait   Communication   Mod I  Mod I  Goals Met, D/C from SLP    Safety/Cognition/ Behavioral Observations  Mod I  Mod I  Goals Met, D/C from SLP    Pain   No c/o pain  Less than 2   Access every 4hrs and PRN   Skin   CDI  remain CDI  Access skin q shift and PRN      *See Care Plan and progress notes for long and short-term goals.     Barriers to Discharge  Current Status/Progress Possible Resolutions Date Resolved   Physician    Medical stability;Other (comments)  poor awareness of deficit  progressing toward goals  cont rehab      Nursing                  PT  Decreased caregiver support  husband works full time              OT                  SLP                SW Decreased caregiver support Husband works may take [employer]  Discharge Planning/Teaching Needs:  Working on a plan for 24 hr supervision. Husband works during the day. Will work on safe plan      Team Discussion:  Goals supervision level. Getting some return in her left shoulder. May need AFO for foot clearance-PT to continue to assess. At times left knee buckles-working on strengthening this. Pt making good progress in therapies. Working out 24 hr supervision for discharge.  Revisions to Treatment Plan:  DC 5/2    Continued Need for Acute Rehabilitation Level of Care: The patient requires daily medical management by a physician with specialized training in physical medicine and rehabilitation for the following conditions: Daily direction of a multidisciplinary physical rehabilitation program to ensure safe treatment while eliciting the highest outcome that is of practical value to the patient.: Yes Daily medical management  of patient stability for increased activity during participation in an intensive rehabilitation regime.: Yes Daily analysis of laboratory values and/or radiology reports with any subsequent need for medication adjustment of medical intervention for : Neurological problems;Wound care problems  [name] 02/23/2018, 9:05 AM

## 2018-02-22 NOTE — Progress Notes (Signed)
Occupational Therapy Session Note  Patient Details  Name: Jocelyn [name] MRN: [id number] Date of Birth: [birth year]  Today's Date: 02/22/2018 OT Individual Time: [id number] OT Individual Time Calculation (min): 75 min    Short Term Goals: Week 1:  OT Short Term Goal 1 (Week 1): Pt will recall hemi-dressing techniques 2 consecutive days with min questioning cues OT Short Term Goal 2 (Week 1): Pt will complete 1/4 steps of toileting task OT Short Term Goal 3 (Week 1): Pt will maintain static standing balance for 1 minutes with min A at RW/ sink in prep for functional task  Skilled Therapeutic Interventions/Progress Updates:    Pt greeted semi-reclined in bed, requesting to go to the bathroom. Stand-pivot bed>wc>toilet with mod A overall and facilitation for weight shifting to elicit pivot. Pt voided bladder and completed peri-care with supervision. Stand-pivot into shower with grab bars and min/mod A. L NMR within bathing tasks with weight bearing to wash body parts-needed hand over hand A with improved L shoulder activation. Educated on hemi dressing techniques and provided pt with shoe buttons. Worked on standing balance at the sink while facilitating weight bearing through L UE. Applied 1:1 NMES to CH1 supraspinatus and middle deltoid to help approximate shoulder joint, and Ch 2 wrist extensors.  Ratio 1:1 Rate 35 pps Waveform- Asymmetric Ramp 1.0 Pulse 300 CH1 Intensity- 10  Duration -  15  CH1 Intensity- 15 Duration -  15  Therapy Documentation Precautions:  Precautions Precautions: Fall Precaution Comments: L hemiplegia  Restrictions Weight Bearing Restrictions: No  Pain:  none/denies pain ADL: ADL ADL Comments: Please see functional navigator  See Function Navigator for Current Functional Status.   Therapy/Group: Individual Therapy  Jocelyn [name] 02/22/2018, 9:36 AM

## 2018-02-22 NOTE — Progress Notes (Signed)
Social Work Patient ID: Jocelyn [name], female   DOB: 1966-04-21, 52 y.o.   MRN: [id number]  Met with pt and spoken with husband-Ronnell via telephone to discuss team conference goals supervision level and target discharge date 5/2. Pt is very pleased with having a date to work toward. Ronnell will check with his sister to see if she can stay with pt while he works or he may have to take [employer]. He will work on this in the next tow weeks. Aware insurance does not cover custodial care. Will work toward discharge.

## 2018-02-23 ENCOUNTER — Inpatient Hospital Stay (HOSPITAL_COMMUNITY): Payer: 59 | Admitting: Occupational Therapy

## 2018-02-23 ENCOUNTER — Inpatient Hospital Stay (HOSPITAL_COMMUNITY): Payer: 59

## 2018-02-23 ENCOUNTER — Inpatient Hospital Stay (HOSPITAL_COMMUNITY): Payer: 59 | Admitting: Physical Therapy

## 2018-02-23 LAB — OCCULT BLOOD X 1 CARD TO LAB, STOOL: Fecal Occult Bld: NEGATIVE

## 2018-02-23 NOTE — Plan of Care (Signed)
  Problem: Consults Goal: RH STROKE PATIENT EDUCATION Description See Patient Education module for education specifics  Outcome: Progressing   Problem: RH BOWEL ELIMINATION Goal: RH STG MANAGE BOWEL WITH ASSISTANCE Description STG Manage Bowel with mod I.  Outcome: Progressing Goal: RH STG MANAGE BOWEL W/MEDICATION W/ASSISTANCE Description STG Manage Bowel with Medication with mod I.  Outcome: Progressing   Problem: RH BLADDER ELIMINATION Goal: RH STG MANAGE BLADDER WITH ASSISTANCE Description STG Manage Bladder With mod i  Outcome: Progressing Goal: RH STG MANAGE BLADDER WITH MEDICATION WITH ASSISTANCE Description STG Manage Bladder With Medication With mod I.  Outcome: Progressing   Problem: RH SKIN INTEGRITY Goal: RH STG SKIN FREE OF INFECTION/BREAKDOWN Outcome: Progressing Goal: RH STG MAINTAIN SKIN INTEGRITY WITH ASSISTANCE Description STG Maintain Skin Integrity With mod i.  Outcome: Progressing   Problem: RH SAFETY Goal: RH STG ADHERE TO SAFETY PRECAUTIONS W/ASSISTANCE/DEVICE Description STG Adhere to Safety Precautions With min Assistance/Device.  Outcome: Progressing Goal: RH STG DECREASED RISK OF FALL WITH ASSISTANCE Description STG Decreased Risk of Fall With min Assistance.  Outcome: Progressing   Problem: RH COGNITION-NURSING Goal: RH STG ANTICIPATES NEEDS/CALLS FOR ASSIST W/ASSIST/CUES Description STG Anticipates Needs/Calls for Assist With min Assistance/Cues.  Outcome: Progressing   Problem: RH PAIN MANAGEMENT Goal: RH STG PAIN MANAGED AT OR BELOW PT'S PAIN GOAL Description Pain less than or equal to 2.  Outcome: Progressing   Problem: RH KNOWLEDGE DEFICIT Goal: RH STG INCREASE KNOWLEDGE OF HYPERTENSION Outcome: Progressing

## 2018-02-23 NOTE — Progress Notes (Signed)
Physical Therapy Session Note  Patient Details  Name: Jocelyn [name] MRN: [id number] Date of Birth: May 29, 1966  Today's Date: 02/23/2018 PT Individual Time: 0830-0930 PT Individual Time Calculation (min): 60 min   Short Term Goals: Week 1:  PT Short Term Goal 1 (Week 1): Pt will perform stand pivot transfer with min assist PT Short Term Goal 2 (Week 1): Pt will ambulate x 50 ft with min assist and LRAD PT Short Term Goal 3 (Week 1): Pt will initiate stair training for funtional strengthening  Skilled Therapeutic Interventions/Progress Updates:    Pt engaged in dressing EOB for donning pants, socks, and shoes (with L AFO) prior to OOB. Figure four pattern in seated position EOB for donning of pants, removal of gripper socks, and donning of R shoe. PT assisted with donning of L shoe with AFO for time management. Sit <> stands with mod assist with focus on technique and equal weightbearing and use of LLE during sit <> stands. Min assist for standing balance with PT assisting with pulling up of tight leggings. Min assist for transfer to w/c with RW for support and cues for positioning of RW and attention to L foot placement. Trial with GRAFO vs posterior leaf spring AFO pt trialled yesterday to allow for more stance control in L knee due to increased hyperextension. With GRAFO, NMR during gait training with pt able to demonstrate improved stance control in L knee especially with cueing and demonstrated improved foot clearance with active hip flexion to advance, min assist overall x 20', x 30', x 15'. NMR for LLE knee control and hip flexion in standing with 2" step for toe taps x 10 reps with R with focus on L stance control and then 10 reps on L for focus on hip flexion activation. During all transitions with sit <> stands and RW, focused on pt managing LUE on and off orthosis with cues to remember to remove hand prior to sitting. Educated on skin inspection importance with use of AFO and plan for  next week to have orthotist come to assess and order pt's personal AFO if continuing to demonstrate need and improved gait/transfers with it's use. Discussed with primary PT.  Therapy Documentation Precautions:  Precautions Precautions: Fall Precaution Comments: L hemiplegia  Required Braces or Orthoses: Other Brace/Splint Other Brace/Splint: L AFO Restrictions Weight Bearing Restrictions: No Pain: Denies pain.   See Function Navigator for Current Functional Status.   Therapy/Group: Individual Therapy  Karolee StampsGray, [place] Sandall Darrol PokeBrescia  Bernita Beckstrom B. Tywan Siever, PT, DPT  02/23/2018, 9:40 AM

## 2018-02-23 NOTE — Progress Notes (Signed)
Occupational Therapy Session Note  Patient Details  Name: Jocelyn [name] MRN: [id number] Date of Birth: September 19, 1966  Today's Date: 02/23/2018 OT Individual Time: 1000-1100 OT Individual Time Calculation (min): 60 min   Short Term Goals: Week 1:  OT Short Term Goal 1 (Week 1): Pt will recall hemi-dressing techniques 2 consecutive days with min questioning cues OT Short Term Goal 2 (Week 1): Pt will complete 1/4 steps of toileting task OT Short Term Goal 3 (Week 1): Pt will maintain static standing balance for 1 minutes with min A at RW/ sink in prep for functional task  Skilled Therapeutic Interventions/Progress Updates:    OT treatment session focused on modified bathing/dressing, transfers, and L UE NMR. Pt greeted sitting in wc and requests to use the bathroom. Stand-pivot to toilet with Mod A to L and min A to R. Pt needed min/mod A for standing balance without UE support when reaching to doff pants. Pt voided bladder successfully . Mod A overall again for standing balance when pull up pants. Pt with poor recall of hemi-dressing techniques for UB. Required instructional cues to recall technique and complete successfully. L UE NMR with weight bearing at raised therapy mat. Provided elbow/shoulder stability while weight bearing. Utilized mirror therapy to try to facilitate wrist extension in L UE. Pt with some improved biceps activation, but none at wrist. Pt brought into sidelying position and placed L UE supported on table, then worked on elbow flex/ext using roller handle. Improved activation with repetition. Pt returned to room at end of session and left seated in wc with safety belt on and needs met.   Therapy Documentation Precautions:  Precautions Precautions: Fall Precaution Comments: L hemiplegia  Required Braces or Orthoses: Other Brace/Splint Other Brace/Splint: L AFO Restrictions Weight Bearing Restrictions: No Pain: Pain Assessment Pain Scale: 0-10 Pain Score: 0-No  pain ADL: ADL ADL Comments: Please see functional navigator  See Function Navigator for Current Functional Status.   Therapy/Group: Individual Therapy  Valma Cava 02/23/2018, 11:02 AM

## 2018-02-23 NOTE — Progress Notes (Signed)
[facility] PHYSICAL MEDICINE & REHABILITATION     PROGRESS NOTE  Subjective/Complaints:   Pt states d/c date 5/2 No new issues  ROS: Denies CP, SOB, nausea, vomiting diarrhea.  Objective: Vital Signs: Blood pressure 130/83, pulse 77, temperature 99 F (37.2 C), temperature source Oral, resp. rate 16, height 5\' 7"  (1.702 m), weight 84 kg (185 lb 3 oz), SpO2 100 %. No results found. Recent Labs    02/22/18 0546  WBC 5.4  HGB 9.3*  HCT 33.0*  PLT 392   Recent Labs    02/22/18 0546  NA 138  K 3.6  CL 106  GLUCOSE 84  BUN 13  CREATININE 0.76  CALCIUM 8.9   CBG (last 3)  No results for input(s): GLUCAP in the last 72 hours.  Wt Readings from Last 3 Encounters:  02/23/18 84 kg (185 lb 3 oz)  02/13/18 81.2 kg (179 lb 0.2 oz)  07/21/17 80.3 kg (177 lb)    Physical Exam:  BP 130/83 (BP Location: Right Arm)   Pulse 77   Temp 99 F (37.2 C) (Oral)   Resp 16   Ht 5\' 7"  (1.702 m)   Wt 84 kg (185 lb 3 oz)   SpO2 100%   BMI 29.00 kg/m  Constitutional: She appears well-developed and well-nourished. NAD. HENT: Normocephalic and atraumatic.  Eyes: EOM are normal. No discharge.  Cardiovascular: Normal rate and regular rhythm. No JVD. Respiratory: No respiratory distress. Clear. GI: Bowel sounds are normal. She exhibits no distension.  Musculoskeletal: She exhibits no edema or tenderness.    Neurological: She is alert and oriented Soft voice.  Left facial weakness.  Left inattention noted.  Able to follow simple one step commands without difficulty.  Motor: LUE: trace finger flexors and biceps LLE: HF 2+/5, and KE Skin: Skin is warm and dry.  Psychiatric: She has a normal mood and affect. Her behavior is normal.   Assessment/Plan: 1. 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 Physiatrist is providing close team supervision and 24 hour  management of active medical problems listed below. Physiatrist and rehab team continue to assess barriers to discharge/monitor patient progress toward functional and medical goals.  Function:  Bathing Bathing position Bathing activity did not occur: N/A Position: Shower  Bathing parts Body parts bathed by patient: Left arm, Abdomen, Chest, Front perineal area, Right upper leg, Left upper leg, Left lower leg, Right lower leg Body parts bathed by helper: Buttocks, Back, Right arm  Bathing assist Assist Level: Touching or steadying assistance(Pt > 75%)      Upper Body Dressing/Undressing Upper body dressing   What is the patient wearing?: Pull over shirt/dress, Bra Bra - Perfomed by patient: Thread/unthread right bra strap Bra - Perfomed by helper: Thread/unthread left bra strap, Hook/unhook bra (pull down sports bra) Pull over shirt/dress - Perfomed by patient: Thread/unthread right sleeve, Put head through opening, Pull shirt over trunk Pull over shirt/dress - Perfomed by helper: Thread/unthread left sleeve        Upper body assist Assist Level: Touching or steadying assistance(Pt > 75%)      Lower Body Dressing/Undressing Lower body dressing   What is the patient wearing?: Underwear, Pants Underwear - Performed by patient: Thread/unthread right underwear leg Underwear - Performed by helper: Thread/unthread left underwear leg, Pull underwear up/down Pants- Performed by patient: Pull pants up/down, Thread/unthread right pants leg Pants- Performed by helper: Thread/unthread right  pants leg, Thread/unthread left pants leg, Pull pants up/down   Non-skid slipper socks- Performed by helper: Don/doff right sock, Don/doff left sock                  Lower body assist Assist for lower body dressing: Touching or steadying assistance (Pt > 75%)      Toileting Toileting Toileting activity did not occur: No continent bowel/bladder event Toileting steps completed by patient: Performs  perineal hygiene, Adjust clothing prior to toileting Toileting steps completed by helper: Adjust clothing after toileting Toileting Assistive Devices: Grab bar or rail  Toileting assist Assist level: Touching or steadying assistance (Pt.75%)   Transfers Chair/bed transfer   Chair/bed transfer method: Stand pivot Chair/bed transfer assist level: Moderate assist (Pt 50 - 74%/lift or lower) Chair/bed transfer assistive device: Armrests     Locomotion Ambulation     Max distance: 30 ft Assist level: Moderate assist (Pt 50 - 74%)   Wheelchair     Max wheelchair distance: [id number] Assist Level: Supervision or verbal cues  Cognition Comprehension Comprehension assist level: Follows complex conversation/direction with extra time/assistive device  Expression Expression assist level: Expresses complex ideas: With extra time/assistive device  Social Interaction Social Interaction assist level: Interacts appropriately with others with medication or extra time (anti-anxiety, antidepressant).  Problem Solving Problem solving assist level: Solves complex problems: With extra time  Memory Memory assist level: Complete Independence: No helper     Medical Problem List and Plan: 1.  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   CIR PT, OT, SLP progressing toward goals 2.  DVT Prophylaxis/Anticoagulation: Mechanical: Sequential compression devices, below knee Bilateral lower extremities 3. Pain Management: N/A.  4. Mood: LCSW to follow for evaluation and support.  5. Neuropsych: This patient is not fully capable of making decisions on her own behalf. 6. Skin/Wound Care:  Routine pressure relief measures.  7. Fluids/Electrolytes/Nutrition: Monitor I/Os.    360ml intake, encourage         8. HTN: Monitor BP bid. Continue Norvasc. Titrate Prinivil upwards for tighter control.     Vitals:   02/22/18 2157 02/23/18 0546  BP: 137/84 130/83   Pulse:  77  Resp:  16  Temp:  99 F (37.2 C)  SpO2:  100%  controlled 4/18 9.  Acute on chronic anemia: Continue iron supplement.    Hemoglobin 9.1 on 4/13 Check stool guaic, last recorded stool guaic 2016 neg  LOS (Days) 6 A FACE TO FACE EVALUATION WAS PERFORMED  Erick Colacendrew E Eleesha Purkey 02/23/2018 7:48 AM

## 2018-02-23 NOTE — Progress Notes (Signed)
Social Work Patient ID: Jocelyn [name], female   DOB: [birth year], 52 y.o.   MRN: [id number] Faxed in clinicals for update to [employer]-awaiting response regarding additional days.

## 2018-02-23 NOTE — Progress Notes (Signed)
Physical Therapy Session Note  Patient Details  Name: Jocelyn [name] MRN: [id number] Date of Birth: 09/25/1966  Today's Date: 02/23/2018 PT Individual Time: 1330-1440 PT Individual Time Calculation (min): 70 min   Short Term Goals: Week 1:  PT Short Term Goal 1 (Week 1): Pt will perform stand pivot transfer with min assist PT Short Term Goal 2 (Week 1): Pt will ambulate x 50 ft with min assist and LRAD PT Short Term Goal 3 (Week 1): Pt will initiate stair training for funtional strengthening  Skilled Therapeutic Interventions/Progress Updates:   Pt in supine and agreeable to therapy, denies pain. Session focused on functional independence, gait training, and LLE NMR. Pt transferred to EOB w/ min assist, total assist to don shoes and LAFO for time management. Ambulated to/from toilet w/ min assist using RW w/ RUE orthosis. Performed pericare w/ supervision, min assist to manage LE garments. Pt self-propelled w/c to/from therapy gym using R hemi technique. Worked on pre-gait tasks w/ support on hemiwalker using RUE to allow therapist to provide min guard on L hemi side. Performed 3" step taps w/ both LEs to work on foot clearance. Emphasis on maintaining normal pelvic alignment when tapping w/ RLE. Performed side stepping 30" in each direction at rail in hallway to work on L hip abduction strength and sequencing of step placement. Ambulated 30" backwards and forwards as well for sequencing of step placement and emphasis placed on increasing BOS. Returned to room and ended session in w/c and in care of husband, all needs met.   Therapy Documentation Precautions:  Precautions Precautions: Fall Precaution Comments: L hemiplegia  Required Braces or Orthoses: Other Brace/Splint Other Brace/Splint: L AFO Restrictions Weight Bearing Restrictions: No Vital Signs: Therapy Vitals Temp: 98.8 F (37.1 C) Temp Source: Oral Pulse Rate: 60 Resp: 18 BP: 107/70 Patient Position (if appropriate):  Sitting Oxygen Therapy SpO2: 100 % O2 Device: Room Air  See Function Navigator for Current Functional Status.   Therapy/Group: Individual Therapy  [name] 02/23/2018, 5:40 PM

## 2018-02-24 ENCOUNTER — Inpatient Hospital Stay (HOSPITAL_COMMUNITY): Payer: 59 | Admitting: Occupational Therapy

## 2018-02-24 ENCOUNTER — Inpatient Hospital Stay (HOSPITAL_COMMUNITY): Payer: 59 | Admitting: Physical Therapy

## 2018-02-24 LAB — BASIC METABOLIC PANEL
Anion gap: 12 (ref 5–15)
BUN: 13 mg/dL (ref 6–20)
CALCIUM: 9.5 mg/dL (ref 8.9–10.3)
CO2: 23 mmol/L (ref 22–32)
CREATININE: 0.77 mg/dL (ref 0.44–1.00)
Chloride: 105 mmol/L (ref 101–111)
GFR calc Af Amer: >60 mL/min (ref >60)
GLUCOSE: 120 mg/dL — AB (ref 65–99)
Potassium: 3.7 mmol/L (ref 3.5–5.1)
Sodium: 140 mmol/L (ref 135–145)

## 2018-02-24 NOTE — Progress Notes (Signed)
Physical Therapy Weekly Progress Note  Patient Details  Name: Jocelyn [name] MRN: [id number] Date of Birth: Jun 13, 1966  Beginning of progress report period: February 18, 2018 End of progress report period: February 24, 2018  Today's Date: 02/24/2018 PT Individual Time: 0915-1000 PT Individual Time Calculation (min): 45 min  PT Missed time: 15 min (toileting)  Pt toileting and agreeable to therapy, denies pain. Missed 15 min of skilled PT for toileting. Assisted pt w/ set-up assist to take a few bites of yogurt before leaving room. Total assist w/c transport to/from ADL apartment. Performed blocked practice of stand pivot transfers to/from bed and to/from couch w/ min guard. Pt able to safely and appropriately set-up transfer by making sure foot placement was correct, scooting forward, and performing anterior weight shift prior to boosting to stand. Min guard for safety during transfers. Husband present and educated on provided appropriate assist to guard on L hemi side. Husband practiced multiple transfers safely in addition to discussing toilet transfer w/ use of BSC riser over toilet. Instructed that pt's husband will need to be the one to manage LE garments for safety while pt held onto bar in bathroom. Husband and pt verbalized understanding and in agreement. Ended session in w/c and in care of husband, all needs met.   Patient has met 2 of 3 short term goals. Pt is progressing well w/ therapies and is consistently performing all functional mobility w/ min assist. L knee control is improving, allowing pt increased independence w/ functional balance tasks requiring pt to rely solely on LE support. She continues to demonstrate severe extension thrust w/ L single leg stance during gait, however it improves w/ use of L ground reaction AFO. Will continue to assess use of LAFO for increased safety and independence w/ gait prior to d/c. Pt's husband has been present for therapies and continues to work  towards providing 24/7 supervision at d/c.  Patient continues to demonstrate the following deficits muscle weakness, decreased cardiorespiratoy endurance, impaired timing and sequencing, unbalanced muscle activation, decreased coordination and decreased motor planning and decreased standing balance, decreased postural control, hemiplegia and decreased balance strategies and therefore will continue to benefit from skilled PT intervention to increase functional independence with mobility.  Patient progressing toward long term goals..  Continue plan of care. Bed mobility and transfer LTGs have been upgraded to modified independent 2/2 pt progress and possibility of d/c to home w/o 24/7 assist.   PT Short Term Goals Week 1:  PT Short Term Goal 1 (Week 1): Pt will perform stand pivot transfer with min assist PT Short Term Goal 1 - Progress (Week 1): Met PT Short Term Goal 2 (Week 1): Pt will ambulate x 50 ft with min assist and LRAD PT Short Term Goal 2 - Progress (Week 1): Progressing toward goal PT Short Term Goal 3 (Week 1): Pt will initiate stair training for funtional strengthening PT Short Term Goal 3 - Progress (Week 1): Met Week 2:  PT Short Term Goal 1 (Week 2): Pt will transfer bed<>chair w/ supervision  PT Short Term Goal 2 (Week 2): Pt will demonstrate safety awareness w/ all functional mobility 100% of the time PT Short Term Goal 3 (Week 2): Pt will maintain dynamic standing balance w/ min guard PT Short Term Goal 4 (Week 2): Pt will self-propel w/c in household environment w/ supervision  Skilled Therapeutic Interventions/Progress Updates:      Therapy Documentation Precautions:  Precautions Precautions: Fall Precaution Comments: L hemiplegia  Required Braces or  Orthoses: Other Brace/Splint Other Brace/Splint: L AFO Restrictions Weight Bearing Restrictions: No Vital Signs:    See Function Navigator for Current Functional Status.  Therapy/Group: Individual Therapy  Krysteena Stalker K  Arnette 02/24/2018, 9:59 AM

## 2018-02-24 NOTE — Progress Notes (Signed)
[facility] PHYSICAL MEDICINE & REHABILITATION     PROGRESS NOTE  Subjective/Complaints:   No issue overnite.  Pt states she was supposed to take Fe at home but didn't   ROS: Denies CP, SOB, nausea, vomiting diarrhea.  Objective: Vital Signs: Blood pressure 119/75, pulse 85, temperature 98.6 F (37 C), temperature source Oral, resp. rate 18, height 5\' 7"  (1.702 m), weight 84 kg (185 lb 3 oz), SpO2 100 %. No results found. Recent Labs    02/22/18 0546  WBC 5.4  HGB 9.3*  HCT 33.0*  PLT 392   Recent Labs    02/22/18 0546  NA 138  K 3.6  CL 106  GLUCOSE 84  BUN 13  CREATININE 0.76  CALCIUM 8.9   CBG (last 3)  No results for input(s): GLUCAP in the last 72 hours.  Wt Readings from Last 3 Encounters:  02/23/18 84 kg (185 lb 3 oz)  02/13/18 81.2 kg (179 lb 0.2 oz)  07/21/17 80.3 kg (177 lb)    Physical Exam:  BP 119/75 (BP Location: Right Arm)   Pulse 85   Temp 98.6 F (37 C) (Oral)   Resp 18   Ht 5\' 7"  (1.702 m)   Wt 84 kg (185 lb 3 oz)   SpO2 100%   BMI 29.00 kg/m  Constitutional: She appears well-developed and well-nourished. NAD. HENT: Normocephalic and atraumatic.  Eyes: EOM are normal. No discharge.  Cardiovascular: Normal rate and regular rhythm. No JVD. Respiratory: No respiratory distress. Clear. GI: Bowel sounds are normal. She exhibits no distension.  Musculoskeletal: She exhibits no edema or tenderness.    Neurological: She is alert and oriented Soft voice.  Left facial weakness.  Left inattention noted.  Able to follow simple one step commands without difficulty.  Motor: LUE: trace finger flexors and biceps LLE: HF 2+/5, and KE Skin: Skin is warm and dry.  Psychiatric: She has a normal mood and affect. Her behavior is normal.   Assessment/Plan: 1. 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 Physiatrist is providing  close team supervision and 24 hour management of active medical problems listed below. Physiatrist and rehab team continue to assess barriers to discharge/monitor patient progress toward functional and medical goals.  Function:  Bathing Bathing position Bathing activity did not occur: N/A Position: Shower  Bathing parts Body parts bathed by patient: Left arm, Abdomen, Chest, Front perineal area, Right upper leg, Left upper leg, Left lower leg, Right lower leg Body parts bathed by helper: Buttocks, Back, Right arm  Bathing assist Assist Level: Touching or steadying assistance(Pt > 75%)      Upper Body Dressing/Undressing Upper body dressing   What is the patient wearing?: Pull over shirt/dress, Bra Bra - Perfomed by patient: Thread/unthread right bra strap Bra - Perfomed by helper: Thread/unthread left bra strap, Hook/unhook bra (pull down sports bra) Pull over shirt/dress - Perfomed by patient: Thread/unthread right sleeve, Put head through opening, Pull shirt over trunk Pull over shirt/dress - Perfomed by helper: Thread/unthread left sleeve        Upper body assist Assist Level: Touching or steadying assistance(Pt > 75%)      Lower Body Dressing/Undressing Lower body dressing   What is the patient wearing?: Underwear, Pants Underwear - Performed by patient: Thread/unthread right underwear leg Underwear - Performed by helper: Thread/unthread left underwear leg, Pull underwear up/down Pants- Performed by patient: Pull pants up/down, Thread/unthread  right pants leg Pants- Performed by helper: Thread/unthread right pants leg, Thread/unthread left pants leg, Pull pants up/down   Non-skid slipper socks- Performed by helper: Don/doff right sock, Don/doff left sock                  Lower body assist Assist for lower body dressing: Touching or steadying assistance (Pt > 75%)      Toileting Toileting Toileting activity did not occur: No continent bowel/bladder event Toileting  steps completed by patient: Performs perineal hygiene, Adjust clothing prior to toileting Toileting steps completed by helper: Adjust clothing after toileting Toileting Assistive Devices: Grab bar or rail  Toileting assist Assist level: Touching or steadying assistance (Pt.75%)   Transfers Chair/bed transfer   Chair/bed transfer method: Stand pivot, Ambulatory Chair/bed transfer assist level: Touching or steadying assistance (Pt > 75%) Chair/bed transfer assistive device: Armrests, Walker, Orthosis     Locomotion Ambulation     Max distance: 30' Assist level: Touching or steadying assistance (Pt > 75%)   Wheelchair   Type: Manual Max wheelchair distance: 150' Assist Level: Supervision or verbal cues  Cognition Comprehension Comprehension assist level: Follows complex conversation/direction with extra time/assistive device  Expression Expression assist level: Expresses complex ideas: With extra time/assistive device  Social Interaction Social Interaction assist level: Interacts appropriately with others with medication or extra time (anti-anxiety, antidepressant).  Problem Solving Problem solving assist level: Solves complex problems: With extra time  Memory Memory assist level: Complete Independence: No helper     Medical Problem List and Plan: 1.  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   CIR PT, OT, SLP  2.  DVT Prophylaxis/Anticoagulation: Mechanical: Sequential compression devices, below knee Bilateral lower extremities 3. Pain Management: N/A.  4. Mood: LCSW to follow for evaluation and support.  5. Neuropsych: This patient is not fully capable of making decisions on her own behalf. 6. Skin/Wound Care:  Routine pressure relief measures.  7. Fluids/Electrolytes/Nutrition: Monitor I/Os.    Not recorded yesterday but has been low check BMET      8. HTN: Monitor BP bid. Continue Norvasc. Titrate Prinivil  upwards for tighter control.     Vitals:   02/23/18 1958 02/24/18 0500  BP: 135/82 119/75  Pulse: 71 85  Resp: 18 18  Temp: 99.3 F (37.4 C) 98.6 F (37 C)  SpO2: 100% 100%  controlled 4/19 9.  Acute on chronic anemia: Continue iron supplement.    Hemoglobin 9.1 on 4/13 Check stool guaic,4/18 negative  LOS (Days) 7 A FACE TO FACE EVALUATION WAS PERFORMED  Erick Colacendrew E Kirsteins 02/24/2018 7:27 AM

## 2018-02-24 NOTE — Progress Notes (Signed)
Occupational Therapy Session Note  Patient Details  Name: Jocelyn [name] MRN: [id number] Date of Birth: February 13, 1966  Today's Date: 02/24/2018  Session 1 OT Individual Time: 1000-1100 OT Individual Time Calculation (min): 60 min   Session 2 OT Individual Time: 5176-1607 OT Individual Time Calculation (min): 72 min   Short Term Goals: Week 1:  OT Short Term Goal 1 (Week 1): Pt will recall hemi-dressing techniques 2 consecutive days with min questioning cues OT Short Term Goal 2 (Week 1): Pt will complete 1/4 steps of toileting task OT Short Term Goal 3 (Week 1): Pt will maintain static standing balance for 1 minutes with min A at RW/ sink in prep for functional task  Skilled Therapeutic Interventions/Progress Updates:  Session 1   Pt greeted seated in wc with spouse present and agreeable to OT. Reviewed safe toilet transfer with pt and spouse including steps of toileting. Spouse then assisted pt with stand-pivot transfer into shower. Provided pt with wash mit and incorporated L NMR with weight bearing while washing body parts. Pt with improved L shoulder, scapula, triceps, and biceps activation today. Mod A for standing balance while washing bottom with 1 lateral LOB requiring Max A to correct. Pt slow with dressing tasks today and needed increased time for each activity. Min instructional cues to recall hemi-techniques for UB dressing. L NMR with weight bearing in standing when pulling up pants. Educated pt on ways to use L UE as a stabilizer to hold toothbrush and grooming objects. Pt left seated at the sink with spouse present finishing grooming tasks.   Session 2 Pt greeted in bed and agreeable to OT. Stand-pivot to wc with min A and assistance from spouse. Spouse did a good job of cuing pt for LLE placement prior to transfer. Pt brought to therapy apartment and educated on tub bench transfer with OT demonstration. Pt needed min A and mod A to lift LE in and out of tub. Pt's spouse  assisted with transfer. Applied 1:1 NMES to CH1 supraspinatus and middle deltoid to help approximate shoulder joint, and Ch 2 wrist extensors.  Ratio 1:1 Rate 35 pps Waveform- Asymmetric Ramp 1.0 Pulse 300 CH1 Intensity- 15  Duration -  15  CH1 Intensity- 20 Duration -  15  Continued to work on L NMR in gravity eliminated side-lying position. Worked on shoulder/scap/triceps/biceps using Hand roller. Pt returned to room and left seated in wc with spouse present and needs met.   Therapy Documentation Precautions:  Precautions Precautions: Fall Precaution Comments: L hemiplegia  Required Braces or Orthoses: Other Brace/Splint Other Brace/Splint: L AFO Restrictions Weight Bearing Restrictions: No Pain:  none/denies pain ADL: ADL ADL Comments: Please see functional navigator  See Function Navigator for Current Functional Status.   Therapy/Group: Individual Therapy  Valma Cava 02/24/2018, 3:29 PM

## 2018-02-25 ENCOUNTER — Inpatient Hospital Stay (HOSPITAL_COMMUNITY): Payer: 59 | Admitting: Physical Therapy

## 2018-02-25 ENCOUNTER — Inpatient Hospital Stay (HOSPITAL_COMMUNITY): Payer: 59 | Admitting: Occupational Therapy

## 2018-02-25 MED ORDER — DICLOFENAC SODIUM 1 % TD GEL
2.0000 g | Freq: Four times a day (QID) | TRANSDERMAL | Status: DC
  Administered 2018-02-25 – 2018-03-09 (×31): 2 g via TOPICAL
  Filled 2018-02-25: qty 100

## 2018-02-25 NOTE — Progress Notes (Addendum)
[facility] PH[facility] MEDICINE & REHABILITATION     PROGRESS NOTE  Subjective/Complaints:   Left hip pain., no fall or injury, no prior hx  ROS: Denies CP, SOB, nausea, vomiting diarrhea.  Objective: Vital Signs: Blood pressure 122/79, pulse 90, temperature 97.8 F (36.6 C), temperature source Oral, resp. rate 18, height 5\' 7"  (1.702 m), weight 84 kg (185 lb 3 oz), SpO2 100 %. No results found. No results for input(s): WBC, HGB, HCT, PLT in the last 72 hours. Recent Labs    02/24/18 1012  NA 140  K 3.7  CL 105  GLUCOSE 120*  BUN 13  CREATININE 0.77  CALCIUM 9.5   CBG (last 3)  No results for input(s): GLUCAP in the last 72 hours.  Wt Readings from Last 3 Encounters:  02/23/18 84 kg (185 lb 3 oz)  02/13/18 81.2 kg (179 lb 0.2 oz)  07/21/17 80.3 kg (177 lb)    Physical Exam:  BP 122/79 (BP Location: Left Arm)   Pulse 90   Temp 97.8 F (36.6 C) (Oral)   Resp 18   Ht 5\' 7"  (1.702 m)   Wt 84 kg (185 lb 3 oz)   SpO2 100%   BMI 29.00 kg/m  Constitutional: She appears well-developed and well-nourished. NAD. HENT: Normocephalic and atraumatic.  Eyes: EOM are normal. No discharge.  Cardiovascular: Normal rate and regular rhythm. No JVD. Respiratory: No respiratory distress. Clear. GI: Bowel sounds are normal. She exhibits no distension.  Musculoskeletal: She exhibits no edema + Left lateral hip tenderness.    Neurological: She is alert and oriented Soft voice.  Left facial weakness.  Left inattention noted.  Able to follow simple one step commands without difficulty.  Motor: LUE: trace finger flexors and biceps LLE: HF 2+/5, and KE Skin: Skin is warm and dry.  Psychiatric: She has a normal mood and affect. Her behavior is normal.   Assessment/Plan: 1. 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 Physiatrist is providing close team supervision  and 24 hour management of active medical problems listed below. Physiatrist and rehab team continue to assess barriers to discharge/monitor patient progress toward functional and medical goals.  Function:  Bathing Bathing position Bathing activity did not occur: N/A Position: Shower  Bathing parts Body parts bathed by patient: Left arm, Abdomen, Chest, Front perineal area, Right upper leg, Left upper leg, Left lower leg, Right lower leg Body parts bathed by helper: Buttocks, Back, Right arm  Bathing assist Assist Level: Touching or steadying assistance(Pt > 75%)      Upper Body Dressing/Undressing Upper body dressing   What is the patient wearing?: Pull over shirt/dress, Bra Bra - Perfomed by patient: Thread/unthread right bra strap Bra - Perfomed by helper: Thread/unthread left bra strap, Hook/unhook bra (pull down sports bra) Pull over shirt/dress - Perfomed by patient: Thread/unthread right sleeve, Put head through opening, Pull shirt over trunk Pull over shirt/dress - Perfomed by helper: Thread/unthread left sleeve        Upper body assist Assist Level: Touching or steadying assistance(Pt > 75%)      Lower Body Dressing/Undressing Lower body dressing   What is the patient wearing?: Underwear, Pants Underwear - Performed by patient: Thread/unthread right underwear leg Underwear - Performed by helper: Thread/unthread left underwear leg, Pull underwear up/down Pants- Performed by patient: Pull pants up/down, Thread/unthread right pants leg Pants- Performed by helper: Thread/unthread right pants leg, Thread/unthread  left pants leg, Pull pants up/down   Non-skid slipper socks- Performed by helper: Don/doff right sock, Don/doff left sock                  Lower body assist Assist for lower body dressing: Touching or steadying assistance (Pt > 75%)      Toileting Toileting Toileting activity did not occur: No continent bowel/bladder event Toileting steps completed by  patient: Performs perineal hygiene, Adjust clothing prior to toileting Toileting steps completed by helper: Adjust clothing after toileting Toileting Assistive Devices: Grab bar or rail  Toileting assist Assist level: Touching or steadying assistance (Pt.75%)   Transfers Chair/bed transfer   Chair/bed transfer method: Stand pivot, Ambulatory Chair/bed transfer assist level: Touching or steadying assistance (Pt > 75%) Chair/bed transfer assistive device: Armrests, Patent attorneyWalker     Locomotion Ambulation     Max distance: 30' Assist level: Touching or steadying assistance (Pt > 75%)   Wheelchair   Type: Manual Max wheelchair distance: 150' Assist Level: Supervision or verbal cues  Cognition Comprehension Comprehension assist level: Follows complex conversation/direction with extra time/assistive device  Expression Expression assist level: Expresses complex ideas: With extra time/assistive device  Social Interaction Social Interaction assist level: Interacts appropriately with others with medication or extra time (anti-anxiety, antidepressant).  Problem Solving Problem solving assist level: Solves complex problems: With extra time  Memory Memory assist level: Complete Independence: No helper     Medical Problem List and Plan: 1.  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   CIR PT, OT, SLP - discussed good progress with pt and husband 2.  DVT Prophylaxis/Anticoagulation: Mechanical: Sequential compression devices, below knee Bilateral lower extremities 3. Pain Management: N/A. Left troch bursitis add voltaren 4. Mood: LCSW to follow for evaluation and support.  5. Neuropsych: This patient is not fully capable of making decisions on her own behalf. 6. Skin/Wound Care:  Routine pressure relief measures.  7. Fluids/Electrolytes/Nutrition: Monitor I/Os.    4/19 normal BMET      8. HTN: Monitor BP bid. Continue Norvasc. Titrate  Prinivil upwards for tighter control.     Vitals:   02/24/18 1410 02/25/18 0615  BP: 115/78 122/79  Pulse: 70 90  Resp: 16 18  Temp: 98.5 F (36.9 C) 97.8 F (36.6 C)  SpO2:  100%  controlled 4/20 9.  Acute on chronic anemia: Continue iron supplement. Per pt not compliant at home with this   Hemoglobin 9.1 on 4/13  stool guaic,4/18 negative  LOS (Days) 8 A FACE TO FACE EVALUATION WAS PERFORMED  Erick Colacendrew E Zanyiah Posten 02/25/2018 9:40 AM

## 2018-02-25 NOTE — Progress Notes (Signed)
Physical Therapy Session Note  Patient Details  Name: Jocelyn [name] MRN: [id number] Date of Birth: 1966-06-24  Today's Date: 02/25/2018 PT Individual Time: 9432-7614 PT Individual Time Calculation (min): 60 min   Short Term Goals: Week 1:  PT Short Term Goal 1 (Week 1): Pt will perform stand pivot transfer with min assist PT Short Term Goal 1 - Progress (Week 1): Met PT Short Term Goal 2 (Week 1): Pt will ambulate x 50 ft with min assist and LRAD PT Short Term Goal 2 - Progress (Week 1): Progressing toward goal PT Short Term Goal 3 (Week 1): Pt will initiate stair training for funtional strengthening PT Short Term Goal 3 - Progress (Week 1): Met Week 2:  PT Short Term Goal 1 (Week 2): Pt will transfer bed<>chair w/ supervision  PT Short Term Goal 2 (Week 2): Pt will demonstrate safety awareness w/ all functional mobility 100% of the time PT Short Term Goal 3 (Week 2): Pt will maintain dynamic standing balance w/ min guard PT Short Term Goal 4 (Week 2): Pt will self-propel w/c in household environment w/ supervision Week 3:     Skilled Therapeutic Interventions/Progress Updates:   Pt received supine in bed and agreeable to PT. Supine>sit transfer with supervision  assist . Pt reports pain in L hip. Squat pivot transfers to [facility]. PT transported to rehab gym. Squat pivot transfer to bed with min assist and cues for sequencing. Bed mobilty into supine with supervision assist .  Supine NMR. SAQ, ankle pumps, hip add/abd, heel slides, reciprocal hip flexion SLR, bridge. All completed x12 BLE with min cues for technique and speed to improve control.  Manual therapy for lateral hip pain. Trigger point deep tissue massage in TFL and IT band 4 x 45 sec each. PNF contract relax stretch for HS and quads and Glute med. 20 sec hold and 6 sec contract.   Patient returned to room and left sitting in [facility] with call bell in reach and all needs met.          Therapy Documentation Precautions:   Precautions Precautions: Fall Precaution Comments: L hemiplegia  Required Braces or Orthoses: Other Brace/Splint Other Brace/Splint: L AFO Restrictions Weight Bearing Restrictions: No Vital Signs: Therapy Vitals Temp: 98.6 F (37 C) Temp Source: Oral Pulse Rate: 71 Resp: 17 BP: 118/74 Patient Position (if appropriate): Lying Oxygen Therapy SpO2: 100 % O2 Device: Room Air Pain: 4/10 L hip   See Function Navigator for Current Functional Status.   Therapy/Group: Individual Therapy  Lorie Phenix 02/25/2018, 6:04 PM

## 2018-02-25 NOTE — Progress Notes (Signed)
Occupational Therapy Session Note  Patient Details  Name: Jocelyn [name] MRN: [id number] Date of Birth: Feb 13, 1966  Today's Date: 02/25/2018 OT Individual Time: 1045-1130 OT Individual Time Calculation (min): 45 min    Short Term Goals: Week 1:  OT Short Term Goal 1 (Week 1): Pt will recall hemi-dressing techniques 2 consecutive days with min questioning cues OT Short Term Goal 2 (Week 1): Pt will complete 1/4 steps of toileting task OT Short Term Goal 3 (Week 1): Pt will maintain static standing balance for 1 minutes with min A at RW/ sink in prep for functional task  Skilled Therapeutic Interventions/Progress Updates:    Pt seen for BADL training with a focus on transfers, LUE management, balance. Pt received in bed and did need to toilet. She worked on a squat pivot to the L to w/c with steadying A and then to the toilet with min/mod A to her L with a stand pivot. Pt had been c/o hip pain and when pt transfers she tends to try to sit before having her hips/ feet lined up so she puts undue pressure on her L hip. Educated pt and practiced with her actively stepping foot out to side for improved foot alignment.  She was able to complete clothing management off her hips and cleansing with steadying A.   Due to limited time this session, pt did a quick sponge bath while seat on the toilet.  Min A to wash R arm. Pt is lifting L arm actively to 80 degrees.  She transferred back to w/c to dress.  Cues for donning shirt for effective technique to avoid pulling on her hair.  She needed increased assist with pants only because they were tight leggings.  Focused on control of L leg in standing for increased balance support.  Reviewed with pt LUE AROM exercises to complete for shoulder and elbow. Pt resting in w/c with lap tray on and all needs met.  Therapy Documentation Precautions:  Precautions Precautions: Fall Precaution Comments: L hemiplegia  Required Braces or Orthoses: Other  Brace/Splint Other Brace/Splint: L AFO Restrictions Weight Bearing Restrictions: No    Pain: Pain Assessment Pain Scale: 0-10 Pain Score: 4  Pain Type: Acute pain Pain Location: Hip Pain Orientation: Left Pain Descriptors / Indicators: Aching;Sore Pain Onset: On-going Pain Intervention(s): Repositioned;Heat applied;MD notified (Comment)(Dr. Kirsteins ) ADL: ADL ADL Comments: Please see functional navigator  See Function Navigator for Current Functional Status.   Therapy/Group: Individual Therapy  Felise Georgia 02/25/2018, 12:59 PM

## 2018-02-26 ENCOUNTER — Inpatient Hospital Stay (HOSPITAL_COMMUNITY): Payer: 59

## 2018-02-27 ENCOUNTER — Inpatient Hospital Stay (HOSPITAL_COMMUNITY): Payer: 59

## 2018-02-27 ENCOUNTER — Inpatient Hospital Stay (HOSPITAL_COMMUNITY): Payer: 59 | Admitting: Occupational Therapy

## 2018-02-27 NOTE — Progress Notes (Signed)
[place] PHYSICAL MEDICINE & REHABILITATION     PROGRESS NOTE  Subjective/Complaints:   No issues overnite, no L hip pain  ROS: Denies CP, SOB, nausea, vomiting diarrhea.  Objective: Vital Signs: Blood pressure 116/76, pulse 80, temperature 98.5 F (36.9 C), temperature source Oral, resp. rate 17, height 5\' 7"  (1.702 m), weight 84 kg (185 lb 3 oz), SpO2 100 %. No results found. No results for input(s): WBC, HGB, HCT, PLT in the last 72 hours. Recent Labs    02/24/18 1012  NA 140  K 3.7  CL 105  GLUCOSE 120*  BUN 13  CREATININE 0.77  CALCIUM 9.5   CBG (last 3)  No results for input(s): GLUCAP in the last 72 hours.  Wt Readings from Last 3 Encounters:  02/23/18 84 kg (185 lb 3 oz)  02/13/18 81.2 kg (179 lb 0.2 oz)  07/21/17 80.3 kg (177 lb)    Physical Exam:  BP 116/76 (BP Location: Right Arm)   Pulse 80   Temp 98.5 F (36.9 C) (Oral)   Resp 17   Ht 5\' 7"  (1.702 m)   Wt 84 kg (185 lb 3 oz)   SpO2 100%   BMI 29.00 kg/m  Constitutional: She appears well-developed and well-nourished. NAD. HENT: Normocephalic and atraumatic.  Eyes: EOM are normal. No discharge.  Cardiovascular: Normal rate and regular rhythm. No JVD. Respiratory: No respiratory distress. Clear. GI: Bowel sounds are normal. She exhibits no distension.  Musculoskeletal: She exhibits no edema + Left lateral hip tenderness.    Neurological: She is alert and oriented Soft voice.  Left facial weakness.  Left inattention noted.  Able to follow simple one step commands without difficulty.  Motor: LUE: trace finger flexors and biceps LLE: HF 2+/5, and KE Skin: Skin is warm and dry.  Psychiatric: She has a normal mood and affect. Her behavior is normal.   Assessment/Plan: 1. 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 Physiatrist is providing close team supervision and 24 hour  management of active medical problems listed below. Physiatrist and rehab team continue to assess barriers to discharge/monitor patient progress toward functional and medical goals.  Function:  Bathing Bathing position Bathing activity did not occur: N/A Position: Shower  Bathing parts Body parts bathed by patient: Left arm, Abdomen, Chest, Front perineal area, Right upper leg, Left upper leg, Left lower leg, Right lower leg Body parts bathed by helper: Buttocks, Back, Right arm  Bathing assist Assist Level: Touching or steadying assistance(Pt > 75%)      Upper Body Dressing/Undressing Upper body dressing   What is the patient wearing?: Pull over shirt/dress, Bra Bra - Perfomed by patient: Thread/unthread right bra strap Bra - Perfomed by helper: Thread/unthread left bra strap, Hook/unhook bra (pull down sports bra) Pull over shirt/dress - Perfomed by patient: Thread/unthread right sleeve, Put head through opening, Pull shirt over trunk Pull over shirt/dress - Perfomed by helper: Thread/unthread left sleeve        Upper body assist Assist Level: Touching or steadying assistance(Pt > 75%)      Lower Body Dressing/Undressing Lower body dressing   What is the patient wearing?: Underwear, Pants Underwear - Performed by patient: Thread/unthread right underwear leg Underwear - Performed by helper: Thread/unthread left underwear leg, Pull underwear up/down Pants- Performed by patient: Pull pants up/down, Thread/unthread right pants leg Pants- Performed by helper: Thread/unthread right pants leg, Thread/unthread left pants leg,  Pull pants up/down   Non-skid slipper socks- Performed by helper: Don/doff right sock, Don/doff left sock                  Lower body assist Assist for lower body dressing: Touching or steadying assistance (Pt > 75%)      Toileting Toileting Toileting activity did not occur: No continent bowel/bladder event Toileting steps completed by patient: Performs  perineal hygiene, Adjust clothing prior to toileting Toileting steps completed by helper: Adjust clothing after toileting Toileting Assistive Devices: Grab bar or rail  Toileting assist Assist level: Touching or steadying assistance (Pt.75%)   Transfers Chair/bed transfer   Chair/bed transfer method: Stand pivot, Ambulatory Chair/bed transfer assist level: Touching or steadying assistance (Pt > 75%) Chair/bed transfer assistive device: Armrests, Patent attorney     Max distance: 30' Assist level: Touching or steadying assistance (Pt > 75%)   Wheelchair   Type: Manual Max wheelchair distance: 150' Assist Level: Supervision or verbal cues  Cognition Comprehension Comprehension assist level: Follows complex conversation/direction with extra time/assistive device  Expression Expression assist level: Expresses complex ideas: With extra time/assistive device  Social Interaction Social Interaction assist level: Interacts appropriately with others with medication or extra time (anti-anxiety, antidepressant).  Problem Solving Problem solving assist level: Solves complex problems: With extra time  Memory Memory assist level: Complete Independence: No helper     Medical Problem List and Plan: 1.  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   CIR PT, OT, SLP - planned d/c 5/1    2.  DVT Prophylaxis/Anticoagulation: Mechanical: Sequential compression devices, below knee Bilateral lower extremities 3. Pain Management: N/A. Left troch bursitis improved with  voltaren 4. Mood: LCSW to follow for evaluation and support.  5. Neuropsych: This patient is not fully capable of making decisions on her own behalf. 6. Skin/Wound Care:  Routine pressure relief measures.  7. Fluids/Electrolytes/Nutrition: Monitor I/Os.    4/19 normal BMET      8. HTN: Monitor BP bid. Continue Norvasc. Titrate Prinivil upwards for tighter  control.     Vitals:   02/26/18 2027 02/27/18 0551  BP: 124/82 116/76  Pulse:  80  Resp:  17  Temp:  98.5 F (36.9 C)  SpO2:  100%  controlled 4/22 9.  Acute on chronic anemia: Continue iron supplement. Per pt not compliant at home with this   Hemoglobin 9.1 on 4/13  stool guaic,4/18 negative 10.  Constipation - d/w RN LOS (Days) 10 A FACE TO FACE EVALUATION WAS PERFORMED  Erick Colace 02/27/2018 8:06 AM

## 2018-02-27 NOTE — Progress Notes (Signed)
Attempted to get occult blood. unable to get stool sample due to patient voiding into stool.  Marylu LundMekides W Lenvil Swaim, LPN.

## 2018-02-27 NOTE — Progress Notes (Signed)
Occupational Therapy Weekly Progress Note  Patient Details  Name: Jocelyn [name] MRN: [id number] Date of Birth: January 26, 1966  Beginning of progress report period: February 18, 2018 End of progress report period: February 27, 2018  Today's Date: 02/27/2018 OT Individual Time: 1100-1200 OT Individual Time Calculation (min): 60 min    Patient has met 3 of 3 short term goals.  Pt is making steady progress with OT treatments at this time.  She is able to complete squat-pivot transfers with min A/supervision.  L UE function continues to improve with more shoulder activation and some finger flexion today. Min/Supervision for sit to to stand with min instructional cueing for hand placement and weight bearing through LLE.  Will continue with current OT treatment POC.    Patient continues to demonstrate the following deficits: muscle weakness, abnormal tone, unbalanced muscle activation, decreased coordination and decreased motor planning, decreased motor planning and decreased standing balance, decreased postural control, hemiplegia and decreased balance strategies and therefore will continue to benefit from skilled OT intervention to enhance overall performance with BADL.  Patient progressing toward long term goals..  Continue plan of care.  OT Short Term Goals Week 1:  OT Short Term Goal 1 (Week 1): Pt will recall hemi-dressing techniques 2 consecutive days with min questioning cues OT Short Term Goal 1 - Progress (Week 1): Met OT Short Term Goal 2 (Week 1): Pt will complete 1/4 steps of toileting task OT Short Term Goal 2 - Progress (Week 1): Met OT Short Term Goal 3 (Week 1): Pt will maintain static standing balance for 1 minutes with min A at RW/ sink in prep for functional task OT Short Term Goal 3 - Progress (Week 1): Met Week 2:  OT Short Term Goal 1 (Week 2): STG=LTG 2/2 ELOS  Skilled Therapeutic Interventions/Progress Updates:    Pt greeted semi-reclined in bed and agreeable to OT treatment  session. Pt requests to shower. Functional ambulation into bathroom using RW and no AFO- Overall Min/mod A with 1 lateral LOB requiring Max A to correct. Pt undressed seated on tub bench with min A. Assistance needed to don L wash mit, then pt able to integrate  LUE into bathing tasks for neuro-re-ed with improved shoulder and scap activation to assist today. Worked on dynamic balance when standing to was buttocks with min cues for LLE positioning prior to stand. Min A for dynamic balance. Dressing completed wc at the sink with good recall of hemi-techniques. Focus on L knee control and balance strategies when standing to pul up pants. Worked on one-handed strategies and use of L UE as a stabilizer while completing grooming tasks at the sink. Provided L UE NMR with joint input to bring shoulder/elbow/wrist/hand through full ROM. Had pt try to flex fingers, and she was able to today. Educated pt on self-ROM techniques and to keep working on making a fist. Pt left seated in wc with safety belt on and nursing present.   Therapy Documentation Precautions:  Precautions Precautions: Fall Precaution Comments: L hemiplegia  Required Braces or Orthoses: Other Brace/Splint Other Brace/Splint: L AFO Restrictions Weight Bearing Restrictions: No Pain:  none/denies pain ADL: ADL ADL Comments: Please see functional navigator  See Function Navigator for Current Functional Status.   Therapy/Group: Individual Therapy  Valma Cava 02/27/2018, 8:01 AM

## 2018-02-27 NOTE — Progress Notes (Signed)
Physical Therapy Session Note  Patient Details  Name: Jocelyn [name] MRN: [id number] Date of Birth: Apr 18, 1966  Today's Date: 02/27/2018 PT Individual Time: 0805-0900, 1300-1415 PT Individual Time Calculation (min): 55 min and 75 min  Short Term Goals: Week 2:  PT Short Term Goal 1 (Week 2): Pt will transfer bed<>chair w/ supervision  PT Short Term Goal 2 (Week 2): Pt will demonstrate safety awareness w/ all functional mobility 100% of the time PT Short Term Goal 3 (Week 2): Pt will maintain dynamic standing balance w/ min guard PT Short Term Goal 4 (Week 2): Pt will self-propel w/c in household environment w/ supervision  Skilled Therapeutic Interventions/Progress Updates:    Session 1: Pt supine in bed upon PT arrival, agreeable to therapy tx and denies pain. Pt transferred from supine>sitting EOB with min assist, worked on seated balance with supervision while RN administered meds. Pt reports needing to use bathroom. Pt ambulated from bed>bathroom x 10 ft with RW and mod assist, increased scissoring and LOB to the L this AM with verbal cues for wider BOS and awareness. Pt performed lower body dressing and donned shoes/L AFO with assist from therapist. Pt ambulated from bathroom>bed with RW and min assist, verbal cues for L foot placement. Pt performed sit<>stands from toilet and from bed this session with noted compensations using R side>L. Pt performed 2 x 20 sit<>stands from bed mass practice with emphasis on symmetric weightbearing through LEs, no UE support to push up, min assist from therapist. Pt performed 2 x 10 LAQ with L LE, emphasis on isometric hold at end of range and eccentric control with lowering for neuro re-ed. Pt transferred sitting>supine with supervision and left supine in bed with needs in reach.   Session 2:  Pt seated in w/c upon PT arrival, agreeable to therapy tx and denies pain. Pt transported to dayroom. Pt ambulated from w/c>kinetron with RW and min assist, 10 ft  using L AFO. Pt used kinetron this session x 20 minutes for neuro re-ed working on sit<>stands with symmetric weightbearing, sit<>stands with increased weightbearing through L LE, alternating stepping in place, and loading of L LE in standing. Pt ambulated from kinetron>w/c in dayroom x 3330ft with RW and min assist, verbal cues for increased R step length and wider step with L LE. Pt transported to gym. Pt transferred from sit>stand>tall kneeling on mat with UE support on bench and mod assist to get into position. Pt in tall kneeling position for neuro re-ed and hip extensor strengthening. Pt in modified quadruped on elbows using bench for NMR and weight bearing through L shoulder, performed x 5 hip extension with each LE. Pt transferred back to standing and then sitting in w/c mod assist. Pt performed stand pivot to mat with min assist. Pt performed 2 x 10 sit<>stands with emphasis on techniques, L LE loading and symmetric weightbearing, no UE support and ace wrap applied to L knee for increased proprioceptive input. Pt performed x 10 sit<>stands with 2 inch step under R LE for increased loading/weightbearing through L LE for strengthening and NMR. Pt performed stand pivot back to w/c with min assist, left seated in w/c in room with QRB in place and chair alarm set.   Therapy Documentation Precautions:  Precautions Precautions: Fall Precaution Comments: L hemiplegia  Required Braces or Orthoses: Other Brace/Splint Other Brace/Splint: L AFO Restrictions Weight Bearing Restrictions: No   See Function Navigator for Current Functional Status.   Therapy/Group: Individual Therapy  Cresenciano GenreEmily van Schagen, PT,  DPT    02/27/2018, 7:43 AM

## 2018-02-28 ENCOUNTER — Inpatient Hospital Stay (HOSPITAL_COMMUNITY): Payer: 59

## 2018-02-28 ENCOUNTER — Inpatient Hospital Stay (HOSPITAL_COMMUNITY): Payer: 59 | Admitting: Physical Therapy

## 2018-02-28 ENCOUNTER — Inpatient Hospital Stay (HOSPITAL_COMMUNITY): Payer: 59 | Admitting: Occupational Therapy

## 2018-02-28 DIAGNOSIS — K5901 Slow transit constipation: Secondary | ICD-10-CM

## 2018-02-28 DIAGNOSIS — M7062 Trochanteric bursitis, left hip: Secondary | ICD-10-CM

## 2018-02-28 NOTE — Progress Notes (Signed)
Occupational Therapy Session Note  Patient Details  Name: Jocelyn [name] MRN: [id number] Date of Birth: August 23, 1966  Today's Date: 02/28/2018 OT Individual Time: 1100-1200 OT Individual Time Calculation (min): 60 min   Short Term Goals: Week 2:  OT Short Term Goal 1 (Week 2): STG=LTG 2/2 ELOS  Skilled Therapeutic Interventions/Progress Updates:    Pt greeted semi-reclined in bed and agreeable to OT. Pt requests to sponge bathe today. Stand-pivot to wc on L side with supervision. Worked on dynamic standing balance within bathing tasks and facilitated weight bearing through L UE in standing for neuro nre-red. Min guard A for balance with intermittent tactile cues to maintain L hip/knee extension. Verbal cues to recall hemi-dressing techniques to thread L UE through sleeve. Pt brought to therapy gym for L UE neuro re-ed. Utilized standing frame for increased weight bearing and towel pushes in standing. Worked on cup stacking with hand over hand A to facilitate functional grasp, then pt able to assist with advancing L UE towards stack of cups. Pt returned to room at end of session and left seated in wc with safety belt on and needs met.   Therapy Documentation Precautions:  Precautions Precautions: Fall Precaution Comments: L hemiplegia  Required Braces or Orthoses: Other Brace/Splint Other Brace/Splint: L AFO Restrictions Weight Bearing Restrictions: No Pain:  none/denies pain ADL: ADL ADL Comments: Please see functional navigator  See Function Navigator for Current Functional Status.   Therapy/Group: Individual Therapy  Valma Cava 02/28/2018, 11:24 AM

## 2018-02-28 NOTE — Progress Notes (Signed)
Physical Therapy Session Note  Patient Details  Name: Jocelyn [name] MRN: [id number] Date of Birth: Sep 06, 1966  Today's Date: 02/28/2018 PT Individual Time: 1000-1100 PT Individual Time Calculation (min): 60 min   Short Term Goals: Week 2:  PT Short Term Goal 1 (Week 2): Pt will transfer bed<>chair w/ supervision  PT Short Term Goal 2 (Week 2): Pt will demonstrate safety awareness w/ all functional mobility 100% of the time PT Short Term Goal 3 (Week 2): Pt will maintain dynamic standing balance w/ min guard PT Short Term Goal 4 (Week 2): Pt will self-propel w/c in household environment w/ supervision  Skilled Therapeutic Interventions/Progress Updates:    Pt supine in bed upon PT arrival, agreeable to therapy tx and denies pain. Pt transferred to sitting with supervision and completed lower body dressing, min assist for sit<>stand and supervision for standing balance while performing clothing management. Therapist donned shoes and L AFO for time management. Pt performed stand pivot transfer from bed>w/c with min assist. Pt transported to gym. Pt performed stand pivot transfer from w/c<>mat stand pivot with min assist. Pt performed multiple sit<>stands throughout session for various standing activities and transfers, emphasis on set up, techniques and symmetric weightbearing without pushing through UEs, min assist. Pt performed pre-gait forward/retro stepping in place with each LE, no UE support, emphasis on knee control during stance with L LE and increased step length during swing with R LE. Pt worked on dynamic standing balance in staggered stance with L LE forward while performing overhead reaching task to shift weight over L LE, x 2 trials. Pt ambulated x 15 ft without AD and mod assist, emphasis on gait mechanics including L stance control. Pt transported back to room and transferred to bed with min assist stand pivot. Pt left supine in bed with needs in reach and bed alarm set.   Therapy  Documentation Precautions:  Precautions Precautions: Fall Precaution Comments: L hemiplegia  Required Braces or Orthoses: Other Brace/Splint Other Brace/Splint: L AFO Restrictions Weight Bearing Restrictions: No   See Function Navigator for Current Functional Status.   Therapy/Group: Individual Therapy  Cresenciano GenreEmily van Schagen, PT, DPT 02/28/2018, 9:35 AM

## 2018-02-28 NOTE — Progress Notes (Signed)
[facility]     PROGRESS NOTE  Subjective/Complaints:  Pt seen sitting up at EOB this AM, working with therapies.  She states she slept well overnight.   ROS: Denies CP, SOB, nausea, vomiting diarrhea.  Objective: Vital Signs: Blood pressure 114/69, pulse 67, temperature 98.4 F (36.9 C), temperature source Oral, resp. rate 15, height 5\' 7"  (1.702 m), weight 84 kg (185 lb 3 oz), SpO2 98 %. No results found. No results for input(s): WBC, HGB, HCT, PLT in the last 72 hours. No results for input(s): NA, K, CL, GLUCOSE, BUN, CREATININE, CALCIUM in the last 72 hours.  Invalid input(s): CO CBG (last 3)  No results for input(s): GLUCAP in the last 72 hours.  Wt Readings from Last 3 Encounters:  02/23/18 84 kg (185 lb 3 oz)  02/13/18 81.2 kg (179 lb 0.2 oz)  07/21/17 80.3 kg (177 lb)    Physical Exam:  BP 114/69 (BP Location: Right Arm)   Pulse 67   Temp 98.4 F (36.9 C) (Oral)   Resp 15   Ht 5\' 7"  (1.702 m)   Wt 84 kg (185 lb 3 oz)   SpO2 98%   BMI 29.00 kg/m  Constitutional: She appears well-developed and well-nourished. NAD. HENT: Normocephalic and atraumatic.  Eyes: EOM are normal. No discharge.  Cardiovascular: RRR. No JVD. Respiratory: No respiratory distress. Clear. GI: Bowel sounds are normal. She exhibits no distension.  Musculoskeletal: She exhibits no edema, no tenderness. Neurological: She is alert and oriented Soft voice.  Left facial weakness.  Able to follow simple one step commands without difficulty.  Motor:  LUE: 2-/5 shoulder abduction, elbow flex/ext, 0/5 distally  LLE: HF 3-/5, and KE, 0/5 ADF Skin: Skin is warm and dry.  Psychiatric: She has a normal mood and affect. Her behavior is normal.   Assessment/Plan: 1. 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 Physiatrist is providing close team  supervision and 24 hour management of active medical problems listed below. Physiatrist and rehab team continue to assess barriers to discharge/monitor patient progress toward functional and medical goals.  Function:  Bathing Bathing position Bathing activity did not occur: N/A Position: Shower  Bathing parts Body parts bathed by patient: Left arm, Abdomen, Chest, Front perineal area, Right upper leg, Left upper leg, Left lower leg, Right lower leg, Buttocks Body parts bathed by helper: Right arm  Bathing assist Assist Level: Touching or steadying assistance(Pt > 75%)      Upper Body Dressing/Undressing Upper body dressing   What is the patient wearing?: Pull over shirt/dress, Bra Bra - Perfomed by patient: Thread/unthread right bra strap Bra - Perfomed by helper: Thread/unthread left bra strap, Hook/unhook bra (pull down sports bra) Pull over shirt/dress - Perfomed by patient: Thread/unthread left sleeve, Thread/unthread right sleeve, Put head through opening Pull over shirt/dress - Perfomed by helper: Pull shirt over trunk        Upper body assist Assist Level: Touching or steadying assistance(Pt > 75%)      Lower Body Dressing/Undressing Lower body dressing   What is the patient wearing?: Underwear, Pants Underwear - Performed by patient: Thread/unthread right underwear leg Underwear - Performed by helper: Thread/unthread left underwear leg, Pull underwear up/down Pants- Performed by patient: Pull pants up/down, Thread/unthread right pants leg Pants- Performed by helper: Thread/unthread right pants leg, Thread/unthread left pants leg, Pull pants up/down   Non-skid slipper socks-  Performed by helper: Don/doff right sock, Don/doff left sock                  Lower body assist Assist for lower body dressing: Touching or steadying assistance (Pt > 75%)      Toileting Toileting Toileting activity did not occur: No continent bowel/bladder event Toileting steps completed by  patient: Adjust clothing prior to toileting, Performs perineal hygiene Toileting steps completed by helper: Adjust clothing after toileting Toileting Assistive Devices: Grab bar or rail  Toileting assist Assist level: Touching or steadying assistance (Pt.75%)   Transfers Chair/bed transfer   Chair/bed transfer method: Stand pivot Chair/bed transfer assist level: Touching or steadying assistance (Pt > 75%) Chair/bed transfer assistive device: Armrests     Locomotion Ambulation     Max distance: 15 ft Assist level: Moderate assist (Pt 50 - 74%)   Wheelchair   Type: Manual Max wheelchair distance: 150' Assist Level: Supervision or verbal cues  Cognition Comprehension Comprehension assist level: Follows complex conversation/direction with extra time/assistive device  Expression Expression assist level: Expresses complex ideas: With extra time/assistive device  Social Interaction Social Interaction assist level: Interacts appropriately with others with medication or extra time (anti-anxiety, antidepressant).  Problem Solving Problem solving assist level: Solves complex problems: With extra time  Memory Memory assist level: Complete Independence: No helper     Medical Problem List and Plan: 1.  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   Cont CIR  2.  DVT Prophylaxis/Anticoagulation: Mechanical: Sequential compression devices, below knee Bilateral lower extremities 3. Pain Management: N/A. Left troch bursitis improved with voltaren 4. Mood: LCSW to follow for evaluation and support.  5. Neuropsych: This patient is not fully capable of making decisions on her own behalf. 6. Skin/Wound Care:  Routine pressure relief measures.  7. Fluids/Electrolytes/Nutrition: Monitor I/Os.    4/19 within acceptable range on BMET       8. HTN: Monitor BP bid. Continue Norvasc. Titrate Prinivil upwards for tighter control.     Vitals:    02/28/18 0530 02/28/18 0735  BP: 133/81 114/69  Pulse: 81 67  Resp: 16 15  Temp: 98.7 F (37.1 C) 98.4 F (36.9 C)  SpO2: 98% 98%   Controlled 4/23 9.  Acute on chronic anemia: Continue iron supplement. Per pt not compliant at home with this   Hemoglobin 9.3 on 4/17  stool guaic,4/18 negative 10.  Constipation -    Adjust bowel reg as necessary  LOS (Days) 11 A FACE TO FACE EVALUATION WAS PERFORMED  Ankit Karis Juba 02/28/2018 10:42 AM

## 2018-02-28 NOTE — Progress Notes (Signed)
Physical Therapy Session Note  Patient Details  Name: Jocelyn [name] MRN: [id number] Date of Birth: [birth year]  Today's Date: 02/28/2018 PT Individual Time: 1300-1413 PT Individual Time Calculation (min): 73 min   Short Term Goals: Week 2:  PT Short Term Goal 1 (Week 2): Pt will transfer bed<>chair w/ supervision  PT Short Term Goal 2 (Week 2): Pt will demonstrate safety awareness w/ all functional mobility 100% of the time PT Short Term Goal 3 (Week 2): Pt will maintain dynamic standing balance w/ min guard PT Short Term Goal 4 (Week 2): Pt will self-propel w/c in household environment w/ supervision  Skilled Therapeutic Interventions/Progress Updates:   Pt in supine and agreeable to therapy, requesting to toilet. Transferred to/from toilet via stand pivot from w/c w/ min assist for time management as pt stated it was an emergency. Min assist for LE garment management and set-up assist for pericare. Practiced donning socks, shoes, and LAFO w/o assist, able to get everything on except last bit of putting on LAFO and wrapping straps around calves. Pt self-propelled w/c to gym to practice w/ R hemi technique. Worked on gait training in gym w/ GRAFO vs posterior leaf spring AFO on L side to assist w/ L quad control. L knee extension thrust much improved w/ posterior leaf spring, occurs 1/10 steps vs w/ every step when using ground reaction AFO. Practiced ambulating w/ LAFO using both RW, quad cane, and w/o AD. Min guard overall for safety and improved balance and L quad control when she does have RUE support on AD to compensate. Ambulated up to 100' w/ quad cane w/ occasional verbal cues for step-to pattern and for increased BOS to allow for RLE step. Will continue to assess use of LRAD for gait. Returned to room and ended session in supine, call bell within reach and all needs met.   Therapy Documentation Precautions:  Precautions Precautions: Fall Precaution Comments: L hemiplegia  Required  Braces or Orthoses: Other Brace/Splint Other Brace/Splint: L AFO Restrictions Weight Bearing Restrictions: No Pain: Pain Assessment Pain Scale: 0-10 Pain Score: 0-No pain  See Function Navigator for Current Functional Status.   Therapy/Group: Individual Therapy  [name] 02/28/2018, 2:15 PM

## 2018-03-01 ENCOUNTER — Inpatient Hospital Stay (HOSPITAL_COMMUNITY): Payer: 59

## 2018-03-01 ENCOUNTER — Inpatient Hospital Stay (HOSPITAL_COMMUNITY): Payer: 59 | Admitting: Occupational Therapy

## 2018-03-01 ENCOUNTER — Inpatient Hospital Stay (HOSPITAL_COMMUNITY): Payer: 59 | Admitting: *Deleted

## 2018-03-01 DIAGNOSIS — E876 Hypokalemia: Secondary | ICD-10-CM

## 2018-03-01 LAB — BASIC METABOLIC PANEL
Anion gap: 9 (ref 5–15)
BUN: 11 mg/dL (ref 6–20)
CHLORIDE: 105 mmol/L (ref 101–111)
CO2: 25 mmol/L (ref 22–32)
Calcium: 9 mg/dL (ref 8.9–10.3)
Creatinine, Ser: 0.73 mg/dL (ref 0.44–1.00)
GFR calc non Af Amer: >60 mL/min (ref >60)
GLUCOSE: 90 mg/dL (ref 65–99)
Potassium: 3.4 mmol/L — ABNORMAL LOW (ref 3.5–5.1)
Sodium: 139 mmol/L (ref 135–145)

## 2018-03-01 MED ORDER — POTASSIUM CHLORIDE CRYS ER 20 MEQ PO TBCR
40.0000 meq | EXTENDED_RELEASE_TABLET | Freq: Two times a day (BID) | ORAL | Status: AC
  Administered 2018-03-01 (×2): 40 meq via ORAL
  Filled 2018-03-01 (×2): qty 2

## 2018-03-01 NOTE — Progress Notes (Signed)
Orthopedic Tech Progress Note Patient Details:  Jocelyn [name] November 22, 1965 [id number]  Patient ID: Jocelyn [name], female   DOB: November 22, 1965, 52 y.o.   MRN: [id number]   Jocelyn [name], Jocelyn [name] 03/01/2018, 12:52 PM Called in hanger brace order; spoke with Morrie SheldonAshley

## 2018-03-01 NOTE — Progress Notes (Signed)
Occupational Therapy Session Note  Patient Details  Name: Jocelyn [name] MRN: [id number] Date of Birth: 1966-09-25  Today's Date: 03/01/2018 OT Individual Time: 5009-3818; 1300-1400 OT Individual Time Calculation (min): 60 min ; 60 min   Short Term Goals: Week 2:  OT Short Term Goal 1 (Week 2): STG=LTG 2/2 ELOS  Skilled Therapeutic Interventions/Progress Updates:    Treatment session 1 focused on ADLs/self care training, transfer training, NMR, balance training, and pt education. Upon entering room, pt agreeable to AM ADLs. Pt bed mob with CGA and completed bed>w/c>toilet tx with min A. Pt noted to have decreased awareness to transfers to L side and poor balance with L LE. Pt educated on wide base of support during transfers and leading with R side. Pt completed w/c<>shower tx with min A and v/c for hand/foot placement. Pt completed d/b with hemi plegic dressing techniques. L UE hand over hand completed for bathing tasks for NMR. Pt required hand over hand for L UE simulation for set up during grooming tasks. Pt instructed on safety awareness during ADLs and transfers to increase function and safety. No c/o pain throughout session. Pt left resting in room with all needs met.   Treatment session 2 focused on L UE NMR with joint compression, traction, and facilitation techniques to scapular region, bicep/tricep, forearm, and hand to activate muscle contraction/relaxation. Pt participated in hand over hand weight bearing facilitation in seated at table top using modalities including wash cloth and thera-putty for rotational movement with B UE. Pt completed standing tolerance activities at table top with L UE for WB to follow sequencing pattern. Tolerated standing for 5 minutes with v/c and t/c for L knee blocking and foot placement for WBS. No c/o pain, returned to room via w/c with needs met to rest.   Therapy Documentation Precautions:  Precautions Precautions: Fall Precaution Comments: L  hemiplegia  Required Braces or Orthoses: Other Brace/Splint Other Brace/Splint: L AFO Restrictions Weight Bearing Restrictions: No Pain: Pain Assessment Pain Scale: 0-10 ADL: ADL ADL Comments: Please see functional navigator  See Function Navigator for Current Functional Status.   Therapy/Group: Individual Therapy  Delon [place] 03/01/2018, 12:52 PM

## 2018-03-01 NOTE — Plan of Care (Signed)
Problem: Consults Goal: RH STROKE PATIENT EDUCATION Description See Patient Education module for education specifics  Outcome: Progressing   Problem: RH BOWEL ELIMINATION Goal: RH STG MANAGE BOWEL WITH ASSISTANCE Description STG Manage Bowel with mod I.  Outcome: Progressing Goal: RH STG MANAGE BOWEL W/MEDICATION W/ASSISTANCE Description STG Manage Bowel with Medication with mod I.  Outcome: Progressing   Problem: RH BLADDER ELIMINATION Goal: RH STG MANAGE BLADDER WITH ASSISTANCE Description STG Manage Bladder With mod i  Outcome: Progressing Goal: RH STG MANAGE BLADDER WITH MEDICATION WITH ASSISTANCE Description STG Manage Bladder With Medication With mod I.  Outcome: Progressing   Problem: RH SKIN INTEGRITY Goal: RH STG SKIN FREE OF INFECTION/BREAKDOWN Outcome: Progressing Goal: RH STG MAINTAIN SKIN INTEGRITY WITH ASSISTANCE Description STG Maintain Skin Integrity With mod i.  Outcome: Progressing   Problem: Consults Goal: RH STROKE PATIENT EDUCATION Description See Patient Education module for education specifics  Outcome: Progressing   Problem: RH BOWEL ELIMINATION Goal: RH STG MANAGE BOWEL WITH ASSISTANCE Description STG Manage Bowel with mod I.  Outcome: Progressing Goal: RH STG MANAGE BOWEL W/MEDICATION W/ASSISTANCE Description STG Manage Bowel with Medication with mod I.  Outcome: Progressing   Problem: RH BLADDER ELIMINATION Goal: RH STG MANAGE BLADDER WITH ASSISTANCE Description STG Manage Bladder With mod i  Outcome: Progressing Goal: RH STG MANAGE BLADDER WITH MEDICATION WITH ASSISTANCE Description STG Manage Bladder With Medication With mod I.  Outcome: Progressing   Problem: RH SKIN INTEGRITY Goal: RH STG SKIN FREE OF INFECTION/BREAKDOWN Outcome: Progressing Goal: RH STG MAINTAIN SKIN INTEGRITY WITH ASSISTANCE Description STG Maintain Skin Integrity With mod i.  Outcome: Progressing   Problem: RH SAFETY Goal: RH STG ADHERE TO SAFETY  PRECAUTIONS W/ASSISTANCE/DEVICE Description STG Adhere to Safety Precautions With min Assistance/Device.  Outcome: Progressing Goal: RH STG DECREASED RISK OF FALL WITH ASSISTANCE Description STG Decreased Risk of Fall With min Assistance.  Outcome: Progressing   Problem: RH COGNITION-NURSING Goal: RH STG ANTICIPATES NEEDS/CALLS FOR ASSIST W/ASSIST/CUES Description STG Anticipates Needs/Calls for Assist With min Assistance/Cues.  Outcome: Progressing   Problem: RH PAIN MANAGEMENT Goal: RH STG PAIN MANAGED AT OR BELOW PT'S PAIN GOAL Description Pain less than or equal to 2.  Outcome: Progressing   Problem: RH KNOWLEDGE DEFICIT Goal: RH STG INCREASE KNOWLEDGE OF HYPERTENSION Outcome: Progressing   Problem: Consults Goal: RH STROKE PATIENT EDUCATION Description See Patient Education module for education specifics  Outcome: Progressing   Problem: RH BOWEL ELIMINATION Goal: RH STG MANAGE BOWEL WITH ASSISTANCE Description STG Manage Bowel with mod I.  Outcome: Progressing Goal: RH STG MANAGE BOWEL W/MEDICATION W/ASSISTANCE Description STG Manage Bowel with Medication with mod I.  Outcome: Progressing   Problem: RH BLADDER ELIMINATION Goal: RH STG MANAGE BLADDER WITH ASSISTANCE Description STG Manage Bladder With mod i  Outcome: Progressing Goal: RH STG MANAGE BLADDER WITH MEDICATION WITH ASSISTANCE Description STG Manage Bladder With Medication With mod I.  Outcome: Progressing   Problem: RH SKIN INTEGRITY Goal: RH STG SKIN FREE OF INFECTION/BREAKDOWN Outcome: Progressing Goal: RH STG MAINTAIN SKIN INTEGRITY WITH ASSISTANCE Description STG Maintain Skin Integrity With mod i.  Outcome: Progressing   Problem: RH SAFETY Goal: RH STG ADHERE TO SAFETY PRECAUTIONS W/ASSISTANCE/DEVICE Description STG Adhere to Safety Precautions With min Assistance/Device.  Outcome: Progressing Goal: RH STG DECREASED RISK OF FALL WITH ASSISTANCE Description STG Decreased Risk of  Fall With min Assistance.  Outcome: Progressing   Problem: RH COGNITION-NURSING Goal: RH STG ANTICIPATES NEEDS/CALLS FOR ASSIST W/ASSIST/CUES Description STG Anticipates Needs/Calls for Assist With  min Assistance/Cues.  Outcome: Progressing   Problem: RH PAIN MANAGEMENT Goal: RH STG PAIN MANAGED AT OR BELOW PT'S PAIN GOAL Description Pain less than or equal to 2.  Outcome: Progressing   Problem: RH KNOWLEDGE DEFICIT Goal: RH STG INCREASE KNOWLEDGE OF HYPERTENSION Outcome: Progressing

## 2018-03-01 NOTE — Progress Notes (Signed)
Physical Therapy Session Note  Patient Details  Name: Jocelyn [name] MRN: [id number] Date of Birth: [birth year]  Today's Date: 03/01/2018 PT Individual Time: [id number] PT Individual Time Calculation (min): 73 min   Short Term Goals: Week 2:  PT Short Term Goal 1 (Week 2): Pt will transfer bed<>chair w/ supervision  PT Short Term Goal 2 (Week 2): Pt will demonstrate safety awareness w/ all functional mobility 100% of the time PT Short Term Goal 3 (Week 2): Pt will maintain dynamic standing balance w/ min guard PT Short Term Goal 4 (Week 2): Pt will self-propel w/c in household environment w/ supervision  Skilled Therapeutic Interventions/Progress Updates:    Pt using bathroom upon PT arrival, agreeable to therapy tx and denies pain. Pt performed stand pivot transfer from toilet>w/c with min assist. Therapist discussed benefits of RW over QC at this time including increased weightbearing and use of L UE during functional mobility, pt agreeable however still eager to use QC. Pt transported to gym. Pt performed stand pivot transfer from w/c>mat with min assist, verbal cues for techniques. Pt performed 2 x 10 sit<>stands with 2 inch step under R LE for increased weightbearing through L LE, for NMR and knee control.  Pt transferred into quadruped position on mat with mod assist, pt requiring manual facilitation through L UE for elbow extension. Pt able to perform x 3 hip extension with L LE and x 3 shoudler flexion with R UE in the quadruped position. Pt ambulated x 60 ft with RW and L hand orthosis, pt with LOB this session to the L when distracted by busy environment requiring mod-max assist to correct. Therapist continues to educate pt on importance of using RW at home secondary to poor postural control at times, especially with busy environment. Pt performed forward and lateral step ups on 3 inch step with L LE for knee control and strengthening, UE support on rails, tactile feedback to prevent  hyperextension. Pt ambulated x 80 ft part way back to room with RW and min assist, occasional LOB again during this bout of gait requiring mod assist to correct. Pt left seated in w/c at end of session with needs in reach and QRB in place.   Therapy Documentation Precautions:  Precautions Precautions: Fall Precaution Comments: L hemiplegia  Required Braces or Orthoses: Other Brace/Splint Other Brace/Splint: L AFO Restrictions Weight Bearing Restrictions: No  See Function Navigator for Current Functional Status.   Therapy/Group: Individual Therapy  Cresenciano GenreEmily van Schagen, PT, DPT 03/01/2018, 7:43 AM

## 2018-03-01 NOTE — Progress Notes (Addendum)
[facility]IC[facility] & [facility]ABILITATION     PROGRESS NOTE  Subjective/Complaints:  Patient seen lying in bed this morning.  She states she slept well overnight.  She states that her braces were not placed last night, discussed with nursing.  ROS: Denies CP, SOB, nausea, vomiting diarrhea.  Objective: Vital Signs: Blood pressure 123/85, pulse 77, temperature 98.5 F (36.9 C), temperature source Oral, resp. rate 16, height 5\' 7"  (1.702 m), weight 84.1 kg (185 lb 6.5 oz), SpO2 97 %. No results found. No results for input(s): WBC, HGB, HCT, PLT in the last 72 hours. Recent Labs    03/01/18 0454  NA 139  K 3.4*  CL 105  GLUCOSE 90  BUN 11  CREATININE 0.73  CALCIUM 9.0   CBG (last 3)  No results for input(s): GLUCAP in the last 72 hours.  Wt Readings from Last 3 Encounters:  03/01/18 84.1 kg (185 lb 6.5 oz)  02/13/18 81.2 kg (179 lb 0.2 oz)  07/21/17 80.3 kg (177 lb)    Physical Exam:  BP 123/85 (BP Location: Right Arm)   Pulse 77   Temp 98.5 F (36.9 C) (Oral)   Resp 16   Ht 5\' 7"  (1.702 m)   Wt 84.1 kg (185 lb 6.5 oz)   SpO2 97%   BMI 29.04 kg/m  Constitutional: She appears well-developed and well-nourished. NAD. HENT: Normocephalic and atraumatic.  Eyes: EOM are normal. No discharge.  Cardiovascular: RRR. No JVD. Respiratory: No respiratory distress.  Clear. GI: Bowel sounds are normal. She exhibits no distension.  Musculoskeletal: She exhibits no edema, no tenderness. Neurological: She is alert and oriented Soft voice.  Left facial weakness.  Able to follow simple one step commands without difficulty.  Motor:  LUE: 2-/5 shoulder abduction, elbow flex/ext, 0/5 distally  LLE: HF 3-/5, and KE, 0/5 ADF Emerging tone with clonus at left wrist Skin: Skin is warm and dry.  Psychiatric: She has a normal mood and affect. Her behavior is normal.   Assessment/Plan: 1. 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 Physiatrist is providing close team supervision and 24 hour management of active medical problems listed below. Physiatrist and rehab team continue to assess barriers to discharge/monitor patient progress toward functional and medical goals.  Function:  Bathing Bathing position Bathing activity did not occur: N/A Position: Wheelchair/chair at sink  Bathing parts Body parts bathed by patient: Left arm, Abdomen, Chest, Front perineal area, Right upper leg, Left upper leg, Left lower leg, Right lower leg, Buttocks, Right arm Body parts bathed by helper: Right arm  Bathing assist Assist Level: Supervision or verbal cues, Touching or steadying assistance(Pt > 75%)      Upper Body Dressing/Undressing Upper body dressing   What is the patient wearing?: Bra, Pull over shirt/dress Bra - Perfomed by patient: Thread/unthread left bra strap, Thread/unthread right bra strap Bra - Perfomed by helper: Hook/unhook bra (pull down sports bra) Pull over shirt/dress - Perfomed by patient: Thread/unthread right sleeve, Thread/unthread left sleeve, Pull shirt over trunk, Put head through opening Pull over shirt/dress - Perfomed by helper: Pull shirt over trunk        Upper body assist Assist Level: Supervision or verbal cues      Lower Body Dressing/Undressing Lower body dressing   What is the patient wearing?: Underwear, Pants Underwear - Performed by patient: Thread/unthread left underwear leg, Thread/unthread right underwear leg, Pull underwear  up/down Underwear - Performed by helper: Thread/unthread left underwear leg, Pull underwear up/down Pants- Performed by patient: Thread/unthread right pants leg, Thread/unthread left pants leg, Pull pants up/down Pants- Performed by helper: Thread/unthread right pants leg, Thread/unthread left pants leg, Pull pants up/down Non-skid slipper socks- Performed by patient: Don/doff left  sock, Don/doff right sock Non-skid slipper socks- Performed by helper: Don/doff right sock, Don/doff left sock                  Lower body assist Assist for lower body dressing: Touching or steadying assistance (Pt > 75%)      Toileting Toileting Toileting activity did not occur: No continent bowel/bladder event Toileting steps completed by patient: Adjust clothing prior to toileting, Performs perineal hygiene Toileting steps completed by helper: Adjust clothing after toileting Toileting Assistive Devices: Grab bar or rail  Toileting assist Assist level: Touching or steadying assistance (Pt.75%)   Transfers Chair/bed transfer   Chair/bed transfer method: Stand pivot Chair/bed transfer assist level: Touching or steadying assistance (Pt > 75%) Chair/bed transfer assistive device: Armrests     Locomotion Ambulation     Max distance: 100' Assist level: Touching or steadying assistance (Pt > 75%)   Wheelchair   Type: Manual Max wheelchair distance: 150' Assist Level: Supervision or verbal cues  Cognition Comprehension Comprehension assist level: Follows complex conversation/direction with extra time/assistive device  Expression Expression assist level: Expresses complex ideas: With extra time/assistive device  Social Interaction Social Interaction assist level: Interacts appropriately with others with medication or extra time (anti-anxiety, antidepressant).  Problem Solving Problem solving assist level: Solves complex problems: With extra time  Memory Memory assist level: More than reasonable amount of time     Medical Problem List and Plan: 1.  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   Cont CIR  2.  DVT Prophylaxis/Anticoagulation: Mechanical: Sequential compression devices, below knee Bilateral lower extremities 3. Pain Management: N/A. Left troch bursitis improved with voltaren 4. Mood: LCSW to follow for  evaluation and support.  5. Neuropsych: This patient is not fully capable of making decisions on her own behalf. 6. Skin/Wound Care:  Routine pressure relief measures.  7. Fluids/Electrolytes/Nutrition: Monitor I/Os.  8. HTN: Monitor BP bid. Continue Norvasc. Titrate Prinivil upwards for tighter control.     Vitals:   02/28/18 1413 03/01/18 0314  BP: 109/67 123/85  Pulse: 75 77  Resp: 18 16  Temp: (!) 97.4 F (36.3 C) 98.5 F (36.9 C)  SpO2: 100% 97%   Controlled 4/24 9.  Acute on chronic anemia: Continue iron supplement. Per pt not compliant at home with this   Hemoglobin 9.3 on 4/17  stool guaic,4/18 negative 10.  Constipation -    Adjust bowel reg as necessary 11.  Hypokalemia  Potassium 3.4 on 4/24  Supplemented x1 day  Continue to monitor  LOS (Days) 12 A FACE TO FACE EVALUATION WAS PERFORMED  Nyair Depaulo Karis Jubanil Tauren Delbuono 03/01/2018 9:29 AM

## 2018-03-01 NOTE — Patient Care Conference (Signed)
Inpatient RehabilitationTeam Conference and Plan of Care Update Date: 03/01/2018   Time: 11:30 AM    Patient Name: Jocelyn [name]      Medical Record Number: [id number]  Date of Birth: 1966/04/07 Sex: Female         Room/Bed: 4W17C/4W17C-01 Payor Info: Payor: [employer] park [employer] / Plan: [place] / Product Type: *No Product type* /    Admitting Diagnosis: R BG Hem  Admit Date/Time:  02/17/2018  6:38 PM Admission Comments: No comment available   Primary Diagnosis:  <principal problem not specified> Principal Problem: <principal problem not specified>  Patient Active Problem List   Diagnosis Date Noted  . Slow transit constipation   . Greater trochanteric bursitis of left hip   . Acute blood loss anemia   . 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 Hyperlipemia 02/16/2018  . Cerebral edema ([place]) 02/16/2018  . Benign essential HTN   . Anemia of chronic disease   . Amphetamine abuse ([place])   . Hypokalemia   . ICH (intracerebral hemorrhage) ([place]) - R basal ganglia 02/13/2018  . Pneumonia, community acquired 09/23/2015  . Microcytic hypochromic anemia 09/23/2015  . Tobacco abuse 09/23/2015  . Pneumonia 09/23/2015  . Nicotine addiction 12/09/2013  . Microcytic anemia 12/09/2013  . HTN (hypertension) 12/09/2013  . BMI 36.0-36.9,adult 12/09/2013    Expected Discharge Date: Expected Discharge Date: 03/09/18  Team Members Present: Physician leading conference: Dr. Delice Lesch Social Worker Present: Ovidio Kin, LCSW Nurse Present: Isla Pence, RN PT Present: Michaelene Song, PT OT Present: Willeen Cass, OT SLP Present: Weston Anna, SLP PPS Coordinator present : Daiva Nakayama, RN, CRRN     Current Status/Progress Goal Weekly Team Focus  Medical   Receptive aphasia, left sided weakness and cognitive deficits secondary to right basal ganglia hemorrhage  Improve BP, ABLA, hypokalemia, mobility  See above   Bowel/Bladder   cont b/b; lbm 4/22  maintain  with min assist  assess q shift and prn   Swallow/Nutrition/ Hydration             ADL's   Min A overall- can stand-pivot with supervision   Supervision  L UE NMR, standing balance, modified bathing/dressing, NMES, pt/family education   Mobility   min assist to close supervision overall, mod assist gait w/o RW  supervision overall  LLE NMR, gait and functional transfers, possible ortho consult for LAFO use, discharge planning and family education   Communication             Safety/Cognition/ Behavioral Observations            Pain   scheduled voltaren gel for mild pain to L hip  <2 out of 10  assess q shift and prn   Skin   CDI  maintain  assess q shift and prn      *See Care Plan and progress notes for long and short-term goals.     Barriers to Discharge  Current Status/Progress Possible Resolutions Date Resolved   Physician    Medical stability     See above  Therapies, optimize BP meds, follow labs      Nursing                  PT  Decreased caregiver support  husband works full time, working on Special educational needs teacher              OT  SLP                SW                Discharge Planning/Teaching Needs:  Husband work on plan for 24 hr supervision, he has been here for therapy observation      Team Discussion:  Progressing toward her goals of supervision level. Order for AFO and MD is adjusting BP meds. Good return in right hand and voltaren gel helping her hip pain. Met Speech goals DC/ knee control issue working on this. Some education completed with husband last Friday.  Revisions to Treatment Plan:  DC 5/2    Continued Need for Acute Rehabilitation Level of Care: The patient requires daily medical management by a physician with specialized training in physical medicine and rehabilitation for the following conditions: Daily direction of a multidisciplinary physical rehabilitation program to ensure safe treatment while eliciting the highest outcome that  is of practical value to the patient.: Yes Daily medical management of patient stability for increased activity during participation in an intensive rehabilitation regime.: Yes Daily analysis of laboratory values and/or radiology reports with any subsequent need for medication adjustment of medical intervention for : Neurological problems  Jocelyn [name], Jocelyn [name] 03/01/2018, 3:13 PM

## 2018-03-01 NOTE — Progress Notes (Signed)
Social Work Patient ID: Jocelyn [name], female   DOB: 07/03/1966, 52 y.o.   MRN: [id number]  Met with pt to discuss team conference progress toward her goals and discharge still 5/2. Pt can see her progress which is encouraging to her and she continues to hope she can get mod/i level where she can be alone at discharge.

## 2018-03-02 ENCOUNTER — Inpatient Hospital Stay (HOSPITAL_COMMUNITY): Payer: 59 | Admitting: Occupational Therapy

## 2018-03-02 ENCOUNTER — Inpatient Hospital Stay (HOSPITAL_COMMUNITY): Payer: 59 | Admitting: Physical Therapy

## 2018-03-02 LAB — OCCULT BLOOD X 1 CARD TO LAB, STOOL: Fecal Occult Bld: NEGATIVE

## 2018-03-02 NOTE — Progress Notes (Signed)
Occupational Therapy Session Note  Patient Details  Name: Jocelyn [name] MRN: [id number] Date of Birth: [birth year]  Today's Date: 03/02/2018 OT Individual Time: 0900-1015 OT Individual Time Calculation (min): 75 min   Short Term Goals: Week 2:  OT Short Term Goal 1 (Week 2): STG=LTG 2/2 ELOS  Skilled Therapeutic Interventions/Progress Updates:    Pt greeted sitting in wc and agreeable to OT treatment session. Pt declinde to shower today, and requests to sponge bathe at the sink. Pt able to maneuver wc to the sink and set herself up for bathing tasks. Incorporated L NMR within bathing/dressing tasks with weight bearing at the sink when standing, and facilitating movement within bathing tasks. Pt able to don shirt today using hemi-techniques without verbal cues! Pt brought to therapy gym for neuro re-ed and NMES. 1:1 NMES applied to wrist extensors.   Ratio 1:1 Rate 35 pps Waveform- Asymmetric Ramp 1.0 Pulse 300 Intensity- 15  Duration - 15  Report of pain at the beginning of session  0 Report of pain at the end of session 0  No adverse reactions after treatment and is skin intact. Worked on forearm pronation/supination with pt able to actively pronate but needed assist to supinate. Pt returned to room and left seated in wc with safety blt on and needs met.   Therapy Documentation Precautions:  Precautions Precautions: Fall Precaution Comments: L hemiplegia  Required Braces or Orthoses: Other Brace/Splint Other Brace/Splint: L AFO Restrictions Weight Bearing Restrictions: No Pain:  none/denies pain ADL: ADL ADL Comments: Please see functional navigator  See Function Navigator for Current Functional Status.   Therapy/Group: Individual Therapy  Valma Cava 03/02/2018, 9:39 AM

## 2018-03-02 NOTE — Progress Notes (Signed)
Orthopedic Tech Progress Note Patient Details:  Jocelyn [name] [birth year] [id number]  Patient ID: Jocelyn [name], female   DOB: [birth year], [age] y.o.   MRN: [id number]   Saul FordyceJennifer C [name] 03/02/2018, 9:17 AMCalled [employer] for left Prafo boot and AFO brace.

## 2018-03-02 NOTE — Evaluation (Signed)
Recreational Therapy Assessment and Plan  Patient Details  Name: JASMEN EMRICH MRN: [id number] Date of Birth: 11/12/1965 Today's Date: 03/02/2018  Rehab Potential:  Good ELOS:   discharge 5/2  Assessment Problem List:      Patient Active Problem List   Diagnosis Date Noted  . Acute blood loss anemia   . 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 Hyperlipemia 02/16/2018  . Cerebral edema ([place]) 02/16/2018  . Benign essential HTN   . Anemia of chronic disease   . Amphetamine abuse ([place])   . Hypokalemia   . ICH (intracerebral hemorrhage) ([place]) - R basal ganglia 02/13/2018  . Pneumonia, community acquired 09/23/2015  . Microcytic hypochromic anemia 09/23/2015  . Tobacco abuse 09/23/2015  . Pneumonia 09/23/2015  . Nicotine addiction 12/09/2013  . Microcytic anemia 12/09/2013  . HTN (hypertension) 12/09/2013  . BMI 36.0-36.9,adult 12/09/2013    Past Medical History:      Past Medical History:  Diagnosis Date  . Anemia   . Hypertension   . ICH (intracerebral hemorrhage) ([place]) 02/2018   Past Surgical History:       Past Surgical History:  Procedure Laterality Date  . MYOMECTOMY      Assessment & Plan Clinical Impression: Patient is a 52 y.o. year old female with history of untreated hypertension, tobacco use and anemia who was admitted on 02/14/2015 after found by family with left-sided weakness left facial droop and slurred speech. Last seen normal night before. History taken from chart review and patient. CT of head done showed right basal ganglia hemorrhage with mild edema and leftward shift. Blood pressure 148/101 and she was started on clevidipine as well as hypertonic saline for edema control. CTA head/neck done revealing patent carotid and vertebral arteries, no large vessel occlusion, aneurysm, AVM or significant stenosis and stable bleed. MBS done and patient started on regular diet. Blood pressures continue to be labile and medication have been  titrated for better control. She has been weaned off hypertonic saline and medically stable. Patient with resultant receptive aphasia, left sided weakness and cognitive deficits. Patient transferred to CIR on 02/17/2018 .   Per team, patient presents with decreased coordination, decreased balance, decreased activity tolerance, decreased problem solving, decreased memory and delayed processing.  Met with pt to discuss TR services with emphasis on community reintegration.  Pt agreeable to participate in an outing during LOS.  Plan Min 1 TR session for community reintegration >30 minutes during LOS Recommendations for other services: None   Discharge Criteria: Patient will be discharged from TR if patient refuses treatment 3 consecutive times without medical reason.  If treatment goals not met, if there is a change in medical status, if patient makes no progress towards goals or if patient is discharged from hospital.  The above assessment, treatment plan, treatment alternatives and goals were discussed and mutually agreed upon: by patient  Clarissa 03/02/2018, 8:00 AM

## 2018-03-02 NOTE — Progress Notes (Signed)
Physical Therapy Session Note  Patient Details  Name: Jocelyn [name] MRN: [id number] Date of Birth: 02/27/1966  Today's Date: 03/02/2018 PT Individual Time: 1100-1200 AND 1355-1650 PT Individual Time Calculation (min): 60 min AND 55 min  Short Term Goals: Week 2:  PT Short Term Goal 1 (Week 2): Pt will transfer bed<>chair w/ supervision  PT Short Term Goal 2 (Week 2): Pt will demonstrate safety awareness w/ all functional mobility 100% of the time PT Short Term Goal 3 (Week 2): Pt will maintain dynamic standing balance w/ min guard PT Short Term Goal 4 (Week 2): Pt will self-propel w/c in household environment w/ supervision  Skilled Therapeutic Interventions/Progress Updates:   Session 1:  Pt in w/c and agreeable to therapy, denies pain. Session focused on LLE NMR in standing emphasizing knee and ankle control in WB. Performed multiple sit<>stands w/ hand-over-hand to facilitate boosting w/ L>R L, min assist to boost into standing using this technique fading to close supervision w/ repetition. Performed anterior weight shifting onto LLE while performing reaching tasks in standing w/ verbal cues for to rely on LLE. Placed L foot on 3" step to assist w/ automatically engaging L knee musculature. Performed single leg bends w/ LLE and w/o UE support to work on eccentric control. Min assist needed to assure pt that her LLE would support her in bent position. Additionally performed RLE step ups w/o UE support while maintaining LLE bent knee position, min assist for pt comfort as she was fearful of falling. Performed kinetron in standing to work on activation of LLE extension musculature, 10 x2 reps. Returned to room in w/c, ended session in w/c, call bell within reach and all needs met.   Session 2:  Pt in supine and agreeable to therapy, no c/o pain. Ortho rep present to evaluate use of LAFO. Ambulated 30' w/o use of LAFO w/ quad cane and 30' w/ posterior leaf spring AFO. Decreased L knee  hyperextension in stance and improved L foot clearance w/ LAFO. She continues to ambulate w/ narrow BOS and occasional scissoring. Spent remainder of session working on increasing BOS and more reciprocal movement pattern w/ gait. Ambulated to/from gym w/ RW and min guard. Visual cues on ground for increased BOS for a portion of gait bout. BOS increases w/ visual cues, however unable to maintain for longer than 20-30' 2/2 muscular fatigue. Supine rest break in gym, performed supine bridges w/ emphasis on neutral pelvis positioning. 2x5 bridges w/ both LEs, 1x5 w/ LLE only, and 1x5 w/ orange theraband resistance at knees. Returned to room and ended session in w/c, call bell within reach and all needs met.   Therapy Documentation Precautions:  Precautions Precautions: Fall Precaution Comments: L hemiplegia  Required Braces or Orthoses: Other Brace/Splint Other Brace/Splint: L AFO Restrictions Weight Bearing Restrictions: No  See Function Navigator for Current Functional Status.   Therapy/Group: Individual Therapy  Bethene Hankinson K Arnette 03/02/2018, 12:08 PM

## 2018-03-02 NOTE — Progress Notes (Signed)
[facility] PHYSICAL MEDICINE & REHABILITATION     PROGRESS NOTE  Subjective/Complaints:  Pt seen lying in bed this AM.  She slept well overnight.  Husband at bedside.    ROS: Denies CP, SOB, nausea, vomiting diarrhea.  Objective: Vital Signs: Blood pressure 128/83, pulse 80, temperature 98.4 F (36.9 C), temperature source Oral, resp. rate 18, height 5\' 7"  (1.702 m), weight 84.1 kg (185 lb 6.5 oz), SpO2 99 %. No results found. No results for input(s): WBC, HGB, HCT, PLT in the last 72 hours. Recent Labs    03/01/18 0454  NA 139  K 3.4*  CL 105  GLUCOSE 90  BUN 11  CREATININE 0.73  CALCIUM 9.0   CBG (last 3)  No results for input(s): GLUCAP in the last 72 hours.  Wt Readings from Last 3 Encounters:  03/01/18 84.1 kg (185 lb 6.5 oz)  02/13/18 81.2 kg (179 lb 0.2 oz)  07/21/17 80.3 kg (177 lb)    Physical Exam:  BP 128/83 (BP Location: Right Arm)   Pulse 80   Temp 98.4 F (36.9 C) (Oral)   Resp 18   Ht 5\' 7"  (1.702 m)   Wt 84.1 kg (185 lb 6.5 oz)   SpO2 99%   BMI 29.04 kg/m  Constitutional: She appears well-developed and well-nourished. NAD. HENT: Normocephalic and atraumatic.  Eyes: EOM are normal. No discharge.  Cardiovascular: RRR. No JVD. Respiratory: No respiratory distress.  Clear. GI: Bowel sounds are normal. She exhibits no distension.  Musculoskeletal: She exhibits no edema, no tenderness. Neurological: She is alert and oriented Left facial weakness.  Able to follow simple one step commands without difficulty.  Motor:  LUE: 2-/5 shoulder abduction, elbow flex/ext, 0/5 distally  LLE: HF 2-/5, and KE, 0/5 ADF Emerging tone  Skin: Skin is warm and dry.  Psychiatric: She has a normal mood and affect. Her behavior is normal.   Assessment/Plan: 1. 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 Physiatrist is providing close team  supervision and 24 hour management of active medical problems listed below. Physiatrist and rehab team continue to assess barriers to discharge/monitor patient progress toward functional and medical goals.  Function:  Bathing Bathing position Bathing activity did not occur: N/A Position: Shower  Bathing parts Body parts bathed by patient: Left arm, Abdomen, Chest, Front perineal area, Right upper leg, Left upper leg, Left lower leg, Right lower leg, Buttocks, Right arm Body parts bathed by helper: Right arm  Bathing assist Assist Level: Supervision or verbal cues, Touching or steadying assistance(Pt > 75%)      Upper Body Dressing/Undressing Upper body dressing   What is the patient wearing?: Bra, Pull over shirt/dress Bra - Perfomed by patient: Thread/unthread left bra strap, Thread/unthread right bra strap Bra - Perfomed by helper: Hook/unhook bra (pull down sports bra) Pull over shirt/dress - Perfomed by patient: Thread/unthread right sleeve, Thread/unthread left sleeve, Pull shirt over trunk, Put head through opening Pull over shirt/dress - Perfomed by helper: Pull shirt over trunk        Upper body assist Assist Level: Supervision or verbal cues      Lower Body Dressing/Undressing Lower body dressing   What is the patient wearing?: Underwear, Pants, Non-skid slipper socks Underwear - Performed by patient: Thread/unthread left underwear leg, Thread/unthread right underwear leg, Pull underwear up/down Underwear - Performed by helper: Pull underwear up/down Pants- Performed by patient: Pull pants up/down,  Thread/unthread right pants leg, Thread/unthread left pants leg Pants- Performed by helper: Pull pants up/down Non-skid slipper socks- Performed by patient: Don/doff left sock, Don/doff right sock Non-skid slipper socks- Performed by helper: Don/doff right sock, Don/doff left sock                  Lower body assist Assist for lower body dressing: Touching or steadying  assistance (Pt > 75%)      Toileting Toileting Toileting activity did not occur: No continent bowel/bladder event Toileting steps completed by patient: Adjust clothing prior to toileting, Performs perineal hygiene, Adjust clothing after toileting Toileting steps completed by helper: Adjust clothing after toileting Toileting Assistive Devices: Grab bar or rail  Toileting assist Assist level: Touching or steadying assistance (Pt.75%)   Transfers Chair/bed transfer   Chair/bed transfer method: Stand pivot Chair/bed transfer assist level: Touching or steadying assistance (Pt > 75%) Chair/bed transfer assistive device: Armrests     Locomotion Ambulation     Max distance: 80 ft Assist level: Touching or steadying assistance (Pt > 75%)   Wheelchair   Type: Manual Max wheelchair distance: 150' Assist Level: Supervision or verbal cues  Cognition Comprehension Comprehension assist level: Follows complex conversation/direction with extra time/assistive device  Expression Expression assist level: Expresses basic needs/ideas: With extra time/assistive device  Social Interaction Social Interaction assist level: Interacts appropriately with others with medication or extra time (anti-anxiety, antidepressant).  Problem Solving Problem solving assist level: Solves complex problems: With extra time  Memory Memory assist level: More than reasonable amount of time     Medical Problem List and Plan: 1.  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   Cont CIR  2.  DVT Prophylaxis/Anticoagulation: Mechanical: Sequential compression devices, below knee Bilateral lower extremities 3. Pain Management: N/A. Left troch bursitis improved with voltaren 4. Mood: LCSW to follow for evaluation and support.  5. Neuropsych: This patient is not fully capable of making decisions on her own behalf. 6. Skin/Wound Care:  Routine pressure relief measures.  7.  Fluids/Electrolytes/Nutrition: Monitor I/Os.  8. HTN: Monitor BP bid. Continue Norvasc. Titrate Prinivil upwards for tighter control.     Vitals:   03/01/18 0314 03/02/18 0219  BP: 123/85 128/83  Pulse: 77 80  Resp: 16 18  Temp: 98.5 F (36.9 C) 98.4 F (36.9 C)  SpO2: 97% 99%   Controlled 4/25 9.  Acute on chronic anemia: Continue iron supplement. Per pt not compliant at home with this   Hemoglobin 9.3 on 4/17  Labs ordered for tomorrow  Stool guaic,4/18 negative 10.  Constipation -    Adjust bowel reg as necessary 11.  Hypokalemia  Potassium 3.4 on 4/24  Labs ordered for tomorrow  Supplemented x1 day  Continue to monitor  LOS (Days) 13 A FACE TO FACE EVALUATION WAS PERFORMED  Jmya Uliano Karis Jubanil Pennie Vanblarcom 03/02/2018 9:53 AM

## 2018-03-02 NOTE — Progress Notes (Addendum)
Physical Therapy Weekly Progress Note  Patient Details  Name: Jocelyn [name] MRN: [id number] Date of Birth: 1966/07/03  Beginning of progress report period: February 24, 2018 End of progress report period: March 03, 2018  Today's Date: 03/03/2018 PT Individual Time: 0915-1000 AND 1555-1655 PT Individual Time Calculation (min): 45 min AND 60 min  Patient has met 4 of 4 short term goals. Pt is consistently performing all mobility w/ min assist to close supervision including bed mobility, transfers, and gait. She is ambulating household distances w/ RW using LAFO to assist w/ L foot clearance and to decrease L knee hyperextension in stance. Her husband is supportive and has been present for many therapy sessions. Will continue to work towards supervision to modified independent goals in anticipation of discharge home w/ close to 24/7 supervision.  Patient continues to demonstrate the following deficits muscle weakness, decreased cardiorespiratoy endurance, decreased coordination, decreased motor planning, decreased safety awareness and decreased standing balance, decreased postural control and hemiplegia and therefore will continue to benefit from skilled PT intervention to increase functional independence with mobility.  Patient progressing toward long term goals..  Continue plan of care.  PT Short Term Goals Week 2:  PT Short Term Goal 1 (Week 2): Pt will transfer bed<>chair w/ supervision  PT Short Term Goal 1 - Progress (Week 2): Met PT Short Term Goal 2 (Week 2): Pt will demonstrate safety awareness w/ all functional mobility 100% of the time PT Short Term Goal 2 - Progress (Week 2): Met PT Short Term Goal 3 (Week 2): Pt will maintain dynamic standing balance w/ min guard PT Short Term Goal 3 - Progress (Week 2): Met PT Short Term Goal 4 (Week 2): Pt will self-propel w/c in household environment w/ supervision PT Short Term Goal 4 - Progress (Week 2): Met Week 3:  PT Short Term Goal 1  (Week 3): =LTGs due to ELOS  Skilled Therapeutic Interventions/Progress Updates:   Session 1:  Pt in supine and agreeable to therapy, requesting to bathe and put on new clothes first. Performed bathing at sink per pt's preference, therapist provided set-up assist w/ bathing supplies and min assist overall for bathing tasks. Assist only required to wash RUE and providing min guard when transitioning in and out of standing w/ support at sink. Min assist to don and doff UE garments as well. Verbal and visual cues for dynamic standing balance strategies while performing bathing and dressing tasks. No overt LOB w/ min guard for safety. Ended session in w/c and in care of husband, all needs met.   Session 2:  Pt in supine and agreeable to therapy, denies pain. Total assist w/c transport to gym for time management. Worked on independence w/ pre-mobility tasks including donning shoes w/ LAFO, min assist overall to don shoe w/ LAFO w/ verbal cues for technique. Also has pt don and doff LUE to orthosis on walker w/o assist from therapist. Ambulated in home-like environment requiring pt to negotiate obstacles and make tight turns w/ RW. Min guard for gait in home environment for safety. Ambulated 85' x2 over visual cue for increasing BOS w/ increased BOS overall when visual cue was taken away. Worked on LLE NMR in parallel bars w/ powering up to 3" and 6" steps, 2x5 with each step with and without UE support. Emphasis on using LLE to power up and w/ slow eccentric control back down from step. Min assist when stepping w/o UE support for pt's comfort in attempting to power up w/o  reliance on UE, however pt able to boost w/o assist each time. Pt self-propelled w/c back to room w/ supervision using R hemi technique. Ended session in supine, call bell within reach and all needs met.   Therapy Documentation Precautions:  Precautions Precautions: Fall Precaution Comments: L hemiplegia  Required Braces or Orthoses:  Other Brace/Splint Other Brace/Splint: L AFO Restrictions Weight Bearing Restrictions: No Pain: Pain Assessment Pain Scale: 0-10 Pain Score: 0-No pain  See Function Navigator for Current Functional Status.  Therapy/Group: Individual Therapy  Verda Mehta K Arnette 03/03/2018, 10:58 AM

## 2018-03-03 ENCOUNTER — Inpatient Hospital Stay (HOSPITAL_COMMUNITY): Payer: 59 | Admitting: Physical Therapy

## 2018-03-03 ENCOUNTER — Inpatient Hospital Stay (HOSPITAL_COMMUNITY): Payer: 59 | Admitting: Occupational Therapy

## 2018-03-03 LAB — CBC WITH DIFFERENTIAL/PLATELET
BASOS PCT: 0 %
Basophils Absolute: 0 10*3/uL (ref 0.0–0.1)
EOS PCT: 5 %
Eosinophils Absolute: 0.3 10*3/uL (ref 0.0–0.7)
HCT: 34.4 % — ABNORMAL LOW (ref 36.0–46.0)
Hemoglobin: 10 g/dL — ABNORMAL LOW (ref 12.0–15.0)
LYMPHS ABS: 2.2 10*3/uL (ref 0.7–4.0)
Lymphocytes Relative: 32 %
MCH: 19.4 pg — AB (ref 26.0–34.0)
MCHC: 29.1 g/dL — ABNORMAL LOW (ref 30.0–36.0)
MCV: 66.7 fL — AB (ref 78.0–100.0)
MONO ABS: 0.4 10*3/uL (ref 0.1–1.0)
Monocytes Relative: 6 %
Neutro Abs: 3.9 10*3/uL (ref 1.7–7.7)
Neutrophils Relative %: 57 %
PLATELETS: 424 10*3/uL — AB (ref 150–400)
RBC: 5.16 MIL/uL — ABNORMAL HIGH (ref 3.87–5.11)
RDW: 28.3 % — AB (ref 11.5–15.5)
WBC: 6.8 10*3/uL (ref 4.0–10.5)

## 2018-03-03 LAB — BASIC METABOLIC PANEL
Anion gap: 10 (ref 5–15)
BUN: 10 mg/dL (ref 6–20)
CO2: 24 mmol/L (ref 22–32)
Calcium: 8.9 mg/dL (ref 8.9–10.3)
Chloride: 105 mmol/L (ref 101–111)
Creatinine, Ser: 0.69 mg/dL (ref 0.44–1.00)
GFR calc Af Amer: >60 mL/min (ref >60)
Glucose, Bld: 88 mg/dL (ref 65–99)
POTASSIUM: 3.5 mmol/L (ref 3.5–5.1)
Sodium: 139 mmol/L (ref 135–145)

## 2018-03-03 NOTE — Progress Notes (Signed)
Occupational Therapy Session Note  Patient Details  Name: Jocelyn [name] MRN: [id number] Date of Birth: 05/26/1966  Today's Date: 03/03/2018 OT Individual Time: 1330-1445 OT Individual Time Calculation (min): 75 min   Short Term Goals: Week 2:  OT Short Term Goal 1 (Week 2): STG=LTG 2/2 ELOS  Skilled Therapeutic Interventions/Progress Updates:    Pt greeted semi-reclined in bed and agreeable to OT treatment session. Pt came to sitting EOB and transferred stand-pivot to wc on L side with close supervision. Worked on wc propulsion to therapy gym with 1 rest break. Stand-pivot to therapy mat with supervision. L UE NMR with weight bearing through heel of L hand to flatten soft tan thera-putty. Brought pt into gravity eliminated side-lying position and utilized UE ranger to facilitate elbow flex/ext and shoulder flex/ext. Pt with improved activation and control with repetition. Brought back to sitting and worked on grasp with soft tan thera-putty and soft yellow foam grip. Pt with increased finger flexion and able to flex all 4 fingers today. 1:1 NMES applied to wrist extensors.  Ratio 1:1 Rate 35 pps Waveform- Asymmetric Ramp 1.0 Pulse 300 Intensity- 15 Duration -   15  Report of pain at the beginning of session  Report of pain at the end of session  No adverse reactions after treatment and is skin intact.   Pt returned to room at end of session and left seated In wc with chair alarm on and needs met.   Therapy Documentation Precautions:  Precautions Precautions: Fall Precaution Comments: L hemiplegia  Required Braces or Orthoses: Other Brace/Splint Other Brace/Splint: L AFO Restrictions Weight Bearing Restrictions: No Pain:  none/denies pain ADL: ADL ADL Comments: Please see functional navigator  See Function Navigator for Current Functional Status.   Therapy/Group: Individual Therapy  Elisabeth S Doe 03/03/2018, 2:32 PM  

## 2018-03-03 NOTE — Progress Notes (Signed)
[place] PHYSICAL MEDICINE & REHABILITATION     PROGRESS NOTE  Subjective/Complaints:  Patient seen lying in bed this morning. She states she slept well overnight. She denies complaints.  ROS: denies CP, SOB, nausea, vomiting diarrhea.  Objective: Vital Signs: Blood pressure 124/76, pulse 64, temperature 98.5 F (36.9 C), temperature source Oral, resp. rate 16, height 5\' 7"  (1.702 m), weight 84.1 kg (185 lb 6.5 oz), SpO2 97 %. No results found. Recent Labs    03/03/18 0456  WBC 6.8  HGB 10.0*  HCT 34.4*  PLT 424*   Recent Labs    03/01/18 0454 03/03/18 0456  NA 139 139  K 3.4* 3.5  CL 105 105  GLUCOSE 90 88  BUN 11 10  CREATININE 0.73 0.69  CALCIUM 9.0 8.9   CBG (last 3)  No results for input(s): GLUCAP in the last 72 hours.  Wt Readings from Last 3 Encounters:  03/01/18 84.1 kg (185 lb 6.5 oz)  02/13/18 81.2 kg (179 lb 0.2 oz)  07/21/17 80.3 kg (177 lb)    Physical Exam:  BP 124/76 (BP Location: Right Arm)   Pulse 64   Temp 98.5 F (36.9 C) (Oral)   Resp 16   Ht 5\' 7"  (1.702 m)   Wt 84.1 kg (185 lb 6.5 oz)   SpO2 97%   BMI 29.04 kg/m  Constitutional: She appears well-developed and well-nourished. NAD. HENT: Normocephalic and atraumatic.  Eyes: EOM are normal. No discharge.  Cardiovascular: RRR. No JVD. Respiratory: No respiratory distress.  clear. GI: Bowel sounds are normal. She exhibits no distension.  Musculoskeletal: She exhibits no edema, no tenderness. Neurological: She is alert and oriented Left facial weakness.  Able to follow simple one step commands without difficulty.  Motor:  LUE: 2-/5 shoulder abduction, elbow flex/ext, 0/5 distally (unchanged)  LLE: HF 2-/5, and KE, 0/5 ADF (unchanged) Emerging tone  Skin: Skin is warm and dry.  Psychiatric: She has a normal mood and affect. Her behavior is normal.   Assessment/Plan: 1. 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 Physiatrist is providing close team supervision and 24 hour management of active medical problems listed below. Physiatrist and rehab team continue to assess barriers to discharge/monitor patient progress toward functional and medical goals.  Function:  Bathing Bathing position Bathing activity did not occur: N/A Position: Wheelchair/chair at sink  Bathing parts Body parts bathed by patient: Left arm, Abdomen, Chest, Front perineal area, Right upper leg, Left upper leg, Left lower leg, Right lower leg, Buttocks, Right arm Body parts bathed by helper: Right arm  Bathing assist Assist Level: Supervision or verbal cues, Touching or steadying assistance(Pt > 75%)      Upper Body Dressing/Undressing Upper body dressing   What is the patient wearing?: Bra, Pull over shirt/dress Bra - Perfomed by patient: Thread/unthread left bra strap, Thread/unthread right bra strap Bra - Perfomed by helper: Hook/unhook bra (pull down sports bra) Pull over shirt/dress - Perfomed by patient: Thread/unthread right sleeve, Put head through opening, Thread/unthread left sleeve, Pull shirt over trunk Pull over shirt/dress - Perfomed by helper: Pull shirt over trunk        Upper body assist Assist Level: Touching or steadying assistance(Pt > 75%)      Lower Body Dressing/Undressing Lower body dressing   What is the patient wearing?: Underwear, Pants, Non-skid slipper socks Underwear - Performed by patient: Thread/unthread right underwear leg, Pull  underwear up/down, Thread/unthread left underwear leg Underwear - Performed by helper: Pull underwear up/down Pants- Performed by patient: Thread/unthread left pants leg, Pull pants up/down, Thread/unthread right pants leg Pants- Performed by helper: Pull pants up/down Non-skid slipper socks- Performed by patient: Don/doff left sock, Don/doff right sock Non-skid slipper socks- Performed by helper:  Don/doff right sock, Don/doff left sock                  Lower body assist Assist for lower body dressing: Touching or steadying assistance (Pt > 75%)      Toileting Toileting Toileting activity did not occur: No continent bowel/bladder event Toileting steps completed by patient: Adjust clothing prior to toileting, Performs perineal hygiene, Adjust clothing after toileting Toileting steps completed by helper: Adjust clothing after toileting Toileting Assistive Devices: Grab bar or rail  Toileting assist Assist level: Touching or steadying assistance (Pt.75%)   Transfers Chair/bed transfer   Chair/bed transfer method: Stand pivot, Ambulatory Chair/bed transfer assist level: Maximal assist (Pt 25 - 49%/lift and lower) Chair/bed transfer assistive device: Armrests, Patent attorney     Max distance: 150' Assist level: Touching or steadying assistance (Pt > 75%)   Wheelchair   Type: Manual Max wheelchair distance: 150' Assist Level: Supervision or verbal cues  Cognition Comprehension Comprehension assist level: Follows complex conversation/direction with extra time/assistive device  Expression Expression assist level: Expresses complex ideas: With extra time/assistive device  Social Interaction Social Interaction assist level: Interacts appropriately with others with medication or extra time (anti-anxiety, antidepressant).  Problem Solving Problem solving assist level: Solves complex problems: With extra time  Memory Memory assist level: More than reasonable amount of time     Medical Problem List and Plan: 1.  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   Cont CIR  2.  DVT Prophylaxis/Anticoagulation: Mechanical: Sequential compression devices, below knee Bilateral lower extremities 3. Pain Management: N/A. Left troch bursitis improved with voltaren 4. Mood: LCSW to follow for evaluation and support.   5. Neuropsych: This patient is not fully capable of making decisions on her own behalf. 6. Skin/Wound Care:  Routine pressure relief measures.  7. Fluids/Electrolytes/Nutrition: Monitor I/Os.  8. HTN: Monitor BP bid. Continue Norvasc. Titrate Prinivil upwards for tighter control.     Vitals:   03/02/18 2027 03/03/18 0525  BP: 124/76 124/76  Pulse:  64  Resp:  16  Temp:  98.5 F (36.9 C)  SpO2:  97%   Controlled 4/26 9.  Acute on chronic anemia: Continue iron supplement. Per pt not compliant at home with this   Hemoglobin 10.0 on 4/26  Stool guaic,4/18 negative 10.  Constipation -    Adjust bowel reg as necessary 11.  Hypokalemia  Potassium 3.5 on 4/26  Labs ordered for Monday  Supplemented x1 day  Continue to monitor  LOS (Days) 14 A FACE TO FACE EVALUATION WAS PERFORMED  Jocelyn [name] 03/03/2018 9:31 AM

## 2018-03-04 NOTE — Progress Notes (Signed)
[facility]ICINE & REHABILITATION     PROGRESS NOTE  Subjective/Complaints:  Slept well. No new issues.   ROS: Patient denies fever, rash, sore throat, blurred vision, nausea, vomiting, diarrhea, cough, shortness of breath or chest pain, joint or back pain, headache, or mood change.    Objective: Vital Signs: Blood pressure 125/83, pulse 63, temperature 98.7 F (37.1 C), temperature source Oral, resp. rate 18, height  (1.702 m), weight 84.1 kg (185 lb 6.5 oz), SpO2 100 %. No results found. Recent Labs    03/03/18 0456  WBC 6.8  HGB 10.0*  HCT 34.4*  PLT 424*   Recent Labs    03/03/18 0456  NA 139  K 3.5  CL 105  GLUCOSE 88  BUN 10  CREATININE 0.69  CALCIUM 8.9   CBG (last 3)  No results for input(s): GLUCAP in the last 72 hours.  Wt Readings from Last 3 Encounters:  03/01/18 84.1 kg (185 lb 6.5 oz)  02/13/18 81.2 kg (179 lb 0.2 oz)  07/21/17 80.3 kg (177 lb)    Physical Exam:  BP 125/83 (BP Location: Right Arm)   Pulse 63   Temp 98.7 F (37.1 C) (Oral)   Resp 18   Ht  (1.702 m)   Wt 84.1 kg (185 lb 6.5 oz)   SpO2 100%   BMI 29.04 kg/m  Constitutional: No distress . Vital signs reviewed. HEENT: EOMI, oral membranes moist Neck: supple Cardiovascular: RRR without murmur. No JVD    Respiratory: CTA Bilaterally without wheezes or rales. Normal effort    GI: BS +, non-tender, non-distended   Musculoskeletal: She exhibits no edema, no tenderness. Neurological: She is alert and oriented Left facial weakness.  Able to follow simple one step commands without difficulty.  Motor:  LUE: 2-/5 shoulder abduction, elbow flex/ext, 0/5 distally (stable exam)  LLE: HF 2-/5, and KE, 0/5 ADF (stable exam) Emerging tone left lower extremity Skin: Skin is warm and dry.  Psychiatric: She has a normal mood and affect. Her behavior is normal.   Assessment/Plan: 1. 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 Physiatrist is providing close team supervision and 24 hour management of active medical problems listed below. Physiatrist and rehab team continue to assess barriers to discharge/monitor patient progress toward functional and medical goals.  Function:  Bathing Bathing position Bathing activity did not occur: N/A Position: Wheelchair/chair at sink  Bathing parts Body parts bathed by patient: Left arm, Abdomen, Chest, Front perineal area, Right upper leg, Left upper leg, Left lower leg, Right lower leg, Buttocks, Right arm Body parts bathed by helper: Right arm  Bathing assist Assist Level: Supervision or verbal cues, Touching or steadying assistance(Pt > 75%)      Upper Body Dressing/Undressing Upper body dressing   What is the patient wearing?: Bra, Pull over shirt/dress Bra - Perfomed by patient: Thread/unthread left bra strap, Thread/unthread right bra strap Bra - Perfomed by helper: Hook/unhook bra (pull down sports bra) Pull over shirt/dress - Perfomed by patient: Thread/unthread right sleeve, Put head through opening, Thread/unthread left sleeve, Pull shirt over trunk Pull over shirt/dress - Perfomed by helper: Pull shirt over trunk        Upper body assist Assist Level: Touching or steadying assistance(Pt > 75%)      Lower Body Dressing/Undressing Lower body dressing   What is the patient wearing?: Underwear, Pants, Non-skid slipper socks  Underwear - Performed by patient: Thread/unthread right underwear leg, Pull underwear up/down, Thread/unthread left underwear leg Underwear - Performed by helper: Pull underwear up/down Pants- Performed by patient: Thread/unthread left pants leg, Pull pants up/down, Thread/unthread right pants leg Pants- Performed by helper: Pull pants up/down Non-skid slipper socks- Performed by patient: Don/doff left sock, Don/doff right sock Non-skid slipper socks- Performed  by helper: Don/doff right sock, Don/doff left sock                  Lower body assist Assist for lower body dressing: Touching or steadying assistance (Pt > 75%)      Toileting Toileting Toileting activity did not occur: No continent bowel/bladder event Toileting steps completed by patient: Adjust clothing prior to toileting, Adjust clothing after toileting, Performs perineal hygiene Toileting steps completed by helper: Adjust clothing after toileting, Adjust clothing prior to toileting Toileting Assistive Devices: Grab bar or rail  Toileting assist Assist level: Touching or steadying assistance (Pt.75%)   Transfers Chair/bed transfer   Chair/bed transfer method: Stand pivot, Ambulatory Chair/bed transfer assist level: Touching or steadying assistance (Pt > 75%) Chair/bed transfer assistive device: Armrests, Patent attorney     Max distance: 33' Assist level: Touching or steadying assistance (Pt > 75%)   Wheelchair   Type: Manual Max wheelchair distance: 150' Assist Level: Supervision or verbal cues  Cognition Comprehension Comprehension assist level: Follows complex conversation/direction with extra time/assistive device  Expression Expression assist level: Expresses complex ideas: With extra time/assistive device  Social Interaction Social Interaction assist level: Interacts appropriately with others with medication or extra time (anti-anxiety, antidepressant).  Problem Solving Problem solving assist level: Solves complex problems: With extra time  Memory Memory assist level: More than reasonable amount of time     Medical Problem List and Plan: 1.  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   Cont CIR PT, OT 2.  DVT Prophylaxis/Anticoagulation: Mechanical: Sequential compression devices, below knee Bilateral lower extremities 3. Pain Management: N/A. Left troch bursitis improved with  voltaren 4. Mood: LCSW to follow for evaluation and support.  5. Neuropsych: This patient is not fully capable of making decisions on her own behalf. 6. Skin/Wound Care:  Routine pressure relief measures.  7. Fluids/Electrolytes/Nutrition: Monitor I/Os.  8. HTN: Monitor BP bid. Continue Norvasc. Titrate Prinivil upwards for tighter control.     Vitals:   03/03/18 2045 03/04/18 0209  BP: 117/72 125/83  Pulse: 70 63  Resp:  18  Temp:  98.7 F (37.1 C)  SpO2:  100%   Controlled 4/27 9.  Acute on chronic anemia: Continue iron supplement. Per pt not compliant at home with this   Hemoglobin 10.0 on 4/26  Stool guaic,4/18 negative 10.  Constipation -    Adjust bowel reg as necessary 11.  Hypokalemia  Potassium 3.5 on 4/26  Labs ordered for Monday  Supplemented x1 day  Continue to monitor  LOS (Days) 15 A FACE TO FACE EVALUATION WAS PERFORMED  Ranelle Oyster 03/04/2018 9:28 AM

## 2018-03-05 ENCOUNTER — Inpatient Hospital Stay (HOSPITAL_COMMUNITY): Payer: 59

## 2018-03-05 NOTE — Progress Notes (Signed)
[facility] MEDICINE & REHABILITATION     PROGRESS NOTE  Subjective/Complaints:  Patient lying in bed with no new complaints.  ROS: Patient denies fever, rash, sore throat, blurred vision, nausea, vomiting, diarrhea, cough, shortness of breath or chest pain, joint or back pain, headache, or mood change.    Objective: Vital Signs: Blood pressure 129/75, pulse 61, temperature 98.3 F (36.8 C), temperature source Oral, resp. rate 16, height  (1.702 m), weight 84.1 kg (185 lb 6.5 oz), SpO2 100 %. No results found. Recent Labs    03/03/18 0456  WBC 6.8  HGB 10.0*  HCT 34.4*  PLT 424*   Recent Labs    03/03/18 0456  NA 139  K 3.5  CL 105  GLUCOSE 88  BUN 10  CREATININE 0.69  CALCIUM 8.9   CBG (last 3)  No results for input(s): GLUCAP in the last 72 hours.  Wt Readings from Last 3 Encounters:  03/01/18 84.1 kg (185 lb 6.5 oz)  02/13/18 81.2 kg (179 lb 0.2 oz)  07/21/17 80.3 kg (177 lb)    Physical Exam:  BP 129/75 (BP Location: Right Arm)   Pulse 61   Temp 98.3 F (36.8 C) (Oral)   Resp 16   Ht  (1.702 m)   Wt 84.1 kg (185 lb 6.5 oz)   SpO2 100%   BMI 29.04 kg/m  Constitutional: No distress . Vital signs reviewed. HEENT: EOMI, oral membranes moist Neck: supple Cardiovascular: RRR without murmur. No JVD    Respiratory: CTA Bilaterally without wheezes or rales. Normal effort    GI: BS +, non-tender, non-distended   Musculoskeletal: She exhibits no edema, no tenderness. Neurological: She is alert and oriented Left facial weakness.  Able to follow simple one step commands without difficulty.  Motor:  LUE: 2-/5 shoulder abduction, elbow flex/ext, 0/5 distally (stable exam)  LLE: HF 2-/5, and KE, 0/5 ADF (stable exam) Minimal tone in left arm or leg today. Skin: Skin is warm and dry.  Psychiatric: Pleasant and cooperative   Assessment/Plan: 1. 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 Physiatrist is providing close team supervision and 24 hour management of active medical problems listed below. Physiatrist and rehab team continue to assess barriers to discharge/monitor patient progress toward functional and medical goals.  Function:  Bathing Bathing position Bathing activity did not occur: N/A Position: Wheelchair/chair at sink  Bathing parts Body parts bathed by patient: Left arm, Abdomen, Chest, Front perineal area, Right upper leg, Left upper leg, Left lower leg, Right lower leg, Buttocks, Right arm Body parts bathed by helper: Right arm  Bathing assist Assist Level: Supervision or verbal cues, Touching or steadying assistance(Pt > 75%)      Upper Body Dressing/Undressing Upper body dressing   What is the patient wearing?: Bra, Pull over shirt/dress Bra - Perfomed by patient: Thread/unthread left bra strap, Thread/unthread right bra strap Bra - Perfomed by helper: Hook/unhook bra (pull down sports bra) Pull over shirt/dress - Perfomed by patient: Thread/unthread right sleeve, Put head through opening, Thread/unthread left sleeve, Pull shirt over trunk Pull over shirt/dress - Perfomed by helper: Pull shirt over trunk        Upper body assist Assist Level: Touching or steadying assistance(Pt > 75%)      Lower Body Dressing/Undressing Lower body dressing   What is the patient wearing?: Underwear, Pants, Non-skid slipper socks Underwear - Performed  by patient: Thread/unthread right underwear leg, Pull underwear up/down, Thread/unthread left underwear leg Underwear - Performed by helper: Pull underwear up/down Pants- Performed by patient: Thread/unthread left pants leg, Pull pants up/down, Thread/unthread right pants leg Pants- Performed by helper: Pull pants up/down Non-skid slipper socks- Performed by patient: Don/doff left sock, Don/doff right sock Non-skid slipper socks- Performed by helper:  Don/doff right sock, Don/doff left sock                  Lower body assist Assist for lower body dressing: Touching or steadying assistance (Pt > 75%)      Toileting Toileting Toileting activity did not occur: No continent bowel/bladder event Toileting steps completed by patient: Adjust clothing prior to toileting, Adjust clothing after toileting, Performs perineal hygiene Toileting steps completed by helper: Adjust clothing after toileting, Adjust clothing prior to toileting Toileting Assistive Devices: Grab bar or rail  Toileting assist Assist level: Touching or steadying assistance (Pt.75%)   Transfers Chair/bed transfer   Chair/bed transfer method: Stand pivot, Ambulatory Chair/bed transfer assist level: Touching or steadying assistance (Pt > 75%) Chair/bed transfer assistive device: Armrests, Patent attorney     Max distance: 68' Assist level: Touching or steadying assistance (Pt > 75%)   Wheelchair   Type: Manual Max wheelchair distance: 150' Assist Level: Supervision or verbal cues  Cognition Comprehension Comprehension assist level: Follows complex conversation/direction with extra time/assistive device  Expression Expression assist level: Expresses complex ideas: With extra time/assistive device  Social Interaction Social Interaction assist level: Interacts appropriately with others with medication or extra time (anti-anxiety, antidepressant).  Problem Solving Problem solving assist level: Solves complex problems: With extra time  Memory Memory assist level: More than reasonable amount of time     Medical Problem List and Plan: 1.  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   Cont CIR PT, OT 2.  DVT Prophylaxis/Anticoagulation: Mechanical: Sequential compression devices, below knee Bilateral lower extremities 3. Pain Management: N/A. Left troch bursitis improved with voltaren 4. Mood:  LCSW to follow for evaluation and support.  5. Neuropsych: This patient is not fully capable of making decisions on her own behalf. 6. Skin/Wound Care:  Routine pressure relief measures.  7. Fluids/Electrolytes/Nutrition: Monitor I/Os.  8. HTN: Monitor BP bid. Continue Norvasc. Titrate Prinivil upwards for tighter control.     Vitals:   03/04/18 0209 03/05/18 0537  BP: 125/83 129/75  Pulse: 63 61  Resp: 18 16  Temp: 98.7 F (37.1 C) 98.3 F (36.8 C)  SpO2: 100% 100%   Controlled 4/28 9.  Acute on chronic anemia: Continue iron supplement. Per pt not compliant at home with this   Hemoglobin 10.0 on 4/26  Stool guaic,4/18 negative 10.  Constipation -    Adjust bowel reg as necessary 11.  Hypokalemia  Potassium 3.5 on 4/26  Labs ordered for Monday  Supplemented x1 day  Continue to monitor  LOS (Days) 16 A FACE TO FACE EVALUATION WAS PERFORMED  Ranelle Oyster 03/05/2018 10:12 AM

## 2018-03-05 NOTE — Progress Notes (Signed)
Physical Therapy Session Note  Patient Details  Name: Jocelyn [name] MRN: [id number] Date of Birth: 1966/01/08  Today's Date: 03/05/2018 PT Individual Time: 0901-0930 PT Individual Time Calculation (min): 29 min   Short Term Goals: Week 3:  PT Short Term Goal 1 (Week 3): =LTGs due to ELOS  Skilled Therapeutic Interventions/Progress Updates:    Pt seated in w/c upon PT arrival, agreeable to therapy tx and denies pain. Pt donned socks and pants while seated in w/c with supervision, supervision for sit<>stand and min assist for standing balance while pulling pants over hips. Pt transported to dayroom in w/c. Pt performed stand pivot transfer from w/c<>kinetron with min assist and verbal cues for techniques/symmetric LE weightbearing. Pt used kinetron to perform 2 x 10 sit<>stand with emphasis on symmetric LE weightbearing, worked on standing and postural control to perform alternating stepping in place on kinetron. Pt with LOB to the L requiring cues to correct and physical assist to correct. Pt transferred back to w/c and left seated in w/c in room in care of husband.   Therapy Documentation Precautions:  Precautions Precautions: Fall Precaution Comments: L hemiplegia  Required Braces or Orthoses: Other Brace/Splint Other Brace/Splint: L AFO Restrictions Weight Bearing Restrictions: No   See Function Navigator for Current Functional Status.   Therapy/Group: Individual Therapy  Cresenciano Genre, PT, DPT 03/05/2018, 7:47 AM

## 2018-03-06 ENCOUNTER — Inpatient Hospital Stay (HOSPITAL_COMMUNITY): Payer: 59 | Admitting: Occupational Therapy

## 2018-03-06 ENCOUNTER — Inpatient Hospital Stay (HOSPITAL_COMMUNITY): Payer: 59

## 2018-03-06 DIAGNOSIS — I69252 Hemiplegia and hemiparesis following other nontraumatic intracranial hemorrhage affecting left dominant side: Secondary | ICD-10-CM

## 2018-03-06 LAB — BASIC METABOLIC PANEL
Anion gap: 9 (ref 5–15)
BUN: 11 mg/dL (ref 6–20)
CHLORIDE: 103 mmol/L (ref 101–111)
CO2: 28 mmol/L (ref 22–32)
CREATININE: 0.88 mg/dL (ref 0.44–1.00)
Calcium: 9.4 mg/dL (ref 8.9–10.3)
GFR calc non Af Amer: >60 mL/min (ref >60)
GLUCOSE: 101 mg/dL — AB (ref 65–99)
Potassium: 4 mmol/L (ref 3.5–5.1)
Sodium: 140 mmol/L (ref 135–145)

## 2018-03-06 NOTE — Progress Notes (Signed)
[facility] PHYSICAL MEDICINE & REHABILITATION     PROGRESS NOTE  Subjective/Complaints:  Still with Left hip pain, no other new issues  ROS: Patient denies fever, rash, sore throat, blurred vision, nausea, vomiting, diarrhea, cough, shortness of breath or chest pain, joint or back pain, headache, or mood change.    Objective: Vital Signs: Blood pressure 113/74, pulse 68, temperature 98.7 F (37.1 C), temperature source Oral, resp. rate 19, height  (1.702 m), weight 84.1 kg (185 lb 6.5 oz), SpO2 100 %. No results found. No results for input(s): WBC, HGB, HCT, PLT in the last 72 hours. No results for input(s): NA, K, CL, GLUCOSE, BUN, CREATININE, CALCIUM in the last 72 hours.  Invalid input(s): CO CBG (last 3)  No results for input(s): GLUCAP in the last 72 hours.  Wt Readings from Last 3 Encounters:  03/01/18 84.1 kg (185 lb 6.5 oz)  02/13/18 81.2 kg (179 lb 0.2 oz)  07/21/17 80.3 kg (177 lb)    Physical Exam:  BP 113/74 (BP Location: Right Arm)   Pulse 68   Temp 98.7 F (37.1 C) (Oral)   Resp 19   Ht  (1.702 m)   Wt 84.1 kg (185 lb 6.5 oz)   SpO2 100%   BMI 29.04 kg/m  Constitutional: No distress . Vital signs reviewed. HEENT: EOMI, oral membranes moist Neck: supple Cardiovascular: RRR without murmur. No JVD    Respiratory: CTA Bilaterally without wheezes or rales. Normal effort    GI: BS +, non-tender, non-distended   Musculoskeletal: She exhibits no edema, no tenderness. Neurological: She is alert and oriented Left facial weakness.  Able to follow simple one step commands without difficulty.  Motor:  LUE: 2-/5 shoulder abduction, elbow flex/ext, 1/5 finger flex (stable exam)  LLE: HF 2-/5, and KE, 0/5 ADF , 2- toe ext Minimal tone in left arm or leg today. Skin: Skin is warm and dry.  Psychiatric: Pleasant and cooperative   Assessment/Plan: 1. 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 Physiatrist is providing close team supervision and 24 hour management of active medical problems listed below. Physiatrist and rehab team continue to assess barriers to discharge/monitor patient progress toward functional and medical goals.  Function:  Bathing Bathing position Bathing activity did not occur: N/A Position: Wheelchair/chair at sink  Bathing parts Body parts bathed by patient: Left arm, Abdomen, Chest, Front perineal area, Right upper leg, Left upper leg, Left lower leg, Right lower leg, Buttocks, Right arm Body parts bathed by helper: Right arm  Bathing assist Assist Level: Supervision or verbal cues, Touching or steadying assistance(Pt > 75%)      Upper Body Dressing/Undressing Upper body dressing   What is the patient wearing?: Bra, Pull over shirt/dress Bra - Perfomed by patient: Thread/unthread left bra strap, Thread/unthread right bra strap Bra - Perfomed by helper: Hook/unhook bra (pull down sports bra) Pull over shirt/dress - Perfomed by patient: Thread/unthread right sleeve, Put head through opening, Thread/unthread left sleeve, Pull shirt over trunk Pull over shirt/dress - Perfomed by helper: Pull shirt over trunk        Upper body assist Assist Level: Touching or steadying assistance(Pt > 75%)      Lower Body Dressing/Undressing Lower body dressing   What is the patient wearing?: Underwear, Pants, Non-skid slipper socks Underwear - Performed by patient: Thread/unthread right underwear leg, Pull underwear up/down, Thread/unthread left underwear leg Underwear -  Performed by helper: Pull underwear up/down Pants- Performed by patient: Thread/unthread left pants leg, Pull pants up/down, Thread/unthread right pants leg Pants- Performed by helper: Pull pants up/down Non-skid slipper socks- Performed by patient: Don/doff left sock, Don/doff right sock Non-skid slipper socks- Performed by helper:  Don/doff right sock, Don/doff left sock                  Lower body assist Assist for lower body dressing: Touching or steadying assistance (Pt > 75%)      Toileting Toileting Toileting activity did not occur: No continent bowel/bladder event Toileting steps completed by patient: Adjust clothing prior to toileting, Adjust clothing after toileting, Performs perineal hygiene Toileting steps completed by helper: Adjust clothing after toileting, Adjust clothing prior to toileting Toileting Assistive Devices: Grab bar or rail  Toileting assist Assist level: Touching or steadying assistance (Pt.75%)   Transfers Chair/bed transfer   Chair/bed transfer method: Stand pivot Chair/bed transfer assist level: Touching or steadying assistance (Pt > 75%) Chair/bed transfer assistive device: Armrests     Locomotion Ambulation     Max distance: 45' Assist level: Touching or steadying assistance (Pt > 75%)   Wheelchair   Type: Manual Max wheelchair distance: 150' Assist Level: Supervision or verbal cues  Cognition Comprehension Comprehension assist level: Follows complex conversation/direction with extra time/assistive device  Expression Expression assist level: Expresses complex ideas: With extra time/assistive device  Social Interaction Social Interaction assist level: Interacts appropriately with others with medication or extra time (anti-anxiety, antidepressant).  Problem Solving Problem solving assist level: Solves complex problems: With extra time  Memory Memory assist level: More than reasonable amount of time     Medical Problem List and Plan: 1.  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   Cont CIR PT, OT- discussed E stim with pt and husband , has tried in therapy- good candidate for this 2.  DVT Prophylaxis/Anticoagulation: Mechanical: Sequential compression devices, below knee Bilateral lower extremities 3. Pain  Management: N/A. Left troch bursitis improved with voltaren 4. Mood: LCSW to follow for evaluation and support.  5. Neuropsych: This patient is not fully capable of making decisions on her own behalf. 6. Skin/Wound Care:  Routine pressure relief measures.  7. Fluids/Electrolytes/Nutrition: Monitor I/Os.  8. HTN: Monitor BP bid. Continue Norvasc. Titrate Prinivil upwards for tighter control.     Vitals:   03/05/18 2037 03/06/18 0404  BP: 126/72 113/74  Pulse:  68  Resp:  19  Temp:    SpO2:  100%   Controlled 4/29 9.  Acute on chronic anemia: Continue iron supplement. Per pt not compliant at home with this   Hemoglobin 10.0 on 4/26  Stool guaic,4/18 negative 10.  Constipation -    Adjust bowel reg as necessary 11.  Hypokalemia  Potassium 3.5 on 4/26  bmet pnd 4/29  Supplemented x1 day  Continue to monitor  LOS (Days) 17 A FACE TO FACE EVALUATION WAS PERFORMED  Erick Colace 03/06/2018 7:42 AM

## 2018-03-06 NOTE — Progress Notes (Signed)
Physical Therapy Session Note  Patient Details  Name: Jocelyn [name] MRN: [id number] Date of Birth: 11/08/1966  Today's Date: 03/06/2018 PT Individual Time: 1103-1200 AND 1415-1527 PT Individual Time Calculation (min): 57 min and 72 min   Short Term Goals: Week 3:  PT Short Term Goal 1 (Week 3): =LTGs due to ELOS  Skilled Therapeutic Interventions/Progress Updates:    Session 1: Pt using the bathroom upon PT arrival, agreeable to therapy tx and denies pain. Pt performed stand pivot transfer from toilet>w/c with min assist. Pt transported to the gym. Pt performed stand pivot transfer from w/c>large blue physioball with min assist. Pt seated on physioball worked on core strengthening and postural control to perform pelvic tilts anterior/posterior, lateral weightshifts, alternating hip flexion and lateral trunk flexion, manual facilitation for pelvic movements and min-mod assist to correct LOB left. Pt transferred to quadruped on mat with min assist, once in the position, pt requiring stabilization at L wrist and elbow for increased weightbearing through UE. In quadruped pt performed x 10 R UE flexion for increased L UE weightbearing. Pt unable to perform full hip extension in this position, reports 2/10 pain in L wrist. Pt transitioned to modified quadruped position on elbows using bench for UE support, pt performed x 10 hip extension each side for NMR. Pt ambulated back to room with RW and min assist x120 ft, verbal cues for increased step length, pt easily distracted by staff in hallway with one LOB on her way back to the room. Educated pt on minimizing distractions and focusing on gait, recommending w/c for community access upon d/c. Pt left supine in bed with needs in reach and QRB in place.   Session 1: Pt seated in w/c upon PT arrival, agreeable to therapy tx and denies pain. Pt donned R shoe with supervision and L shoe/AFO with assist. Pt transported to the gym. Pt worked on stand pivot  transfers blocked practice from w/c<>mat x 5, emphasis on techniques and balance. Pt worked on dynamic standing balance and L LE stance control in order to perform toe taps on 4 inch step x 2 trials, min assist. Pt ambulated from gym<>rehab apartment with RW and supervision x 80 ft, verbal cues for step length and BOS. Pt worked on ambulating in household environment and performing sit<>stands from lower surfaces (furniture), min assist to stand from couch. Pt ambulated from rehab apartment back to gym x 80 ft with RW while performing dual task to count backwards by 5's from 100, cues also to look up/scan environment, pt with x 2 LOB during this bout of gait requiring mod assist to correct. Pt seated edge of mat worked on L UE NMR exercises including sitting<>sidelying on elbow x 10 with emphasis on elbow extension/triceps activation to push up into sitting. Pt performed active elbow flexion and extension with stabilization from therapist to isolate movements. Pt performed stand pivot back to w/c with supervision and transported back to room. Pt transferred w/c>bed stand pivot with supervision and verbal cues for techniques. Pt left supine in bed with bed alarm set.   Therapy Documentation Precautions:  Precautions Precautions: Fall Precaution Comments: L hemiplegia  Required Braces or Orthoses: Other Brace/Splint Other Brace/Splint: L AFO Restrictions Weight Bearing Restrictions: No   See Function Navigator for Current Functional Status.   Therapy/Group: Individual Therapy  Cresenciano Genre, PT, DPT 03/06/2018, 7:55 AM

## 2018-03-06 NOTE — Progress Notes (Signed)
Occupational Therapy Session Note  Patient Details  Name: Jocelyn [name] MRN: [id number] Date of Birth: December 31, 1965  Today's Date: 03/06/2018 OT Individual Time: 0900-1000 OT Individual Time Calculation (min): 60 min    Short Term Goals: Week 3:   STG=LTG 2/2 ELOS  Skilled Therapeutic Interventions/Progress Updates:    OT treatment session focused on modified bathing/dressing, L UE NMR, and pt/family education. Sup<>sit with supervision and min A sit<>stand with verbal cues for foot placement prior to stand. Pt then ambulated into bathroom with RW and and min A overall with 1 lateral LOB to the L requiring max A to correct. Pt stated she over estimated the ledge going into the bathroom. L UE NMR within bathing tasks using shower mit and weight bearing to wash body parts. Educated pt's spouse on how to assist with balance when pt stands in shower to wash peri-area and buttocks. Spouse demonstrated understanding and provided min A to pt in shower. Provided pt's spouse with home measurement sheet for him to fill out today. Dressing completed sit<.stand with min A overall and no cues for technique. Worked on techniques for donning L AFO today with pt needing assist 2/2 L shoe being too tight. L UE NMR focused on grasp/release and forearm pronation/supination. Pt left seated in wc at the sink with spouse present.   Therapy Documentation Precautions:  Precautions Precautions: Fall Precaution Comments: L hemiplegia  Required Braces or Orthoses: Other Brace/Splint Other Brace/Splint: L AFO Restrictions Weight Bearing Restrictions: No Pain:  none/denies pain ADL: ADL ADL Comments: Please see functional navigator  See Function Navigator for Current Functional Status.   Therapy/Group: Individual Therapy  Mal Amabile 03/06/2018, 10:05 AM

## 2018-03-07 ENCOUNTER — Inpatient Hospital Stay (HOSPITAL_COMMUNITY): Payer: 59 | Admitting: Physical Therapy

## 2018-03-07 ENCOUNTER — Inpatient Hospital Stay (HOSPITAL_COMMUNITY): Payer: 59

## 2018-03-07 ENCOUNTER — Inpatient Hospital Stay (HOSPITAL_COMMUNITY): Payer: 59 | Admitting: Occupational Therapy

## 2018-03-07 NOTE — Progress Notes (Signed)
Physical Therapy Session Note  Patient Details  Name: Jocelyn [name] MRN: [id number] Date of Birth: [birth year]  Today's Date: 03/07/2018 PT Individual Time: 1100-1150 PT Individual Time Calculation (min): 50 min   Short Term Goals: Week 3:  PT Short Term Goal 1 (Week 3): =LTGs due to ELOS  Skilled Therapeutic Interventions/Progress Updates:   Pt received on edge of mat after OT. Denies pain. Session focused on LLE NMR w/ gait and functional activity. Ambulated 150' w/ RW and close supervision w/ emphasis on increasing BOS and activation of L abductor musculature in swing limb advancement phase. Performed Berg Balance Scale as detailed below and explained significance of results to pt. Performed kinetron in standing to work on activation and strengthening of L posterior chain musculature. Min assist overall to assist w/ balance and tactile/manual cues for LLE management. 10 reps x3 bouts overall w/ brief seated rest in between. Returned to room and ended session in w/c, call bell within reach and all needs met. Chair alarm activated.   Therapy Documentation Precautions:  Precautions Precautions: Fall Precaution Comments: L hemiplegia  Required Braces or Orthoses: Other Brace/Splint Other Brace/Splint: L AFO Restrictions Weight Bearing Restrictions: No Pain: Pain Assessment Pain Score: 0-No pain Balance: Standardized Balance Assessment Standardized Balance Assessment: Berg Balance Test Berg Balance Test Sit to Stand: Able to stand without using hands and stabilize independently Standing Unsupported: Able to stand safely 2 minutes Sitting with Back Unsupported but Feet Supported on Floor or Stool: Able to sit safely and securely 2 minutes Stand to Sit: Controls descent by using hands Transfers: Able to transfer safely, minor use of hands Standing Unsupported with Eyes Closed: Able to stand 10 seconds with supervision Standing Ubsupported with Feet Together: Able to place feet  together independently and stand for 1 minute with supervision From Standing, Reach Forward with Outstretched Arm: Can reach forward >12 cm safely (5") From Standing Position, Pick up Object from Floor: Able to pick up shoe, needs supervision From Standing Position, Turn to Look Behind Over each Shoulder: Turn sideways only but maintains balance Turn 360 Degrees: Able to turn 360 degrees safely but slowly Standing Unsupported, Alternately Place Feet on Step/Stool: Needs assistance to keep from falling or unable to try Standing Unsupported, One Foot in Front: Able to plae foot ahead of the other independently and hold 30 seconds Standing on One Leg: Unable to try or needs assist to prevent fall Total Score: 38  See Function Navigator for Current Functional Status.   Therapy/Group: Individual Therapy  Aslynn Brunetti K Arnette 03/07/2018, 11:51 AM

## 2018-03-07 NOTE — Progress Notes (Signed)
Occupational Therapy Discharge Summary  Patient Details  Name: Jocelyn [name] MRN: [id number] Date of Birth: September 09, 1966  Today's Date: 03/08/2018 OT Individual Time: 2536-6440 OT Individual Time Calculation (min): 60 min    Patient has met 11 of 11 long term goals due to improved activity tolerance, improved balance, postural control, ability to compensate for deficits, functional use of  LEFT upper and LEFT lower extremity, improved attention, improved awareness and improved coordination.  Patient to discharge at overall Supervision level.  Patient's care partner is independent to provide the necessary physical assistance at discharge.  Pt remains limited by her flaccid L UE but is implementing hemi-dressing and bathing techniques with (S).   Reasons goals not met: N/A  Recommendation:  Patient will benefit from ongoing skilled OT services in outpatient setting to continue to advance functional skills in the area of BADL and functional use of L UE.  Equipment: tub transfer bench, 3-in-1 BSC, wc(see PT note) RW (see PT note)  Reasons for discharge: treatment goals met and discharge from hospital  Patient/family agrees with progress made and goals achieved: Yes   OT Treatment Note:  Pt was received supine in bed with husband present, agreeable to therapy. Pt transitioned to standing from supine with set up only. Pt ambulated to bathroom with RW, requiring vc for safe RW management and (S) level for toilet transfer. Pt completed bathing tasks at sink level, requiring (S) for standing level peri-cleansing and clothing management. Set up required to don bra at seated level, with pt unable to problem solve through straps becoming twisted, requiring manual facilitation. Demo provided re unilateral UE deodorant application with teachback performed. Pt's husband present throughout session, intermittently asking questions re d/c planning throughout. Pt and family given demo re w//c management. Pt  brought down to tub room and pt/family given demo re tub transfers with a transfer bench, both indicating understanding with teachback. Recommendations provided re home set up, use of AD/AE in everyday tasks, and the importance of (S) during ADL transfers. Both indicated understanding and asked several questions throughout session. Pt was returned to room and left sitting up in w/c with chair alarm set and quick release belt donned.    OT Discharge Precautions/Restrictions  Precautions Precautions: Fall Precaution Comments: L hemiplegia  Required Braces or Orthoses: Other Brace/Splint Other Brace/Splint: L AFO Restrictions Weight Bearing Restrictions: No   Pain Pain Assessment Pain Scale: 0-10 Pain Score: 0-No pain ADL ADL ADL Comments: Please see functional navigator Vision Baseline Vision/History: No visual deficits Patient Visual Report: No change from baseline Vision Assessment?: No apparent visual deficits Perception  Perception: Within Functional Limits Praxis Praxis: Intact Cognition Overall Cognitive Status: Within Functional Limits for tasks assessed Arousal/Alertness: Awake/alert Orientation Level: Oriented X4 Attention: Alternating Alternating Attention: Appears intact Memory: Appears intact Memory Impairment: Decreased recall of new information Awareness: Appears intact Problem Solving: Impaired Problem Solving Impairment: Functional complex Safety/Judgment: Appears intact Sensation Sensation Light Touch: Appears Intact Proprioception: Appears Intact Additional Comments: sensation grossly intact  Coordination Gross Motor Movements are Fluid and Coordinated: No Fine Motor Movements are Fluid and Coordinated: No Coordination and Movement Description: L UE grossly impaired  Finger Nose Finger Test: Unable to administer Heel Shin Test: impaired L LE Motor  Motor Motor: Hemiplegia Motor - Skilled Clinical Observations: L hemiparesis Mobility   Transfers Transfers: Stand to Sit;Sit to Stand Sit to Stand: 5: Supervision Stand to Sit: 5: Supervision  Trunk/Postural Assessment  Cervical Assessment Cervical Assessment: Within Functional Limits Thoracic Assessment Thoracic Assessment:  Within Functional Limits Lumbar Assessment Lumbar Assessment: Within Functional Limits Postural Control Postural Control: Within Functional Limits  Balance Balance Balance Assessed: No Standardized Balance Assessment Standardized Balance Assessment: Berg Balance Test Berg Balance Test Sit to Stand: Able to stand without using hands and stabilize independently Standing Unsupported: Able to stand safely 2 minutes Sitting with Back Unsupported but Feet Supported on Floor or Stool: Able to sit safely and securely 2 minutes Stand to Sit: Controls descent by using hands Transfers: Able to transfer safely, minor use of hands Standing Unsupported with Eyes Closed: Able to stand 10 seconds with supervision Standing Ubsupported with Feet Together: Able to place feet together independently and stand for 1 minute with supervision From Standing, Reach Forward with Outstretched Arm: Can reach forward >12 cm safely (5") From Standing Position, Pick up Object from Floor: Able to pick up shoe, needs supervision From Standing Position, Turn to Look Behind Over each Shoulder: Turn sideways only but maintains balance Turn 360 Degrees: Able to turn 360 degrees safely but slowly Standing Unsupported, Alternately Place Feet on Step/Stool: Needs assistance to keep from falling or unable to try Standing Unsupported, One Foot in Front: Able to plae foot ahead of the other independently and hold 30 seconds Standing on One Leg: Tries to lift leg/unable to hold 3 seconds but remains standing independently Total Score: 39 Static Sitting Balance Static Sitting - Level of Assistance: 6: Modified independent (Device/Increase time) Dynamic Sitting Balance Dynamic Sitting -  Level of Assistance: 5: Stand by assistance Static Standing Balance Static Standing - Level of Assistance: 5: Stand by assistance Dynamic Standing Balance Dynamic Standing - Level of Assistance: 5: Stand by assistance Extremity/Trunk Assessment RUE Assessment RUE Assessment: Within Functional Limits LUE Assessment LUE Assessment: Exceptions to WFL LUE Tone LUE Tone: Flaccid LUE Tone Comments: Brunnstrom Stage II   See Function Navigator for Current Functional Status.  Curtis Sites 03/08/2018, 12:20 PM

## 2018-03-07 NOTE — Progress Notes (Signed)
Physical Therapy Session Note  Patient Details  Name: Jocelyn [name] MRN: [id number] Date of Birth: [birth year]  Today's Date: 03/07/2018 PT Individual Time: 1415-1525 PT Individual Time Calculation (min): 70 min   Short Term Goals: Week 3:  PT Short Term Goal 1 (Week 3): =LTGs due to ELOS  Skilled Therapeutic Interventions/Progress Updates:    Pt supine in bed upon PT arrival, agreeable to therapy tx and denies pain. Pt reports that she still has not eaten lunch, her meal had not yet come up from the kitchen. Therapist followed up with RN about this, pt agreeable to go to therapy. Pt transferred supine>sitting EOB with supervision and transferred stand pivot from bed>w/c stand pivot with supervision. Pt donned shoes and L AFO with assist for donning AFO. Pt transported to the gym in w/c. Pt ambulated 2 x 80 ft with RW and supervision, emphasis on gait with symmetric step length and L LE heel strike. Pt ascended/descended 4 inch platform step with RW and min assist, verbal cues for techniques and sequencing. Pt ambulated from mat to the steps x 20 ft with RW and supervision, ascended/descended 4 steps using R handrail and min assist, verbal cues for techniques. Pt ambulated back to mat with RW and supervision x 20 ft. Continued education with pt regarding safe ambulation and transfers, pt receptive to feedback. Pt ambulated from gym back to room with RW and supervision x 180 ft, occasional verbal cues for L heel strike and L attention. Pt left seated EOB with bed alarm set, eating lunch and needs in reach.   Therapy Documentation Precautions:  Precautions Precautions: Fall Precaution Comments: L hemiplegia  Required Braces or Orthoses: Other Brace/Splint Other Brace/Splint: L AFO Restrictions Weight Bearing Restrictions: No   See Function Navigator for Current Functional Status.   Therapy/Group: Individual Therapy  Cresenciano Genre, PT, DPT 03/07/2018, 7:56 AM

## 2018-03-07 NOTE — Progress Notes (Signed)
Occupational Therapy Session Note  Patient Details  Name: Jocelyn [name] MRN: [id number] Date of Birth: 26-Mar-1966  Today's Date: 03/07/2018 OT Individual Time: 1000-1100 OT Individual Time Calculation (min): 60 min   Short Term Goals: Week 2:  OT Short Term Goal 1 (Week 2): STG=LTG 2/2 ELOS  Skilled Therapeutic Interventions/Progress Updates:   Pt greeted semi-reclined in bed and agreeable to OT. Stand-pivot to wc with supervision and verbal cues as pt turned the opposite way and made transfer less safe and more difficult. Reviewed safety precautions within BADL tasks with pt. Pt requests to wash off at the sink today. OT provided set-up A and hand over hand A to R UE for neuro re-ed within bathing tasks. Supervision with sit<>stand and verbal cues for R LE placement. Close supervision with intermittent min guard A for dynamic balance when reaching to pull up pants. 1:1 NMES applied to wrist extensors. Ratio 1:1 Rate 35 pps Waveform- Asymmetric Ramp 1.0 Pulse 300 Intensity- 15 Duration -  15  Report of pain at the beginning of session 0 Report of pain at the end of session 0  No adverse reactions after treatment and is skin intact.   Pt left seated on therapy mat for handoff to PT.    Therapy Documentation Precautions:  Precautions Precautions: Fall Precaution Comments: L hemiplegia  Required Braces or Orthoses: Other Brace/Splint Other Brace/Splint: L AFO Restrictions Weight Bearing Restrictions: No Pain: Pain Assessment Pain Score: 0-No pain ADL: ADL ADL Comments: Please see functional navigator  See Function Navigator for Current Functional Status.   Therapy/Group: Individual Therapy  Mal Amabile 03/07/2018, 10:25 AM

## 2018-03-07 NOTE — Progress Notes (Signed)
Social Work  Discharge Note  The overall goal for the admission was met for:   Discharge location: Yes-HOME WITH HUSBAND WHO BETWEEN HE AND HIS SISTER CAN PROVIDE 24 HR SUPERVISION.  Length of Stay: No-20 DAYS  Discharge activity level: Yes-SUPERVISION LEVEL  Home/community participation: Yes  Services provided included: MD, RD, PT, OT, SLP, RN, CM, TR, Pharmacy, Neuropsych and SW  Financial Services: Private Insurance: [facility]  Follow-up services arranged: Outpatient: [facility]-PT & OT MAY 6 12:45-2:45, DME: [facility]-WHEELCHAIR, ROLLING WALKER, 3IN1 & TB BENCH and Patient/Family has no preference for HH/DME agencies  Comments (or additional information):HUSBAND HAS BEEN HERE TO GO THROUGH THERAPIES WITH PT AND AWARE OF HER NEED FOR 24 HR SUPERVISION AT DC  Patient/Family verbalized understanding of follow-up arrangements: Yes  Individual responsible for coordination of the follow-up plan: SELF & RONNELL-HUSBAND  Confirmed correct DME delivered: [name] 03/07/2018    [name]

## 2018-03-07 NOTE — Progress Notes (Signed)
[facility]IC[facility] & REHABILITATION     PROGRESS NOTE  Subjective/Complaints:  No complaints of hip pain today she is unaware of her discharge tomorrow she thought it was Thursday.  She states that she can get a ride tomorrow  ROS: Patient denies fever, rash, sore throat, blurred vision, nausea, vomiting, diarrhea, cough, shortness of breath or chest pain, joint or back pain, headache, or mood change.    Objective: Vital Signs: Blood pressure 107/68, pulse 72, temperature 98.4 F (36.9 C), temperature source Oral, resp. rate 18, height  (1.702 m), weight 84.1 kg (185 lb 6.5 oz), SpO2 99 %. No results found. No results for input(s): WBC, HGB, HCT, PLT in the last 72 hours. Recent Labs    03/06/18 1243  NA 140  K 4.0  CL 103  GLUCOSE 101*  BUN 11  CREATININE 0.88  CALCIUM 9.4   CBG (last 3)  No results for input(s): GLUCAP in the last 72 hours.  Wt Readings from Last 3 Encounters:  03/01/18 84.1 kg (185 lb 6.5 oz)  02/13/18 81.2 kg (179 lb 0.2 oz)  07/21/17 80.3 kg (177 lb)    Physical Exam:  BP 107/68 (BP Location: Right Arm)   Pulse 72   Temp 98.4 F (36.9 C) (Oral)   Resp 18   Ht  (1.702 m)   Wt 84.1 kg (185 lb 6.5 oz)   SpO2 99%   BMI 29.04 kg/m  Constitutional: No distress . Vital signs reviewed. HEENT: EOMI, oral membranes moist Neck: supple Cardiovascular: RRR without murmur. No JVD    Respiratory: CTA Bilaterally without wheezes or rales. Normal effort    GI: BS +, non-tender, non-distended   Musculoskeletal: She exhibits no edema, no tenderness. Neurological: She is alert and oriented Left facial weakness.  Able to follow simple one step commands without difficulty.  Motor:  LUE: 2-/5 shoulder abduction, elbow flex/ext, 1/5 finger flex (stable exam)  LLE: HF 2-/5, and KE, 0/5 ADF , 2- toe ext Minimal tone in left arm or leg today. Skin: Skin is warm and dry.  Psychiatric: Pleasant and cooperative   Assessment/Plan: 1.  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 Physiatrist is providing close team supervision and 24 hour management of active medical problems listed below. Physiatrist and rehab team continue to assess barriers to discharge/monitor patient progress toward functional and medical goals.  Function:  Bathing Bathing position Bathing activity did not occur: N/A Position: Wheelchair/chair at sink  Bathing parts Body parts bathed by patient: Left arm, Abdomen, Chest, Front perineal area, Right upper leg, Left upper leg, Left lower leg, Right lower leg, Buttocks, Right arm Body parts bathed by helper: Right arm  Bathing assist Assist Level: Supervision or verbal cues, Touching or steadying assistance(Pt > 75%)      Upper Body Dressing/Undressing Upper body dressing   What is the patient wearing?: Bra, Pull over shirt/dress Bra - Perfomed by patient: Thread/unthread left bra strap, Thread/unthread right bra strap Bra - Perfomed by helper: Hook/unhook bra (pull down sports bra) Pull over shirt/dress - Perfomed by patient: Thread/unthread right sleeve, Put head through opening, Thread/unthread left sleeve, Pull shirt over trunk Pull over shirt/dress - Perfomed by helper: Pull shirt over trunk        Upper body assist Assist Level: Touching or steadying assistance(Pt > 75%)      Lower Body Dressing/Undressing Lower body dressing  What is the patient wearing?: Underwear, Pants, Non-skid slipper socks Underwear - Performed by patient: Thread/unthread right underwear leg, Pull underwear up/down, Thread/unthread left underwear leg Underwear - Performed by helper: Pull underwear up/down Pants- Performed by patient: Thread/unthread left pants leg, Pull pants up/down, Thread/unthread right pants leg Pants- Performed by helper: Pull pants up/down Non-skid slipper socks- Performed  by patient: Don/doff left sock, Don/doff right sock Non-skid slipper socks- Performed by helper: Don/doff right sock, Don/doff left sock                  Lower body assist Assist for lower body dressing: Touching or steadying assistance (Pt > 75%)      Toileting Toileting Toileting activity did not occur: No continent bowel/bladder event Toileting steps completed by patient: Adjust clothing prior to toileting, Performs perineal hygiene, Adjust clothing after toileting Toileting steps completed by helper: Adjust clothing after toileting, Adjust clothing prior to toileting Toileting Assistive Devices: Grab bar or rail  Toileting assist Assist level: Touching or steadying assistance (Pt.75%)   Transfers Chair/bed transfer   Chair/bed transfer method: Stand pivot Chair/bed transfer assist level: Touching or steadying assistance (Pt > 75%) Chair/bed transfer assistive device: Armrests     Locomotion Ambulation     Max distance: 120 ft Assist level: Touching or steadying assistance (Pt > 75%)   Wheelchair   Type: Manual Max wheelchair distance: 150' Assist Level: Supervision or verbal cues  Cognition Comprehension Comprehension assist level: Follows complex conversation/direction with extra time/assistive device  Expression Expression assist level: Expresses complex ideas: With extra time/assistive device  Social Interaction Social Interaction assist level: Interacts appropriately with others with medication or extra time (anti-anxiety, antidepressant).  Problem Solving Problem solving assist level: Solves complex problems: With extra time  Memory Memory assist level: Recognizes or recalls 90% of the time/requires cueing < 10% of the time     Medical Problem List and Plan: 1.  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   Cont CIR PT, OT- discussed E stim with pt and husband , plan discharge in a.m. 2.  DVT  Prophylaxis/Anticoagulation: Mechanical: Sequential compression devices, below knee Bilateral lower extremities 3. Pain Management: N/A. Left troch bursitis improved with voltaren 4. Mood: LCSW to follow for evaluation and support.  5. Neuropsych: This patient is not fully capable of making decisions on her own behalf. 6. Skin/Wound Care:  Routine pressure relief measures.  7. Fluids/Electrolytes/Nutrition: Monitor I/Os.  8. HTN: Monitor BP bid. Continue Norvasc. Titrate Prinivil upwards for tighter control.     Vitals:   03/06/18 2026 03/07/18 0517  BP: 106/80 107/68  Pulse:  72  Resp:  18  Temp:  98.4 F (36.9 C)  SpO2:  99%   Controlled 4/30 9.  Acute on chronic anemia: Continue iron supplement. Per pt not compliant at home with this   Hemoglobin 10.0 on 4/26  Stool guaic,4/18 negative 10.  Constipation -    Adjust bowel reg as necessary 11.  Hypokalemia resolved  Potassium 4.0 on 4/29   Supplemented x1 day  Continue to monitor  LOS (Days) 18 A FACE TO FACE EVALUATION WAS PERFORMED  Erick Colace 03/07/2018 10:14 AM

## 2018-03-08 ENCOUNTER — Inpatient Hospital Stay (HOSPITAL_COMMUNITY): Payer: 59

## 2018-03-08 LAB — BASIC METABOLIC PANEL
ANION GAP: 7 (ref 5–15)
BUN: 13 mg/dL (ref 6–20)
CALCIUM: 9.1 mg/dL (ref 8.9–10.3)
CO2: 29 mmol/L (ref 22–32)
Chloride: 107 mmol/L (ref 101–111)
Creatinine, Ser: 0.91 mg/dL (ref 0.44–1.00)
Glucose, Bld: 90 mg/dL (ref 65–99)
Potassium: 4.1 mmol/L (ref 3.5–5.1)
SODIUM: 143 mmol/L (ref 135–145)

## 2018-03-08 NOTE — Patient Care Conference (Signed)
Inpatient RehabilitationTeam Conference and Plan of Care Update Date: 03/08/2018   Time: 11:00 AM    Patient Name: Jocelyn [name]      Medical Record Number: [id number]  Date of Birth: 05/03/1966 Sex: Female         Room/Bed: 4W17C/4W17C-01 Payor Info: Payor: [employer] / Plan: [employer] OTHER / Product Type: *No Product type* /    Admitting Diagnosis: R BG Hem  Admit Date/Time:  02/17/2018  6:38 PM Admission Comments: No comment available   Primary Diagnosis:  <principal problem not specified> Principal Problem: <principal problem not specified>  Patient Active Problem List   Diagnosis Date Noted  . Slow transit constipation   . Greater trochanteric bursitis of left hip   . Acute blood loss anemia   . Flaccid monoplegia of upper extremity (HCC)   . Hyperlipemia 02/16/2018  . Cerebral edema (HCC) 02/16/2018  . Benign essential HTN   . Anemia of chronic disease   . Amphetamine abuse (HCC)   . Hypokalemia   . 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 Pneumonia, community acquired 09/23/2015  . Microcytic hypochromic anemia 09/23/2015  . Tobacco abuse 09/23/2015  . Pneumonia 09/23/2015  . Nicotine addiction 12/09/2013  . Microcytic anemia 12/09/2013  . HTN (hypertension) 12/09/2013  . BMI 36.0-36.9,adult 12/09/2013    Expected Discharge Date: Expected Discharge Date: 03/09/18  Team Members Present: Physician leading conference: Dr. Claudette Laws Social Worker Present: Dossie Der, LCSW Nurse Present: Tennis Must, RN PT Present: Woodfin Ganja, PT OT Present: Roney Mans, OT SLP Present: Feliberto Gottron, SLP PPS Coordinator present : Tora Duck, RN, CRRN     Current Status/Progress Goal Weekly Team Focus  Medical   Left hemiparesis, cognitive deficits, blood pressure controlled  Secondary stroke prevention  Discharge planning   Bowel/Bladder   cont b/b; LBM 4/09  maintain with min assist  assess q shift and prn    Swallow/Nutrition/ Hydration             ADL's   Min A/supervision  Min A/supervision, a few Mod I goals  L U NMR, pt/family education, dc planning, modified bathing/dressing, NMES, standing balance, transfers   Mobility   supervision to min guard overall, occasional assist for LOB, gait w/ RW 100-150' using LAFO  supervision  discharge planning, household mobility, family education, LLE NMR/strengthening   Communication             Safety/Cognition/ Behavioral Observations            Pain   scheduled voltaren gel for mild pain to L hip  <2  assess qshift and prn   Skin   no skin issues  maintain skin integrity  assess q shift and PRN      *See Care Plan and progress notes for long and short-term goals.     Barriers to Discharge  Current Status/Progress Possible Resolutions Date Resolved   Physician    Medical stability     Good progress, should be ready for discharge in a.m.  See above      Nursing                  PT        husband and pt's nephew going to make sure pt as 24/7 supervision           OT                  SLP  SW                Discharge Planning/Teaching Needs:  Between hsband and his sister can provide 24 hr supervision. Husband has been in for education. Ready for DC tomorrow      Team Discussion:  Pt reaching her goals of supervision level. Family education completed with husband. Medically stable for DC tomorrow.  Revisions to Treatment Plan:  DC 5/2    Continued Need for Acute Rehabilitation Level of Care: The patient requires daily medical management by a physician with specialized training in physical medicine and rehabilitation for the following conditions: Daily direction of a multidisciplinary physical rehabilitation program to ensure safe treatment while eliciting the highest outcome that is of practical value to the patient.: Yes Daily medical management of patient stability for increased activity during participation in  an intensive rehabilitation regime.: Yes Daily analysis of laboratory values and/or radiology reports with any subsequent need for medication adjustment of medical intervention for : Neurological problems;Blood pressure problems  Jocelyn [name], Lemar Livings 03/08/2018, 11:46 AM

## 2018-03-08 NOTE — Progress Notes (Signed)
Physical Therapy Discharge Summary  Patient Details  Name: Jocelyn [name] MRN: [id number] Date of Birth: Jul 11, 1966  Today's Date: 03/08/2018 PT Individual Time: 1001-1100 AND 1415-1525 PT Individual Time Calculation (min): 59 min AND 70 min   Patient has met 7 of 7 long term goals due to improved activity tolerance, improved balance, improved postural control, functional use of  left upper extremity and left lower extremity and improved awareness.  Patient to discharge at an ambulatory level Supervision.   Patient's care partner is independent to provide the necessary supervision assistance at discharge. Pt's husband has been present for multiple sessions, has been educated on appropriate cues to provide the pt for safety during ambulation.   All Goals met.   Recommendation:  Patient will benefit from ongoing skilled PT services in outpatient setting to continue to advance safe functional mobility, address ongoing impairments in balance, L UE/LE strength, gait, coordination, and minimize fall risk.  Equipment: RW and w/c  Reasons for discharge: treatment goals met  Patient/family agrees with progress made and goals achieved: Yes   PT Treatment Interventions: Session 1: Pt seated in w/c upon PT arrival, agreeable to therapy tx and denies pain. Pt ambulated from room>gym x 150 ft with RW and supervision and L AFO, verbal cues for L foot clearance and gait speed. Discharge summary completed as detailed below, pt participated in berg balance test and discussed results with pt. Pt ambulated x 80 ft within the gym to the steps with RW. Pt ascended/descended 12 steps with R handrail and min assist, verbal cues for step to pattern and safety. Pt ambulated from gym>ortho gym with RW and supervision x 100 ft. Pt performed car transfer with supervision and RW. Pt ambulated from ortho gym>gym x 100 ft and then back to room x 150 ft with RW and supervision. Pt left seated EOB with bed alarm set and  needs in reach.   Session 2: Pt supine in bed upon PT arrival, agreeable to therapy tx and denies pain. Pt transferred to sitting EOB Mod I. Pt ambulated from room>gym x 150 ft with RW and supervision, occasional verbal cues for L LE step length/foot clearance. Pt seated on physioball worked on core strengthening and postural control to perform pelvic tilts anterior/posterior, lateral weightshifts, alternating hip flexion, mini sit ups and lateral trunk flexion, manual facilitation for pelvic movements and min assist to correct LOB left. Pt transferred to quadruped on mat with min assist, once in the position, pt requiring stabilization at L wrist and elbow for increased weightbearing through UE. In quadruped pt performed lateral weightshifts from L UE to R UE, forward/backward weightshifts from LEs to UEs, partial push-ups and reaching with R UE to stack cups. 2 x 5 min in quadruped position, emphasis on L UE weightbearing. Pt performed x 10 sit<>stands without UE support, emphasis on LE symmetric weightbearing. Pt ambulated back to room with RW and supervision, left seated in w/c at end of session with needs in reach and chair alarm set.      PT Discharge Precautions/Restrictions Precautions Precautions: Fall Precaution Comments: L hemiplegia  Required Braces or Orthoses: Other Brace/Splint Other Brace/Splint: L AFO Restrictions Weight Bearing Restrictions: No Cognition Arousal/Alertness: Awake/alert Orientation Level: Oriented X4 Alternating Attention: Appears intact Memory: Appears intact Awareness: Appears intact Safety/Judgment: Appears intact Sensation Sensation Light Touch: Appears Intact Proprioception: Appears Intact Additional Comments: sensation grossly intact B LEs Coordination Gross Motor Movements are Fluid and Coordinated: No Fine Motor Movements are Fluid and Coordinated: No  Coordination and Movement Description: coordination impaired secondary to L LE  weakness/hemiparesis Heel Shin Test: impaired L LE Motor  Motor Motor: Hemiplegia Motor - Skilled Clinical Observations: L hemiparesis  Trunk/Postural Assessment  Cervical Assessment Cervical Assessment: Within Functional Limits Thoracic Assessment Thoracic Assessment: Within Functional Limits Lumbar Assessment Lumbar Assessment: Within Functional Limits Postural Control Postural Control: Within Functional Limits  Balance Balance Balance Assessed: Yes Standardized Balance Assessment Standardized Balance Assessment: Berg Balance Test Berg Balance Test Sit to Stand: Able to stand without using hands and stabilize independently Standing Unsupported: Able to stand safely 2 minutes Sitting with Back Unsupported but Feet Supported on Floor or Stool: Able to sit safely and securely 2 minutes Stand to Sit: Controls descent by using hands Transfers: Able to transfer safely, minor use of hands Standing Unsupported with Eyes Closed: Able to stand 10 seconds with supervision Standing Ubsupported with Feet Together: Able to place feet together independently and stand for 1 minute with supervision From Standing, Reach Forward with Outstretched Arm: Can reach forward >12 cm safely (5") From Standing Position, Pick up Object from Floor: Able to pick up shoe, needs supervision From Standing Position, Turn to Look Behind Over each Shoulder: Turn sideways only but maintains balance Turn 360 Degrees: Able to turn 360 degrees safely but slowly Standing Unsupported, Alternately Place Feet on Step/Stool: Needs assistance to keep from falling or unable to try Standing Unsupported, One Foot in Front: Able to plae foot ahead of the other independently and hold 30 seconds Standing on One Leg: Tries to lift leg/unable to hold 3 seconds but remains standing independently Total Score: 39 Static Sitting Balance Static Sitting - Level of Assistance: 6: Modified independent (Device/Increase time) Dynamic  Sitting Balance Dynamic Sitting - Level of Assistance: 5: Stand by assistance Static Standing Balance Static Standing - Level of Assistance: 5: Stand by assistance Dynamic Standing Balance Dynamic Standing - Level of Assistance: 5: Stand by assistance Extremity Assessment  RLE Assessment RLE Assessment: Within Functional Limits LLE Strength Left Hip Flexion: 3+/5 Left Hip Extension: 3+/5 Left Knee Flexion: 3+/5 Left Knee Extension: 4-/5 Left Ankle Dorsiflexion: 2-/5 Left Ankle Plantar Flexion: 2-/5   See Function Navigator for Current Functional Status.  Netta Corrigan, PT, DPT 03/08/2018, 10:26 AM

## 2018-03-08 NOTE — Progress Notes (Signed)
[facility]IC[facility] & RE[facility]ABILITATION     PROGRESS NOTE  Subjective/Complaints:   No issues overnite, exceited about d/c in am, has worked on car transfers  ROS: Patient denies fever, rash, sore throat, blurred vision, nausea, vomiting, diarrhea, cough, shortness of breath or chest pain, joint or back pain, headache, or mood change.    Objective: Vital Signs: Blood pressure 106/70, pulse 70, temperature 98 F (36.7 C), temperature source Oral, resp. rate 16, height 5' 7" (1.702 m), weight 84.1 kg (185 lb 6.5 oz), SpO2 100 %. No results found. No results for input(s): WBC, HGB, HCT, PLT in the last 72 hours. Recent Labs    03/06/18 1243 03/08/18 0607  NA 140 143  K 4.0 4.1  CL 103 107  GLUCOSE 101* 90  BUN 11 13  CREATININE 0.88 0.91  CALCIUM 9.4 9.1   CBG (last 3)  No results for input(s): GLUCAP in the last 72 hours.  Wt Readings from Last 3 Encounters:  03/01/18 84.1 kg (185 lb 6.5 oz)  02/13/18 81.2 kg (179 lb 0.2 oz)  07/21/17 80.3 kg (177 lb)    Physical Exam:  BP 106/70 (BP Location: Right Arm)   Pulse 70   Temp 98 F (36.7 C) (Oral)   Resp 16   Ht 5' 7" (1.702 m)   Wt 84.1 kg (185 lb 6.5 oz)   SpO2 100%   BMI 29.04 kg/m  Constitutional: No distress . Vital signs reviewed. HEENT: EOMI, oral membranes moist Neck: supple Cardiovascular: RRR without murmur. No JVD    Respiratory: CTA Bilaterally without wheezes or rales. Normal effort    GI: BS +, non-tender, non-distended   Musculoskeletal: She exhibits no edema, no tenderness. Neurological: She is alert and oriented Left facial weakness.  Able to follow simple one step commands without difficulty.  Motor:  LUE: 2-/5 shoulder abduction, elbow flex/ext, 1/5 finger flex (stable exam)  LLE: HF 2-/5, and KE, 0/5 ADF , 2- toe ext Minimal tone in left arm or leg today. Skin: Skin is warm and dry.  Psychiatric: Pleasant and cooperative   Assessment/Plan: 1. Functional deficits secondary to right  basal ganglia hemorrhage which require 3+ hours per day of interdisciplinary therapy in a comprehensive inpatient rehab setting. Physiatrist is providing close team supervision and 24 hour management of active medical problems listed below. Physiatrist and rehab team continue to assess barriers to discharge/monitor patient progress toward functional and medical goals.  Function:  Bathing Bathing position Bathing activity did not occur: N/A Position: Shower  Bathing parts Body parts bathed by patient: Left arm, Abdomen, Chest, Front perineal area, Right upper leg, Left upper leg, Left lower leg, Right lower leg, Buttocks, Right arm Body parts bathed by helper: Right arm  Bathing assist Assist Level: Touching or steadying assistance(Pt > 75%)      Upper Body Dressing/Undressing Upper body dressing   What is the patient wearing?: Bra, Pull over shirt/dress Bra - Perfomed by patient: Thread/unthread left bra strap, Thread/unthread right bra strap, Hook/unhook bra (pull down sports bra) Bra - Perfomed by helper: Hook/unhook bra (pull down sports bra) Pull over shirt/dress - Perfomed by patient: Thread/unthread right sleeve, Thread/unthread left sleeve, Put head through opening, Pull shirt over trunk Pull over shirt/dress - Perfomed by helper: Pull shirt over trunk        Upper body assist Assist Level: Supervision or verbal cues      Lower Body Dressing/Undressing Lower body dressing   What is the patient wearing?:  Underwear, Pants, Socks, Shoes, AFO Underwear - Performed by patient: Thread/unthread right underwear leg, Thread/unthread left underwear leg, Pull underwear up/down Underwear - Performed by helper: Pull underwear up/down Pants- Performed by patient: Thread/unthread right pants leg, Thread/unthread left pants leg, Pull pants up/down Pants- Performed by helper: Pull pants up/down Non-skid slipper socks- Performed by patient: Don/doff left sock, Don/doff right sock Non-skid  slipper socks- Performed by helper: Don/doff right sock, Don/doff left sock Socks - Performed by patient: Don/doff left sock, Don/doff right sock   Shoes - Performed by patient: Don/doff right shoe, Fasten right Shoes - Performed by helper: Don/doff left shoe, Fasten left   AFO - Performed by helper: Don/doff left AFO      Lower body assist Assist for lower body dressing: Touching or steadying assistance (Pt > 75%)      Toileting Toileting Toileting activity did not occur: No continent bowel/bladder event Toileting steps completed by patient: Adjust clothing after toileting Toileting steps completed by helper: Adjust clothing prior to toileting, Performs perineal hygiene Toileting Assistive Devices: Grab bar or rail  Toileting assist Assist level: Touching or steadying assistance (Pt.75%)   Transfers Chair/bed transfer   Chair/bed transfer method: Stand pivot, Ambulatory Chair/bed transfer assist level: Supervision or verbal cues Chair/bed transfer assistive device: Armrests, Medical sales representative     Max distance: 180 ft Assist level: Supervision or verbal cues   Wheelchair   Type: Manual Max wheelchair distance: 150' Assist Level: Supervision or verbal cues  Cognition Comprehension Comprehension assist level: Follows complex conversation/direction with extra time/assistive device  Expression Expression assist level: Expresses complex 90% of the time/cues < 10% of the time  Social Interaction Social Interaction assist level: Interacts appropriately 90% of the time - Needs monitoring or encouragement for participation or interaction.  Problem Solving Problem solving assist level: Solves complex 90% of the time/cues < 10% of the time  Memory Memory assist level: Recognizes or recalls 90% of the time/requires cueing < 10% of the time     Medical Problem List and Plan: 1.  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   Team conference today please see physician documentation under team conference tab, met with team face-to-face to discuss problems,progress, and goals. Formulized individual treatment plan based on medical history, underlying problem and comorbidities., plan discharge in a.m. 2.  DVT Prophylaxis/Anticoagulation: Mechanical: Sequential compression devices, below knee Bilateral lower extremities 3. Pain Management: N/A. Left troch bursitis improved with voltaren 4. Mood: LCSW to follow for evaluation and support.  5. Neuropsych: This patient is not fully capable of making decisions on her own behalf. 6. Skin/Wound Care:  Routine pressure relief measures.  7. Fluids/Electrolytes/Nutrition: Monitor I/Os.  8. HTN: Monitor BP bid. Continue Norvasc. Titrate Prinivil upwards for tighter control.     Vitals:   03/07/18 1442 03/08/18 0559  BP: 122/77 106/70  Pulse: 74 70  Resp:  16  Temp:  98 F (36.7 C)  SpO2: 100% 100%   Controlled 5/1 9.  Acute on chronic anemia: Continue iron supplement. Per pt not compliant at home with this   Hemoglobin 10.0 on 4/26  Stool guaic,4/18 negative 10.  Constipation -    Adjust bowel reg as necessary 11.  Hypokalemia resolved  Potassium 4.0 on 4/29, 4.1 on 5/1 no repeat is needed   LOS (Days) 19 A FACE TO FACE EVALUATION WAS PERFORMED  Charlett Blake 03/08/2018 8:30 AM

## 2018-03-08 NOTE — Progress Notes (Signed)
Pt has refused  Senokot. Will try MiraLax after therapy session.

## 2018-03-09 ENCOUNTER — Telehealth (HOSPITAL_COMMUNITY): Payer: Self-pay | Admitting: Physical Medicine and Rehabilitation

## 2018-03-09 LAB — OCCULT BLOOD X 1 CARD TO LAB, STOOL: Fecal Occult Bld: NEGATIVE

## 2018-03-09 MED ORDER — CYANOCOBALAMIN 1000 MCG PO TABS: 1000.0000 ug | ORAL_TABLET | Freq: Every day | ORAL | 0 refills | Status: AC

## 2018-03-09 MED ORDER — AMLODIPINE BESYLATE 10 MG PO TABS: 10.0000 mg | ORAL_TABLET | Freq: Every day | ORAL | 0 refills | Status: DC

## 2018-03-09 MED ORDER — DICLOFENAC SODIUM 1 % TD GEL: 2.0000 g | Freq: Four times a day (QID) | TRANSDERMAL | 0 refills | Status: AC

## 2018-03-09 MED ORDER — SENNOSIDES-DOCUSATE SODIUM 8.6-50 MG PO TABS: 1.0000 | ORAL_TABLET | Freq: Two times a day (BID) | ORAL | 0 refills | Status: DC

## 2018-03-09 MED ORDER — LISINOPRIL 20 MG PO TABS: 20.0000 mg | ORAL_TABLET | Freq: Two times a day (BID) | ORAL | 0 refills | Status: DC

## 2018-03-09 MED ORDER — FLUOXETINE HCL 10 MG PO CAPS: 10.0000 mg | ORAL_CAPSULE | Freq: Every day | ORAL | 0 refills | Status: DC

## 2018-03-09 MED ORDER — FERROUS SULFATE 325 (65 FE) MG PO TABS: 325.0000 mg | ORAL_TABLET | Freq: Three times a day (TID) | ORAL | 0 refills | Status: AC

## 2018-03-09 NOTE — Discharge Summary (Signed)
Physician Discharge Summary  Patient ID: Jocelyn [name] MRN: [id number] DOB/AGE: Jan 05, 1966 52 y.o.  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 Active Problems:   Benign essential HTN   Acute blood loss anemia   Flaccid monoplegia of upper extremity (HCC)   Slow transit constipation   Greater trochanteric bursitis of left hip   Discharged Condition:  Stable   Significant Diagnostic Studies: N/A   Labs:  Basic Metabolic Panel: Recent Labs  Lab 03/03/18 0456 03/06/18 1243 03/08/18 0607  NA 139 140 143  K 3.5 4.0 4.1  CL 105 103 107  CO2 GLUCOSE 88 101* 90  BUN CREATININE 0.69 0.88 0.91  CALCIUM 8.9 9.4 9.1    CBC: CBC Latest Ref Rng & Units 03/03/2018 02/22/2018 02/18/2018  WBC 4.0 - 10.5 K/uL 6.8 5.4 8.8  Hemoglobin 12.0 - 15.0 g/dL 10.0(L) 9.3(L) 9.1(L)  Hematocrit 36.0 - 46.0 % 34.4(L) 33.0(L) 32.3(L)  Platelets 150 - 400 K/uL 424(H) 392 451(H)    CBG: No results for input(s): GLUCAP in the last 168 hours.  Brief HPI:   Jocelyn [name]. Blincoe is a 52 year old female with history of untreated hypertension, tobacco use, anemia; who was admitted on 02/13/2018 after being found by family with left-sided weakness, left facial droop and slurred speech.  CT of head done revealing right basal ganglia hemorrhage with mild edema and leftward shift.  Blood pressure was elevated at 148/101 and she was started on clevidipine drip as well as hypertonic saline for edema control.  CT head neck done revealing patent carotid and vertebral artery, no large vessel occlusion, aneurysm or AVM.  She was was weaned off hypertonic saline and started on Norvasc for BP control but blood pressures continue to be labile.  Patient with resultant aphasia left-sided weakness and cognitive deficits.  CIR was recommended [name] to functional deficits.   Hospital Course: Jocelyn [name] was admitted to rehab 02/17/2018 for inpatient therapies to consist of PT, ST and OT at least three hours five days a week. Past admission physiatrist, therapy team and rehab RN have worked together to provide customized collaborative inpatient rehab. Blood pressures have ben monitored on bid basis and Prinivil was titrated upwards to bid for better control. Po intake has been good and renal status is stable.  Hypokalemia has resolved with brief supplementation. Iron supplement was added to help with ABLA anemia and H/H is slowly improving. Stool guaiac 4/18 was heme negative. Left trochanteric bursitis has improved with addition of Voltaren gel.    She is continent of bowel and bladder. Senna was added to help manage constipation. One finger width subluxation left shoulder is stable and no shoulder pain reported.  Patient and husband have been educated on importance of tobacco cessation as well as medical follow up. She has made steady progress to supervision at wheelchair level. She will continue to receive follow up outpatient PT and OT at [facility] to start Monday May 6th.    Rehab course: During patient's stay in rehab weekly team conferences were held to monitor patient's progress, set goals and discuss barriers to discharge. At admission, patient required max assist with ADL tasks and mod assist with mobility. She exhibited minimally dysarthric speech with min impairments with high level cognitive tasks. She  has had improvement in activity tolerance, balance, postural control as well as ability to compensate  for deficits. She has had improvement in functional use LUE  and LLE as well as improvement in awareness. She requires min/guard assist for LB dressing and supervision for bathing and UB dressing. She is able to transfer with supervision and is ambulating 150' with RW and L-AFO.  Speech intelligibility has improved to 100% accuracy and she is able to complete complex problem solving  tasks at modified independent level. Speech therapy signed on 04/17. Her husband has been present for multiple sessions and family to provide assistance as needed after discharge.     Disposition: Home   Diet: Heart healthy.   Special Instructions: 1. No smoking, driving or strenuous activity till cleared by MD.    Discharge Instructions    Ambulatory referral to Physical Medicine Rehab   Complete by:  As directed    1-2 weeks transitional care appt     Allergies as of 03/09/2018   No Known Allergies     Medication List    TAKE these medications   acetaminophen 325 MG tablet Commonly known as:  TYLENOL Take 1-2 tablets (325-650 mg total) by mouth every 4 (four) hours as needed for mild pain. What changed:    medication strength  how much to take  when to take this  reasons to take this   amLODipine 10 MG tablet Commonly known as:  NORVASC Take 1 tablet (10 mg total) by mouth daily.   cyanocobalamin 1000 MCG tablet Take 1 tablet (1,000 mcg total) by mouth daily.   diclofenac sodium 1 % Gel Commonly known as:  VOLTAREN Apply 2 g topically 4 (four) times daily.   ferrous sulfate 325 (65 FE) MG tablet Take 1 tablet (325 mg total) by mouth 3 (three) times daily with meals.   FLUoxetine 10 MG capsule Commonly known as:  PROZAC Take 1 capsule (10 mg total) by mouth daily. Start taking on:  03/10/2018   lisinopril 20 MG tablet Commonly known as:  PRINIVIL,ZESTRIL Take 1 tablet (20 mg total) by mouth 2 (two) times daily.   senna-docusate 8.6-50 MG tablet Commonly known as:  Senokot-S Take 1 tablet by mouth 2 (two) times daily.      Follow-up Information    Magdalene River, PA-C Follow up on 03/23/2018.   Specialties:  Advice worker, Family Medicine Why:  Appointment @ 10:00 AM Contact information: [address] [place] [address] [phone]        Erick Colace, MD Follow up.   Specialty:  Physical Medicine and Rehabilitation Why:   office will call you with follow up appointment Contact information: [address] [place] [address] [phone]        Marvel Plan, MD. Call in 1 day(s).   Specialty:  Neurology Why:  for follow up appointment in 4 weeks Contact information: [address] [place] [address] [phone]           Signed: Jacquelynn Cree 03/09/2018, 2:54 PM

## 2018-03-09 NOTE — Progress Notes (Signed)
[place] PHYSICAL MEDICINE & REHABILITATION     PROGRESS NOTE  Subjective/Complaints:   No issues overnite, needs to go to bathroom Excited about d/c  ROS: Patient denies fever, rash, sore throat, blurred vision, nausea, vomiting, diarrhea, cough, shortness of breath or chest pain, joint or back pain, headache, or mood change.    Objective: Vital Signs: Blood pressure 113/70, pulse 76, temperature 98.4 F (36.9 C), temperature source Oral, resp. rate 16, height  (1.702 m), weight 84.2 kg (185 lb 10 oz), SpO2 100 %. No results found. No results for input(s): WBC, HGB, HCT, PLT in the last 72 hours. Recent Labs    03/06/18 1243 03/08/18 0607  NA 140 143  K 4.0 4.1  CL 103 107  GLUCOSE 101* 90  BUN 11 13  CREATININE 0.88 0.91  CALCIUM 9.4 9.1   CBG (last 3)  No results for input(s): GLUCAP in the last 72 hours.  Wt Readings from Last 3 Encounters:  03/09/18 84.2 kg (185 lb 10 oz)  02/13/18 81.2 kg (179 lb 0.2 oz)  07/21/17 80.3 kg (177 lb)    Physical Exam:  BP 113/70 (BP Location: Right Arm)   Pulse 76   Temp 98.4 F (36.9 C) (Oral)   Resp 16   Ht  (1.702 m)   Wt 84.2 kg (185 lb 10 oz)   SpO2 100%   BMI 29.07 kg/m  Constitutional: No distress . Vital signs reviewed. HEENT: EOMI, oral membranes moist Neck: supple Cardiovascular: RRR without murmur. No JVD    Respiratory: CTA Bilaterally without wheezes or rales. Normal effort    GI: BS +, non-tender, non-distended   Musculoskeletal: She exhibits no edema, no tenderness. Neurological: She is alert and oriented Left facial weakness.  Able to follow simple one step commands without difficulty.  Motor:  LUE: 2-/5 shoulder abduction, elbow flex/ext, 1/5 finger flex (stable exam)  LLE: HF 2-/5, and KE, 0/5 ADF , 2- toe ext Minimal tone in left arm or leg today. Skin: Skin is warm and dry.  Psychiatric: Pleasant and cooperative   Assessment/Plan: 1. Functional deficits secondary to right basal  ganglia hemorrhage Stable for D/C today F/u PCP in 3-4 weeks F/u PM&R 2 weeks See D/C summary See D/C instructions Function:  Bathing Bathing position Bathing activity did not occur: N/A Position: Wheelchair/chair at sink  Bathing parts Body parts bathed by patient: Left arm, Abdomen, Chest, Front perineal area, Right upper leg, Left upper leg, Left lower leg, Right lower leg, Buttocks, Right arm Body parts bathed by helper: Back  Bathing assist Assist Level: Supervision or verbal cues      Upper Body Dressing/Undressing Upper body dressing   What is the patient wearing?: Bra, Pull over shirt/dress Bra - Perfomed by patient: Thread/unthread left bra strap, Thread/unthread right bra strap Bra - Perfomed by helper: Hook/unhook bra (pull down sports bra) Pull over shirt/dress - Perfomed by patient: Thread/unthread right sleeve, Thread/unthread left sleeve, Put head through opening, Pull shirt over trunk Pull over shirt/dress - Perfomed by helper: Pull shirt over trunk        Upper body assist Assist Level: Supervision or verbal cues      Lower Body Dressing/Undressing Lower body dressing   What is the patient wearing?: Underwear, Pants, Socks, Shoes, AFO Underwear - Performed by patient: Thread/unthread right underwear leg, Thread/unthread left underwear leg, Pull underwear up/down Underwear - Performed by helper: Pull underwear up/down Pants- Performed by patient: Thread/unthread right pants leg, Thread/unthread left  pants leg, Pull pants up/down Pants- Performed by helper: Pull pants up/down Non-skid slipper socks- Performed by patient: Don/doff left sock, Don/doff right sock Non-skid slipper socks- Performed by helper: Don/doff right sock, Don/doff left sock Socks - Performed by patient: Don/doff left sock, Don/doff right sock   Shoes - Performed by patient: Don/doff right shoe, Fasten right Shoes - Performed by helper: Don/doff left shoe, Fasten left AFO - Performed by  patient: Don/doff left AFO AFO - Performed by helper: Don/doff left AFO      Lower body assist Assist for lower body dressing: Supervision or verbal cues      Toileting Toileting Toileting activity did not occur: No continent bowel/bladder event Toileting steps completed by patient: Adjust clothing prior to toileting, Performs perineal hygiene, Adjust clothing after toileting Toileting steps completed by helper: Adjust clothing prior to toileting, Performs perineal hygiene Toileting Assistive Devices: Grab bar or rail  Toileting assist Assist level: Supervision or verbal cues   Transfers Chair/bed transfer   Chair/bed transfer method: Stand pivot Chair/bed transfer assist level: No Help, no cues, assistive device, takes more than a reasonable amount of time Chair/bed transfer assistive device: Armrests     Locomotion Ambulation Ambulation activity did not occur: N/A   Max distance: 150 ft Assist level: Supervision or verbal cues   Wheelchair   Type: Manual Max wheelchair distance: 150' Assist Level: Supervision or verbal cues  Cognition Comprehension Comprehension assist level: Understands complex 90% of the time/cues 10% of the time  Expression Expression assist level: Expresses complex 90% of the time/cues < 10% of the time  Social Interaction Social Interaction assist level: Interacts appropriately 90% of the time - Needs monitoring or encouragement for participation or interaction.  Problem Solving Problem solving assist level: Solves basic 90% of the time/requires cueing < 10% of the time  Memory Memory assist level: Recognizes or recalls 90% of the time/requires cueing < 10% of the time     Medical Problem List and Plan: 1.  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   Plan d/c today 2.  DVT Prophylaxis/Anticoagulation: Mechanical: Sequential compression devices, below knee Bilateral lower extremities 3. Pain  Management: N/A. Left troch bursitis improved with voltaren 4. Mood: LCSW to follow for evaluation and support.  5. Neuropsych: This patient is not fully capable of making decisions on her own behalf. 6. Skin/Wound Care:  Routine pressure relief measures.  7. Fluids/Electrolytes/Nutrition: Monitor I/Os.  8. HTN: Monitor BP bid. Continue Norvasc. Titrate Prinivil upwards for tighter control.     Vitals:   03/08/18 2132 03/09/18 0542  BP: 116/78 113/70  Pulse:  76  Resp:  16  Temp:  98.4 F (36.9 C)  SpO2:  100%   Controlled 5/2 9.  Acute on chronic anemia: Continue iron supplement. Per pt not compliant at home with this   Hemoglobin 10.0 on 4/26  Stool guaic,4/18 negative 10.  Constipation -    Adjust bowel reg as necessary 11.  Hypokalemia resolved  Potassium 4.0 on 4/29, 4.1 on 5/1 no repeat is needed   LOS (Days) 20 A FACE TO FACE EVALUATION WAS PERFORMED  Erick Colace 03/09/2018 8:24 AM

## 2018-03-09 NOTE — Discharge Instructions (Signed)
Inpatient Rehab Discharge Instructions  Jocelyn [name] Discharge date and time:  03/09/18  Activities/Precautions/ Functional Status: Activity: No lifting, driving, or strenuous exercise till cleared by MD Diet: cardiac diet Wound Care: none needed    Functional status:  ___ No restrictions     ___ Walk up steps independently _X__ 24/7 supervision/assistance   ___ Walk up steps with assistance ___ Intermittent supervision/assistance  ___ Bathe/dress independently ___ Walk with walker     _X__ Bathe/dress with assistance ___ Walk Independently    ___ Shower independently _X__ Walk with assistance    ___ Shower with assistance ___ No alcohol     ___ Return to work/school ________   Special Instructions:    COMMUNITY REFERRALS UPON DISCHARGE:   Outpatient: PT & OT  Agency:[facility] Phone:[phone]   Date of Last Service:03/09/2018  Appointment Date/Time:MAY 6 Monday 12:45-2:45 PM (BOTH PT & OT)  Medical Equipment/Items Ordered:WHEELCHAIR, ROLLING WALKER, 3 IN 1 & TUB BENCH  Agency/Supplier:[facility]   [phone]   GENERAL COMMUNITY RESOURCES FOR PATIENT/FAMILY: Support Groups:[facility] SUPPORT GROUP EVERY SECOND Thursday @ 3:00-4:00 PM ON THE REHAB UNIT  FROM SEPT-MAY QUESTIONS CONTACT [place] [phone]  STROKE/TIA DISCHARGE INSTRUCTIONS SMOKING Cigarette smoking nearly doubles your risk of having a stroke & is the single most alterable risk factor  If you smoke or have smoked in the last 12 months, you are advised to quit smoking for your health.  Most of the excess cardiovascular risk related to smoking disappears within a year of stopping.  Ask you doctor about anti-smoking medications  [place] Quit Line: 1-800-QUIT NOW  Free Smoking Cessation Classes [phone]  CHOLESTEROL Know your levels; limit fat & cholesterol in your diet  Lipid Panel     Component Value Date/Time   CHOL 175 02/13/2018 1123   TRIG 26 02/13/2018 1123   HDL 55  02/13/2018 1123   CHOLHDL 3.2 02/13/2018 1123   VLDL 5 02/13/2018 1123   LDLCALC 115 (H) 02/13/2018 1123      Many patients benefit from treatment even if their cholesterol is at goal.  Goal: Total Cholesterol (CHOL) less than 160  Goal:  Triglycerides (TRIG) less than 150  Goal:  HDL greater than 40  Goal:  LDL (LDLCALC) less than 100   BLOOD PRESSURE American Stroke Association blood pressure target is less that 120/80 mm/Hg  Your discharge blood pressure is:  BP: 113/70  Monitor your blood pressure  Limit your salt and alcohol intake  Many individuals will require more than one medication for high blood pressure  DIABETES (A1c is a blood sugar average for last 3 months) Goal HGBA1c is under 7% (HBGA1c is blood sugar average for last 3 months)  Diabetes: No known diagnosis of diabetes    Lab Results  Component Value Date   HGBA1C 5.3 02/13/2018     Your HGBA1c can be lowered with medications, healthy diet, and exercise.  Check your blood sugar as directed by your physician  Call your physician if you experience unexplained or low blood sugars.  PHYSICAL ACTIVITY/REHABILITATION Goal is 30 minutes at least 4 days per week  Activity: No driving, Therapies: see above Return to work: N/A  Activity decreases your risk of heart attack and stroke and makes your heart stronger.  It helps control your weight and blood pressure; helps you relax and can improve your mood.  Participate in a regular exercise program.  Talk with your doctor about the best form of exercise for you (  dancing, walking, swimming, cycling).  DIET/WEIGHT Goal is to maintain a healthy weight  Your discharge diet is:  Diet Order           Diet Heart Room service appropriate? Yes; Fluid consistency: Thin  Diet effective now          liquids Your height is:  Height:  (170.2 cm) Your current weight is: Weight: 84.2 kg (185 lb 10 oz) Your Body Mass Index (BMI) is:  BMI (Calculated): 29.07   Following the type of diet specifically designed for you will help prevent another stroke.  Your goal weight is:    Your goal Body Mass Index (BMI) is 19-24.  Healthy food habits can help reduce 3 risk factors for stroke:  High cholesterol, hypertension, and excess weight.  RESOURCES Stroke/Support Group:  Call [phone]   STROKE EDUCATION PROVIDED/REVIEWED AND GIVEN TO PATIENT Stroke warning signs and symptoms How to activate emergency medical system (call 911). Medications prescribed at discharge. Need for follow-up after discharge. Personal risk factors for stroke. Pneumonia vaccine given:  Flu vaccine given:  My questions have been answered, the writing is legible, and I understand these instructions.  I will adhere to these goals & educational materials that have been provided to me after my discharge from the hospital.     My questions have been answered and I understand these instructions. I will adhere to these goals and the provided educational materials after my discharge from the hospital.  Patient/Caregiver Signature _______________________________ Date __________  Clinician Signature _______________________________________ Date __________  Please bring this form and your medication list with you to all your follow-up doctor's appointments.

## 2018-03-09 NOTE — Telephone Encounter (Signed)
Lipitor 20 mg #30/1 refill --take daily in evenings called in to [facility] on [address]. Patient informed.

## 2018-03-09 NOTE — Progress Notes (Signed)
Pt left unit via WC with NT and Husband. Belongings in tow. Discharge instructors given by P. Love PA. All questions/concerns answered.

## 2018-03-13 ENCOUNTER — Ambulatory Visit: Payer: 59 | Admitting: Physical Therapy

## 2018-03-13 ENCOUNTER — Ambulatory Visit: Payer: 59 | Attending: Physical Medicine & Rehabilitation | Admitting: Occupational Therapy

## 2018-03-13 ENCOUNTER — Encounter: Payer: Self-pay | Admitting: Occupational Therapy

## 2018-03-13 ENCOUNTER — Telehealth: Payer: Self-pay | Admitting: Registered Nurse

## 2018-03-13 ENCOUNTER — Encounter: Payer: Self-pay | Admitting: Physical Therapy

## 2018-03-13 VITALS — BP 127/76 | HR 76

## 2018-03-13 DIAGNOSIS — R293 Abnormal posture: Secondary | ICD-10-CM

## 2018-03-13 DIAGNOSIS — I69252 Hemiplegia and hemiparesis following other nontraumatic intracranial hemorrhage affecting left dominant side: Secondary | ICD-10-CM | POA: Insufficient documentation

## 2018-03-13 DIAGNOSIS — M6281 Muscle weakness (generalized): Secondary | ICD-10-CM

## 2018-03-13 DIAGNOSIS — R29818 Other symptoms and signs involving the nervous system: Secondary | ICD-10-CM

## 2018-03-13 DIAGNOSIS — I69154 Hemiplegia and hemiparesis following nontraumatic intracerebral hemorrhage affecting left non-dominant side: Secondary | ICD-10-CM | POA: Diagnosis present

## 2018-03-13 DIAGNOSIS — R2681 Unsteadiness on feet: Secondary | ICD-10-CM

## 2018-03-13 DIAGNOSIS — R2689 Other abnormalities of gait and mobility: Secondary | ICD-10-CM | POA: Insufficient documentation

## 2018-03-13 NOTE — Telephone Encounter (Signed)
Transitional Care call Transitional Care Call Completed, Appointment Confirmed, Address Confirmed, New Packet Mailed Sent  Patient name: Jocelyn [name]  DOB: [birth year] 1. Are you/is patient experiencing any problems since coming home? No a. Are there any questions regarding any aspect of care? No 2. Are there any questions regarding medications administration/dosing? No a. Are meds being taken as prescribed? Yes b. "Patient should review meds with caller to confirm"  3. Have there been any falls? No 4. Has Home Health been to the house and/or have they contacted you? Yes, [facility], evaluation Today.  a. If not, have you tried to contact them? NA b. Can we help you contact them? No 5. Are bowels and bladder emptying properly? Yes a. Are there any unexpected incontinence issues? NA b. If applicable, is patient following bowel/bladder programs? NA 6. Any fevers, problems with breathing, unexpected pain? No 7. Are there any skin problems or new areas of breakdown? No 8. Has the patient/family member arranged specialty MD follow up (ie cardiology/neurology/renal/surgical/etc.)?  She was instructed to call Dr. Roda Shutters office she verbalizes understanding.  a. Can we help arrange? NA 9. Does the patient need any other services or support that we can help arrange? No 10. Are caregivers following through as expected in assisting the patient? No 11. Has the patient quit smoking, drinking alcohol, or using drugs as recommended? [name] denies smoking, drinking alcohol or using illicit drugs.   Appointment date/time 03/20/2018, arrival at 09:45 for 10:00 appointment, with Jacalyn Lefevre ANP-C. At [address] [facility] [address]

## 2018-03-13 NOTE — Therapy (Signed)
[facility] [address] [place], [place], [address] Phone: [phone]   Fax:  [phone]  Occupational Therapy Evaluation  Patient Details  Name: Jocelyn [name] MRN: [id number] Date of Birth: 06-02-1966 Referring Provider: Dr. Claudette Laws   Encounter Date: 03/13/2018  OT End of Session - 03/13/18 1633    Visit Number  1    Number of Visits  16    Date for OT Re-Evaluation  05/08/18    Authorization Type  [employer]  - 100 visit limit combined for PT and OT    OT Start Time  1401    OT Stop Time  1444    OT Time Calculation (min)  43 min    Activity Tolerance  Patient tolerated treatment well       Past Medical History:  Diagnosis Date  . Anemia   . Hypertension   . ICH (intracerebral hemorrhage) (HCC) 02/2018    Past Surgical History:  Procedure Laterality Date  . MYOMECTOMY      There were no vitals filed for this visit.  Subjective Assessment - 03/13/18 1409    Patient is accompained by:  Family member Husband Ronell    Pertinent History  R BG hemorrhage with edema and midline shift 02/13/2018    Patient Stated Goals  I want to be able to walk on my own without the brace or cane, I want to drive, I want my "normal" back    Currently in Pain?  No/denies pt occassional gets hip pain but does not have any today        [facility] OT Assessment - 03/13/18 1412      Assessment   Medical Diagnosis  R BG CVA    Referring Provider  Dr. Claudette Laws    Onset Date/Surgical Date  02/13/18    Hand Dominance  Right    Prior Therapy  inpt rehab for 3 weeks PT, OT and ST (only 1 visit). D/c home on 03/09/2018      Precautions   Precautions  Fall      Restrictions   Weight Bearing Restrictions  No      Balance Screen   Has the patient fallen in the past 6 months  No      Home  Environment   Family/patient expects to be discharged to:  Private residence    Living Arrangements  Spouse/significant other     Available Help at Discharge  Available PRN/intermittently husband returns to work in 2 days, nephew to help out    Type of Home  Aartment    Home Access  Level entry    Bathroom Shower/Tub  Tub/Shower unit    [employer]  Standard    Additional Comments  transfer tub bench, 3 in 1 commode over toilet, hand held shower head, walker and wheelchair      Prior Function   Level of Independence  Independent    Vocation  Full time employment    Administrator, sports, 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    Leisure  Scientist, physiological, shop, go out to dinner      ADL   Eating/Feeding  Minimal assistance to cut food on plate    Grooming  Independent    Upper Body Bathing  Minimal assistance R shoulder    Lower Body Bathing  Supervision/safety    Upper Body Dressing  Minimal assistance buttoning, wears sports bra    Lower Body Dressing  Minimal assistance to hike pants on Left and tie shoes    Toilet Transfer  Minimal assistance    Toileting - Clothing Manipulation  Minimal assistance    Toileting -  Hygiene  Modified Independent    Tub/Shower Transfer  Minimal assistance      IADL   Shopping  Assistance for transportation goes with husband does together    Light Housekeeping  Performs light daily tasks but cannot maintain acceptable level of cleanliness    Meal Prep  Needs to have meals prepared and served pt used to [employer] Sunday dinners    Community Mobility  Relies on family or friends for transportation    Medication Management  Takes responsibility if medication is prepared in advance in seperate dosage pt organizes needs assistance open pill bottles    Physicist, medical financial matters independently (budgets, writes checks, pays rent, bills goes to bank), collects and keeps track of income      Mobility   Mobility Status  Needs assist    Mobility Status Comments  uses walker with assistance in the home.      Written Expression   Dominant Hand  Right       Vision - History   Baseline Vision  Wears glasses only for reading    Additional Comments  Pt denies any visual changes      Activity Tolerance   Activity Tolerance  Tolerate 30+ min activity without fatigue      Cognition   Overall Cognitive Status  Within Functional Limits for tasks assessed      Posture/Postural Control   Posture/Postural Control  Postural limitations    Posture Comments  L scapula abducted, elevated and upwardly rotated. Will assess further.       Sensation   Light Touch  Appears Intact    Hot/Cold  Appears Intact    Proprioception  Impaired by gross assessment    Additional Comments  Pt reports she doesn't always know where her L foot is if she isn't looking at it.       Coordination   Gross Motor Movements are Fluid and Coordinated  No    Fine Motor Movements are Fluid and Coordinated  No    Other  unable to do standardized tests with LUE at this time.      Tone   Assessment Location  Left Upper Extremity      ROM / Strength   AROM / PROM / Strength  PROM;Strength;AROM      AROM   Overall AROM   Deficits    Overall AROM Comments  LUE WFL's, RUE:  Pt with Brunstromm II-III, hand with isolated finger flexion/extension.  Shoulder flexion to 65* and abduction to 90* with signficant compensations.  Pt will full elbow flexion/extension in gravity eliminated as well as half range for wrist extension, full wrist flexion, 3/4 range for finger flexion and extension.  Supination and pronation 3/4 range with arm supported (gravity eliminated).  Pt has no pain in L shoulder      PROM   Overall PROM   Within functional limits for tasks performed    Overall PROM Comments  BUE's      Strength   Overall Strength  Deficits    Overall Strength Comments  RUE WFL's, LUE with signficant flexor tone therefore unable to do MMT.  No greater than 2/5.       Hand Function   Right Hand Gross Grasp  Functional    Right  Hand Grip (lbs)  58    Left Hand Gross Grasp  Impaired     Left Hand Grip (lbs)  0 pt has finger flexion but not composite grasp      LUE Tone   LUE Tone  Moderate;Modified Ashworth      LUE Tone   Modified Ashworth Scale for Grading Hypertonia LUE  More marked increase in muscle tone through most of the ROM, but affected part(s) easily moved                        OT Short Term Goals - 03/13/18 1510      OT SHORT TERM GOAL #1   Title  Pt and husband will be mod I with HEP for LUE for AROM/strength, and positioning. - 04/10/2018    Status  New      OT SHORT TERM GOAL #2   Title  Pt will be supervision for bathing    Status  New      OT SHORT TERM GOAL #3   Title  Pt will be mod I with dressing, AE prn    Status  New      OT SHORT TERM GOAL #4   Title  Pt will be mod I with toilet transfers    Status  New      OT SHORT TERM GOAL #5   Title  Pt will be mod I with splint wear and care, prn    Status  New      OT SHORT TERM GOAL #6   Title  Pt will demonstrate the ability for bilateral low reach to pick up a light weight object    Status  New      OT SHORT TERM GOAL #7   Title  Pt will demonstrate the ability for grasp and release for light small object    Status  New        OT Long Term Goals - 03/13/18 1520      OT LONG TERM GOAL #1   Title  Pt will be mod I for LUE HEP for AROM/strength/functional use - 05/08/2018    Status  New      OT LONG TERM GOAL #2   Title  Pt will be mod I with bathing    Status  New      OT LONG TERM GOAL #3   Title  Pt will be mod I with shower transfers    Status  New      OT LONG TERM GOAL #4   Title  Pt will be min a for simple hot meal prep    Status  New      OT LONG TERM GOAL #5   Title  Pt will be mod  I with loading clothes washer and dryer and folding clothes    Status  New      OT LONG TERM GOAL #6   Title  Pt will verbalize understanding of driving evaluation information    Status  New      OT LONG TERM GOAL #7   Title  Pt will demonstrate ability to use  LUE for mid reach into kitchen cabinet to obtain ingredients when cooking    Status  New            Plan - 03/13/18 1523    Clinical Impression Statement  Pt is a 52 year old female s/p R BG hemorraghic CVA with edema and  mildine shift on 02/13/2018.  Pt went to inpt rehab and was then d/c home with her husband on 03/09/2018.  Pt presents today with the following deficits that impact her independence in ADL, IADL, leisure and work:  L spastic non dominant hemiplegia, abnormal posture, decreased balance, decreased strength, decreased functional use of LUE, impaired sensation (proprioception only).  Pt will benefit from skilled OT to address these deficits and maximize independent functioning.     Occupational Profile and client history currently impacting functional performance  HTN, HLD, anemia, chronic bursitis, +tobacco use,     Occupational performance deficits (Please refer to evaluation for details):  ADL's;IADL's;Work;Leisure;Social Participation    Rehab Potential  Good    OT Frequency  2x / week    OT Duration  8 weeks    OT Treatment/Interventions  Self-care/ADL training;Aquatic Therapy;Cryotherapy;Moist Heat;Electrical Stimulation;DME and/or AE instruction;Neuromuscular education;Therapeutic exercise;Functional Mobility Training;Manual Therapy;Passive range of motion;Splinting;Therapeutic activities;Balance training;Patient/family education    Plan  initiate HEP if possible, NMR for LUE/trunk, balance, transitional movements, functional mobility    Consulted and Agree with Plan of Care  Patient;Family member/caregiver    Family Member Consulted  husband       Patient will benefit from skilled therapeutic intervention in order to improve the following deficits and impairments:  Abnormal gait, Decreased balance, Decreased coordination, Decreased mobility, Decreased range of motion, Difficulty walking, Decreased strength, Impaired UE functional use, Impaired tone, Impaired  sensation  Visit Diagnosis: 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 Slow transit constipation   . Greater trochanteric bursitis of left hip   . Acute blood loss anemia   . Flaccid monoplegia of upper extremity (HCC)   . Hyperlipemia 02/16/2018  . Cerebral edema (HCC) 02/16/2018  . Benign essential HTN   . Anemia of chronic disease   . Amphetamine abuse (HCC)   . Hypokalemia   . 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 Pneumonia, community acquired 09/23/2015  . Microcytic hypochromic anemia 09/23/2015  . Tobacco abuse 09/23/2015  . Pneumonia 09/23/2015  . Nicotine addiction 12/09/2013  . Microcytic anemia 12/09/2013  . HTN (hypertension) 12/09/2013  . BMI 36.0-36.9,adult 12/09/2013    Norton Pastel , OTR/L 03/13/2018, 4:37 PM  [facility] [address] [place], [place], [address] Phone: [phone]   Fax:  [phone]  Name: Jocelyn [name] MRN: [id number] Date of Birth: 1966/10/17

## 2018-03-14 ENCOUNTER — Encounter: Payer: Self-pay | Admitting: Occupational Therapy

## 2018-03-14 ENCOUNTER — Encounter: Payer: Self-pay | Admitting: Rehabilitation

## 2018-03-14 ENCOUNTER — Ambulatory Visit: Payer: 59 | Admitting: Rehabilitation

## 2018-03-14 ENCOUNTER — Ambulatory Visit: Payer: 59 | Admitting: Occupational Therapy

## 2018-03-14 DIAGNOSIS — M6281 Muscle weakness (generalized): Secondary | ICD-10-CM

## 2018-03-14 DIAGNOSIS — I69252 Hemiplegia and hemiparesis following other nontraumatic intracranial hemorrhage affecting left dominant side: Secondary | ICD-10-CM | POA: Diagnosis not present

## 2018-03-14 DIAGNOSIS — I69154 Hemiplegia and hemiparesis following nontraumatic intracerebral hemorrhage affecting left non-dominant side: Secondary | ICD-10-CM

## 2018-03-14 DIAGNOSIS — R2681 Unsteadiness on feet: Secondary | ICD-10-CM

## 2018-03-14 DIAGNOSIS — R29818 Other symptoms and signs involving the nervous system: Secondary | ICD-10-CM

## 2018-03-14 DIAGNOSIS — R2689 Other abnormalities of gait and mobility: Secondary | ICD-10-CM

## 2018-03-14 DIAGNOSIS — R293 Abnormal posture: Secondary | ICD-10-CM

## 2018-03-14 NOTE — Therapy (Addendum)
[facility] [address] [place], [place], [address] Phone: [phone]   Fax:  [phone]  Physical Therapy Treatment  Patient Details  Name: Jocelyn [name] MRN: [id number] Date of Birth: 1966-10-30 Referring Provider: Dr. Claudette Laws   Encounter Date: 03/14/2018  PT End of Session - 03/14/18 1334    Visit Number  2    Number of Visits  17    Date for PT Re-Evaluation  05/13/18    Authorization Type  [employer] - VL: 100 combined PT, OT, ST    Authorization - Visit Number  2 PT to track PT visits, OT to track OT visits    Authorization - Number of Visits  50 To ensure we do not go over on visits, PT split    PT Start Time  1101    PT Stop Time  1145    PT Time Calculation (min)  44 min    Activity Tolerance  Patient tolerated treatment well    Behavior During Therapy  [facility] for tasks assessed/performed       Past Medical History:  Diagnosis Date  . Anemia   . Hypertension   . ICH (intracerebral hemorrhage) (HCC) 02/2018    Past Surgical History:  Procedure Laterality Date  . MYOMECTOMY      There were no vitals filed for this visit.  Subjective Assessment - 03/14/18 1105    Subjective  Pt reports no falls since yesterday.  Things going well.     Patient is accompained by:  Family member Ronnell    Pertinent History  HTN, tobacco use, anemia, bursitis of L hip    Limitations  Walking    How long can you stand comfortably?  short duration    How long can you walk comfortably?  household distances    Patient Stated Goals  to be able to walk without any type of AD or AFO and to drive again    Currently in Pain?  No/denies         [facility] PT Assessment - 03/13/18 1335      Assessment   Medical Diagnosis  R basal ganglia CVA    Referring Provider  Dr. Victorino Sparrow. Kirsteins    Onset Date/Surgical Date  02/13/18    Hand Dominance  Right    Prior Therapy  CIR x 3 weeks      Precautions   Precautions   Fall;Other (comment)    Precaution Comments  HTN, greater trochanteric bursitis of left hip, tobacco use, anemia      Restrictions   Weight Bearing Restrictions  No      Balance Screen   Has the patient fallen in the past 6 months  No    Has the patient had a decrease in activity level because of a fear of falling?   Yes    Is the patient reluctant to leave their home because of a fear of falling?   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      Observation/Other Assessments   Focus on Therapeutic Outcomes (FOTO)   40%, 60% limitation (predicted 41% limitation at D/C)      Sensation   Light Touch  Appears Intact    Proprioception  Impaired by gross assessment    Additional Comments  reports no sensation issues in LLE but feels she needs to look at her foot to know what it is doing while ambulating      Posture/Postural Control   Posture/Postural Control  Postural limitations    Postural Limitations  Weight shift right;Flexed trunk      ROM / Strength   AROM / PROM / Strength  Strength      Strength   Overall Strength  Deficits    Overall Strength Comments  4+/5 RLE; LLE: 3+/5 except ankle DF 1/5      Transfers   Transfers  Sit to Stand    Sit to Stand  5: Supervision    Sit to Stand Details (indicate cue type and reason)  keeps R foot posterior, L foot anterior and demonstrates minimal weight shift and extension through LLE during sit <> stand      Ambulation/Gait   Ambulation/Gait  Yes     Ambulation/Gait Assistance  4: Min assist    Ambulation/Gait Assistance Details  use of RW with hand orthosis during gait velocity testing; then performed same distance without RW with light min A through LUE    Ambulation Distance (Feet)  50 Feet    Assistive device  Rolling walker;1 person hand held assist    Gait Pattern  Step-through pattern;Lateral trunk lean to right;Left flexed knee in stance;Decreased hip/knee flexion - left;Decreased dorsiflexion - left;Decreased step length - left    Ambulation Surface  Level;Indoor      Standardized Balance Assessment   Standardized Balance Assessment  Five Times Sit to Stand;10 meter walk test    Five times sit to stand comments   26.50 seconds, using RUE to push to stand, uncontrolled descent    10 Meter Walk  39.97 seconds with RW with hand orthosis or .82 ft/sec                   [facility] Adult PT Treatment/Exercise - 03/14/18 1106      Ambulation/Gait   Ambulation/Gait  Yes    Ambulation/Gait Assistance  5: Supervision;4: Min guard;4: Min assist    Ambulation/Gait Assistance Details  Pt ambulatory with use of RW (with hand orthosis) and L AFO at mod I level to S level (min cues coming back to treatment area for posture).  Donned small heel wedge under L AFO to determine if this would decrease knee recurvatum.  It made a small change, however also assessed with gait with RW and she has even more recurvatum without support of RW.  However PT was able to correct trunk and postural compensations and L knee recurvatum decreased with improved LLE loading during gait.  Will continue to address in future sessions.   With gait without AD (with AFO) provided tactile facilitation at trunk for posture and decreased L rotation, and also at L hip for improved L hip protraction during stance.  Again when this was corrected, note decreased recurvatum at knee.      Ambulation Distance (Feet)  100 Feet then another 30' x 2 without RW    Assistive device  Rolling walker    Gait Pattern  Step-through pattern;Lateral trunk lean to right;Left flexed knee in stance;Decreased hip/knee flexion - left;Decreased dorsiflexion - left;Decreased step length - left    Ambulation Surface  Level;Indoor      Standardized Balance Assessment   Standardized Balance Assessment  Berg Balance Test      Berg Balance Test   Sit to Stand  Able to stand without using hands and stabilize independently    Standing Unsupported  Able to stand safely 2 minutes    Sitting with Back Unsupported but Feet Supported on Floor or Stool  Able to sit safely and securely 2 minutes    Stand to Sit  Sits safely with minimal use of hands    Transfers  Able to transfer safely, minor use of hands    Standing Unsupported with Eyes Closed  Able to stand 10 seconds safely    Standing Ubsupported with Feet Together  Able to place feet together independently and stand 1 minute safely    From Standing, Reach Forward with Outstretched Arm  Can reach forward >12 cm safely (5")    From Standing Position, Pick up Object from Floor  Able to pick up shoe, needs supervision    From Standing Position, Turn to Look Behind Over each Shoulder  Looks behind one side only/other side shows less weight shift    Turn 360 Degrees  Able to turn 360 degrees safely but slowly    Standing Unsupported, Alternately Place Feet on Step/Stool  Able to complete 4 steps without aid or supervision close S    Standing Unsupported, One Foot in Front  Able to plae foot ahead of the other independently and hold 30 seconds    Standing on One Leg  Tries to lift leg/unable to hold 3 seconds but remains standing independently on Left leg    Total Score  45      Neuro Re-ed    Neuro Re-ed Details   Note that during sit<>stand she tends to keep weight shifted R.  Performed blocked practice during session x 10 reps with BUE support on L leg initially, however note that this increased trunk rotation towards L and decreased L lateral  weight shift therefore had her perform without UE support in which she was able to do.  Recommended she do from sturdy chair in front of mirror (she reports she does have standing mirror) in order to better visualize midline posture.  Also performed standing hip abd x 10 reps on R side to improve L proximal hip stabilization and L knee control.  Provided cues to pt and husband on how to correct technique.  Also performed standing squats with emphasis on equal WB at counter with UE support.  Performed x 10 reps.  Added these three to HEP.               PT Education - 03/14/18 1333    Education provided  Yes    Education Details  BERG balance results and HEP    Person(s) Educated  Patient;Spouse    Methods  Explanation;Demonstration    Comprehension  Verbalized understanding;Returned demonstration       PT Short Term Goals - 03/14/18 1357      PT SHORT TERM GOAL #1   Title  Pt will participate in further balance assessment with BERG balance assessment    Baseline  45/56 on 03/14/18    Time  4    Period  Weeks  Status  Achieved      PT SHORT TERM GOAL #2   Title  Pt will participate in initial set up of functional electrical stimulation for LLE    Time  4    Period  Weeks    Status  New      PT SHORT TERM GOAL #3   Title  Pt will improve LE strength and WB/activation of LLE with five time sit to stand decrease to <20 seconds (more equal WB through R and LLE during sit <> stand)    Baseline  25 sec    Time  4    Period  Weeks    Status  New      PT SHORT TERM GOAL #4   Title  Pt will improve gait velocity with RW and AFO to >1.0 ft/sec    Baseline  .8 ft/sec    Time  4    Period  Weeks    Status  New      PT SHORT TERM GOAL #5   Title  Pt will initiate gait training without RW (with cane or no AD) on indoor surfaces with min A    Time  4    Period  Weeks    Status  New        PT Long Term Goals - 03/14/18 1610      PT LONG TERM GOAL #1   Title  Pt will perform  HEP with supervision    Time  8    Period  Weeks    Status  New    Target Date  05/13/18      PT LONG TERM GOAL #2   Title  Pt will improve overall function to >/= 60% on FOTO    Baseline  40%    Time  8    Period  Weeks    Status  New    Target Date  05/13/18      PT LONG TERM GOAL #3   Title  Pt will improve balance, decrease falls risk as indicated by increase in BERG score by 8 points    Baseline  TBD    Time  8    Period  Weeks    Status  New    Target Date  05/13/18      PT LONG TERM GOAL #4   Title  Pt will improve gait velocity to >/= 1.8 ft/sec with most appropriate AFO and LRAD    Time  8    Period  Weeks    Status  New    Target Date  05/13/18      PT LONG TERM GOAL #5   Title  Pt will improve five time sit to stand to </= 16 seconds with equal WB through R/LLE    Time  8    Period  Weeks    Status  New    Target Date  05/13/18      Additional Long Term Goals   Additional Long Term Goals  Yes      PT LONG TERM GOAL #6   Title  Pt will ambulate x 230' indoors and 250' outside over curbs and uneven pavement with LRAD/AFO and supervision    Time  8    Period  Weeks    Status  New    Target Date  05/13/18            Plan - 03/14/18 1336    Clinical Impression Statement  Session focused on formal assessment of balance with BERG.  Note score of 45/56 indicative of significant fall risk.  Based on deficits noted with BERG, provided pt with initial HEP for NMR in LLE, see pt instruction.  Also attempted to utilize heel wedge under her L AFO to better control knee recurvatum.   This only slightly helped with use of RW, however if improved postural alignment/control, this seemed to decrease recurvatum.      Rehab Potential  Good    PT Frequency  2x / week    PT Duration  8 weeks    PT Treatment/Interventions  ADLs/Self Care Home Management;Electrical Stimulation;DME Instruction;Gait training;Stair training;Functional mobility training;Therapeutic  activities;Therapeutic exercise;Balance training;Neuromuscular re-education;Patient/family education;Orthotic Fit/Training;Passive range of motion;Taping    PT Next Visit Plan   Discuss use of Bioness - it pt agreeable, set up bioness L DF and hamstring.  NMR for trunk and LLE.  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 Slow transit constipation   . Greater trochanteric bursitis of left hip   . Acute blood loss anemia   . Flaccid monoplegia of upper extremity (HCC)   . Hyperlipemia 02/16/2018  . Cerebral edema (HCC) 02/16/2018  . Benign essential HTN   . Anemia of chronic disease   . Amphetamine abuse (HCC)   . Hypokalemia   . 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 Pneumonia, community acquired 09/23/2015  . Microcytic hypochromic anemia 09/23/2015  . Tobacco abuse 09/23/2015  . Pneumonia 09/23/2015  . Nicotine addiction 12/09/2013  . Microcytic anemia 12/09/2013  . HTN (hypertension) 12/09/2013  . BMI 36.0-36.9,adult 12/09/2013   Harriet Butte, PT, MPT [facility] [address] [place], [place], [address] Phone: [phone]   Fax:  [phone] 03/14/18, 1:57 PM  Name: Jocelyn [name] MRN:  [id number] Date of Birth: 1966-11-01

## 2018-03-14 NOTE — Therapy (Signed)
[facility] [address] [place], [place], [address] Phone: [phone]   Fax:  [phone]  Occupational Therapy Treatment  Patient Details  Name: Jocelyn [name] MRN: [id number] Date of Birth: 09-04-1966 Referring Provider: Dr. Claudette Laws   Encounter Date: 03/14/2018  OT End of Session - 03/14/18 1313    Visit Number  2    Number of Visits  16    Date for OT Re-Evaluation  05/08/18    Authorization Type  [employer]  - 100 visit limit combined for PT and OT    OT Start Time  0931    OT Stop Time  1017    OT Time Calculation (min)  46 min    Activity Tolerance  Patient tolerated treatment well       Past Medical History:  Diagnosis Date  . Anemia   . Hypertension   . ICH (intracerebral hemorrhage) (HCC) 02/2018    Past Surgical History:  Procedure Laterality Date  . MYOMECTOMY      There were no vitals filed for this visit.  Subjective Assessment - 03/14/18 0935    Subjective   I am fine with those goals.    Patient is accompained by:  Family member husband    Pertinent History  R BG hemorrhage with edema and midline shift 02/13/2018    Patient Stated Goals  I want to be able to walk on my own without the brace or cane, I want to drive, I want my "normal" back    Currently in Pain?  Yes                 OT Treatments/Exercises (OP) - 03/14/18 0001      ADLs   Overall ADLs  Demonstrated postioning for LUE for supine and R sidelying for night time. Pt and husband both verbalized understanding.      ADL Comments  Reviewed goals and OT POC - pt and husband in agreement and written copy provided for pt.       Neurological Re-education Exercises   Other Exercises 2  Neuro re ed to address inititation of HEP for LUE - see pt instructions for details. Pt able to return demonstrate and husband to cue as needed. Pt with very poor ability to truly shift to midline or to the left even in sitting. Pt able  to verbalize that it felt "scary" to be shifted to midline and left in sitting.  Also addressed functional ambulation with walker - cued pt to use UE's only to steady and to attend to not hiking L shoulder - pt able to begin to self monitor this.  Pt with signficant trunk deviations in sitting and standing.       Manual Therapy   Manual Therapy  Joint mobilization;Soft tissue mobilization;Scapular mobilization    Manual therapy comments  joint, soft tissue and scapular mob to address malalignment in L shoulder girdle, Pt's L scapula elevated, abducted and upwardly rotated.  Addressed in sidelying with incorporation of active movement as well as supine with improved alignment.              OT Education - 03/14/18 1311    Education provided  Yes    Education Details  initiated HEP for LUE, bed positioning,     Person(s) Educated  Patient;Spouse    Methods  Explanation;Demonstration;Handout    Comprehension  Verbalized understanding;Returned demonstration       OT Short Term Goals - 03/14/18 1311  OT SHORT TERM GOAL #1   Title  Pt and husband will be mod I with HEP for LUE for AROM/strength, and positioning. - 04/10/2018    Status  On-going      OT SHORT TERM GOAL #2   Title  Pt will be supervision for bathing    Status  On-going      OT SHORT TERM GOAL #3   Title  Pt will be mod I with dressing, AE prn    Status  On-going      OT SHORT TERM GOAL #4   Title  Pt will be mod I with toilet transfers    Status  On-going      OT SHORT TERM GOAL #5   Title  Pt will be mod I with splint wear and care, prn    Status  On-going      OT SHORT TERM GOAL #6   Title  Pt will demonstrate the ability for bilateral low reach to pick up a light weight object    Status  On-going      OT SHORT TERM GOAL #7   Title  Pt will demonstrate the ability for grasp and release for light small object    Status  On-going        OT Long Term Goals - 03/14/18 1312      OT LONG TERM GOAL #1    Title  Pt will be mod I for LUE HEP for AROM/strength/functional use - 05/08/2018    Status  On-going      OT LONG TERM GOAL #2   Title  Pt will be mod I with bathing    Status  On-going      OT LONG TERM GOAL #3   Title  Pt will be mod I with shower transfers    Status  On-going      OT LONG TERM GOAL #4   Title  Pt will be min a for simple hot meal prep    Status  On-going      OT LONG TERM GOAL #5   Title  Pt will be mod  I with loading clothes washer and dryer and folding clothes    Status  On-going      OT LONG TERM GOAL #6   Title  Pt will verbalize understanding of driving evaluation information    Status  On-going      OT LONG TERM GOAL #7   Title  Pt will demonstrate ability to use LUE for mid reach into kitchen cabinet to obtain ingredients when cooking    Status  On-going            Plan - 03/14/18 1312    Clinical Impression Statement  Pt making progress toward goals. Pt in agreeement with goals and POC    Occupational Profile and client history currently impacting functional performance  HTN, HLD, anemia, chronic bursitis, +tobacco use,     Occupational performance deficits (Please refer to evaluation for details):  ADL's;IADL's;Work;Leisure;Social Participation    Rehab Potential  Good    OT Frequency  2x / week    OT Duration  8 weeks    OT Treatment/Interventions  Self-care/ADL training;Aquatic Therapy;Cryotherapy;Moist Heat;Electrical Stimulation;DME and/or AE instruction;Neuromuscular education;Therapeutic exercise;Functional Mobility Training;Manual Therapy;Passive range of motion;Splinting;Therapeutic activities;Balance training;Patient/family education    Plan  check and add to HEP if possible, NMR for LUE/trunk, balance, transitional movements, functional mobility    Consulted and Agree with Plan of Care  Patient  Family Member Consulted  husband       Patient will benefit from skilled therapeutic intervention in order to improve the following  deficits and impairments:  Abnormal gait, Decreased balance, Decreased coordination, Decreased mobility, Decreased range of motion, Difficulty walking, Decreased strength, Impaired UE functional use, Impaired tone, Impaired sensation  Visit Diagnosis: Muscle weakness (generalized)  Abnormal posture  Other symptoms and signs involving the nervous system  Unsteadiness on feet  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 Slow transit constipation   . Greater trochanteric bursitis of left hip   . Acute blood loss anemia   . Flaccid monoplegia of upper extremity (HCC)   . Hyperlipemia 02/16/2018  . Cerebral edema (HCC) 02/16/2018  . Benign essential HTN   . Anemia of chronic disease   . Amphetamine abuse (HCC)   . Hypokalemia   . 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 Pneumonia, community acquired 09/23/2015  . Microcytic hypochromic anemia 09/23/2015  . Tobacco abuse 09/23/2015  . Pneumonia 09/23/2015  . Nicotine addiction 12/09/2013  . Microcytic anemia 12/09/2013  . HTN (hypertension) 12/09/2013  . BMI 36.0-36.9,adult 12/09/2013    Norton Pastel, OTR/L 03/14/2018, 1:15 PM  [facility] [address] [place], [place], [address] Phone: [phone]   Fax:  [phone]  Name: Jocelyn [name] MRN: [id number] Date of Birth: Nov 07, 1966

## 2018-03-14 NOTE — Therapy (Signed)
[facility] [address] [place], [place], [address] Phone: [phone]   Fax:  [phone]  Physical Therapy Evaluation  Patient Details  Name: Jocelyn [name] MRN: [id number] Date of Birth: Dec 30, 1965 Referring Provider: Dr. Claudette Laws   Encounter Date: 03/13/2018  PT End of Session - 03/14/18 0929    Visit Number  1    Number of Visits  17    Date for PT Re-Evaluation  05/13/18    Authorization Type  [employer] - VL: 100 combined PT, OT, ST    Authorization - Visit Number  2    Authorization - Number of Visits  100    PT Start Time  1315    PT Stop Time  1406    PT Time Calculation (min)  51 min    Activity Tolerance  Patient tolerated treatment well    Behavior During Therapy  [facility] for tasks assessed/performed       Past Medical History:  Diagnosis Date  . Anemia   . Hypertension   . ICH (intracerebral hemorrhage) (HCC) 02/2018    Past Surgical History:  Procedure Laterality Date  . MYOMECTOMY      Vitals:   03/14/18 0921  BP: 127/76  Pulse: 76     Subjective Assessment - 03/13/18 1332    Subjective  Since D/C home from IP rehab pt is able to transfer from bed <> w/c <> RW with supervision.  Still needs assistance with showering.  Pt ambulatory in the home short distances with RW and AFO; uses w/c for longer distances and out in the community.      Patient is accompained by:  Family member    Pertinent History  HTN, tobacco use, anemia, bursitis of L hip    Limitations  Walking    How long can you stand comfortably?  short duration    How long can you walk comfortably?  household distances    Patient Stated Goals  to be able to walk without any type of AD or AFO and to drive again    Currently in Pain?  No/denies but has reported L hip pain         [facility] PT Assessment - 03/13/18 1335      Assessment   Medical Diagnosis  R basal ganglia CVA    Referring Provider  Dr. Victorino Sparrow. Kirsteins     Onset Date/Surgical Date  02/13/18    Hand Dominance  Right    Prior Therapy  CIR x 3 weeks      Precautions   Precautions  Fall;Other (comment)    Precaution Comments  HTN, greater trochanteric bursitis of left hip, tobacco use, anemia      Restrictions   Weight Bearing Restrictions  No      Balance Screen   Has the patient fallen in the past 6 months  No    Has the patient had a decrease in activity level because of a fear of falling?   Yes    Is the patient reluctant to leave their home because of a fear of falling?   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      Observation/Other Assessments   Focus on Therapeutic Outcomes (FOTO)   40%, 60% limitation (predicted 41% limitation at D/C)      Sensation   Light Touch  Appears Intact    Proprioception  Impaired by gross assessment    Additional Comments  reports no sensation issues in LLE but feels she needs to look at her foot to know what it is doing while ambulating      Posture/Postural Control   Posture/Postural Control  Postural limitations    Postural Limitations  Weight shift right;Flexed trunk      ROM / Strength   AROM / PROM / Strength  Strength      Strength   Overall Strength  Deficits    Overall Strength Comments  4+/5 RLE; LLE: 3+/5 except ankle DF 1/5      Transfers   Transfers  Sit to Stand    Sit to Stand  5: Supervision    Sit to Stand Details (indicate cue type and reason)  keeps R foot posterior, L foot anterior and  demonstrates minimal weight shift and extension through LLE during sit <> stand      Ambulation/Gait   Ambulation/Gait  Yes    Ambulation/Gait Assistance  4: Min assist    Ambulation/Gait Assistance Details  use of RW with hand orthosis during gait velocity testing; then performed same distance without RW with light min A through LUE    Ambulation Distance (Feet)  50 Feet    Assistive device  Rolling walker;1 person hand held assist    Gait Pattern  Step-through pattern;Lateral trunk lean to right;Left flexed knee in stance;Decreased hip/knee flexion - left;Decreased dorsiflexion - left;Decreased step length - left    Ambulation Surface  Level;Indoor      Standardized Balance Assessment   Standardized Balance Assessment  Five Times Sit to Stand;10 meter walk test    Five times sit to stand comments   26.50 seconds, using RUE to push to stand, uncontrolled descent    10 Meter Walk  39.97 seconds with RW with hand orthosis or .82 ft/sec                Objective measurements completed on examination: See above findings.              PT Education - 03/14/18 [phone]    Education provided  Yes    Education Details  PT clinical findings, PT POC and goals, purpose of heel wedge, rationale for not using a knee brace to prevent hyperextension right now    Person(s) Educated  Patient;Spouse    Methods  Explanation    Comprehension  Verbalized understanding       PT Short Term Goals - 03/14/18 0938      PT SHORT TERM GOAL #1   Title  Pt will participate in further balance assessment with BERG balance assessment    Baseline  TBD    Time  4    Period  Weeks    Status  New    Target Date  04/13/18      PT SHORT TERM GOAL #2   Title  Pt will participate in initial set up of functional  electrical stimulation for LLE    Time  4    Period  Weeks    Status  New    Target Date  04/13/18      PT SHORT TERM GOAL #3   Title  Pt will improve LE strength and WB/activation of LLE  with five time sit to stand decrease to <20 seconds (more equal WB through R and LLE during sit <> stand)    Baseline  25 sec    Time  4    Period  Weeks    Status  New    Target Date  04/13/18      PT SHORT TERM GOAL #4   Title  Pt will improve gait velocity with RW and AFO to >1.0 ft/sec    Baseline  .8 ft/sec    Time  4    Period  Weeks    Status  New    Target Date  04/13/18      PT SHORT TERM GOAL #5   Title  Pt will initiate gait training without RW (with cane or no AD) on indoor surfaces with min A    Time  4    Period  Weeks    Status  New    Target Date  04/13/18        PT Long Term Goals - 03/14/18 0942      PT LONG TERM GOAL #1   Title  Pt will perform HEP with supervision    Time  8    Period  Weeks    Status  New    Target Date  05/13/18      PT LONG TERM GOAL #2   Title  Pt will improve overall function to >/= 60% on FOTO    Baseline  40%    Time  8    Period  Weeks    Status  New    Target Date  05/13/18      PT LONG TERM GOAL #3   Title  Pt will improve balance, decrease falls risk as indicated by increase in BERG score by 8 points    Baseline  TBD    Time  8    Period  Weeks    Status  New    Target Date  05/13/18      PT LONG TERM GOAL #4   Title  Pt will improve gait velocity to >/= 1.8 ft/sec with most appropriate AFO and LRAD    Time  8    Period  Weeks    Status  New    Target Date  05/13/18      PT LONG TERM GOAL #5   Title  Pt will improve five time sit to stand to </= 16 seconds with equal WB through R/LLE    Time  8    Period  Weeks    Status  New    Target Date  05/13/18      Additional Long Term Goals   Additional Long Term Goals  Yes      PT LONG TERM GOAL #6   Title  Pt will ambulate x 230' indoors and 250' outside over curbs and uneven pavement with LRAD/AFO and supervision    Time  8    Period  Weeks    Status  New    Target Date  05/13/18             Plan - 03/14/18 0930  Clinical Impression  Statement  Pt is a 52 year old female referred to Neuro OPPT for evaluation s/p R basal ganglia CVA with L hemiplegia.  Pt's PMH is significant for the following: HTN, tobacco use, anemia and L hip bursitis. The following deficits were noted during pt's exam: L hemiparesis with impaired L proprioception, impaired strength, balance, gait and intermittent L hip pain due to bursitis.  Pt's gait velocity and five time sit to stand score indicate pt is at risk for falls. Pt would benefit from skilled PT to address these impairments and functional limitations to maximize functional mobility independence and reduce falls risk.    History and Personal Factors relevant to plan of care:  independent prior to stroke and was working full time, pt is at high falls risk currently and husband works nights.  HTN, tobacco use, anemia, L hip bursitis    Clinical Presentation  Evolving    Clinical Presentation due to:  independent prior to stroke and was working full time, pt is at high falls risk currently and husband works nights.  HTN, tobacco use, anemia, L hip bursitis    Clinical Decision Making  Moderate    Rehab Potential  Good    PT Frequency  2x / week    PT Duration  8 weeks    PT Treatment/Interventions  ADLs/Self Care Home Management;Electrical Stimulation;DME Instruction;Gait training;Stair training;Functional mobility training;Therapeutic activities;Therapeutic exercise;Balance training;Neuromuscular re-education;Patient/family education;Orthotic Fit/Training;Passive range of motion;Taping    PT Next Visit Plan  assess BERG, try heel wedge under L AFO for recurvatum.  Discuss use of Bioness - it pt agreeable, set up bioness L DF and hamstring.  NMR for trunk and LLE.  Sit <> Stand with more equal WB through R/LLE    Consulted and Agree with Plan of Care  Patient;Family member/caregiver    Family Member Consulted  husband       Patient will benefit from skilled therapeutic intervention in order to improve  the following deficits and impairments:  Abnormal gait, Decreased balance, Decreased strength, Difficulty walking, Postural dysfunction, Impaired sensation, Pain  Visit Diagnosis: Hemiplegia and hemiparesis following nontraumatic intracerebral hemorrhage affecting left non-dominant side (HCC)  Muscle weakness (generalized)  Abnormal posture  Other symptoms and signs involving the nervous system  Other abnormalities of gait and mobility  Unsteadiness on feet     Problem List Patient Active Problem List   Diagnosis Date Noted  . Slow transit constipation   . Greater trochanteric bursitis of left hip   . Acute blood loss anemia   . Flaccid monoplegia of upper extremity (HCC)   . Hyperlipemia 02/16/2018  . Cerebral edema (HCC) 02/16/2018  . Benign essential HTN   . Anemia of chronic disease   . Amphetamine abuse (HCC)   . Hypokalemia   . 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 Pneumonia, community acquired 09/23/2015  . Microcytic hypochromic anemia 09/23/2015  . Tobacco abuse 09/23/2015  . Pneumonia 09/23/2015  . Nicotine addiction 12/09/2013  . Microcytic anemia 12/09/2013  . HTN (hypertension) 12/09/2013  . BMI 36.0-36.9,adult 12/09/2013   Dierdre Highman, PT, DPT 03/14/18    9:48 AM    [facility] [address] [place], [place], [address] Phone: [phone]   Fax:  [phone]  Name: Jocelyn [name] MRN: [id number] Date of Birth: 1966/02/14

## 2018-03-14 NOTE — Patient Instructions (Signed)
Home exercise program for your left arm:  Do these every single day, 3 times per day!  1. Sit on a firm surface and clasp your hands together.  Slowly reach toward your left foot and let your head relax (your arms should be on either side of your left leg).  HOLD FOR A SLOW COUNT OF 10, then return to starting position.  Do 10. Do 3 sessions per day.  We also discussed positioning at night when on your back and when on your right side.  Following these recommendations should help you avoid shoulder pain.

## 2018-03-14 NOTE — Patient Instructions (Addendum)
SIT TO STAND: No Device    Sit in sturdy chair with mirror in front of you.  Sit with feet shoulder-width apart, on floor. Lean chest forward, raise hips up from surface. Straighten hips and knees. Weight bear equally on left and right sides. _10__ reps per set, _2__ sets per day, __5-7_ days per week.  Copyright  VHI. All rights reserved.   Mini Squat: Double Leg    With feet shoulder width apart, reach forward for balance and do a mini squat. Keep knees in line with second toe. Knees do not go past toes.  Put chair behind you for safety and think about "aiming bottom for chair"  Make sure you stay in the middle.  Repeat 10___ times per set. Rest ___ seconds after set. Do _1__ sets per session. Do 2 times per day.  http://plyo.exer.us/70   Copyright  VHI. All rights reserved.    Hip Abduction (Standing)    Stand with support. Squeeze pelvic floor and hold. Lift right leg out to side, keeping toe forward. Hold for _2-3__ seconds. Relax for _2__ seconds.  Repeat __10_ times. Do 2___ times a day. Repeat with right leg and left leg.  Make sure left hip doesn't drop and you don't lock out left knee.    Copyright  VHI. All rights reserved.

## 2018-03-20 ENCOUNTER — Encounter: Payer: 59 | Admitting: Registered Nurse

## 2018-03-20 ENCOUNTER — Encounter: Payer: 59 | Admitting: Physical Medicine & Rehabilitation

## 2018-03-21 ENCOUNTER — Encounter: Payer: Self-pay | Admitting: Registered Nurse

## 2018-03-21 ENCOUNTER — Encounter: Payer: 59 | Attending: Registered Nurse | Admitting: Registered Nurse

## 2018-03-21 VITALS — BP 114/75 | HR 67 | Ht 67.0 in | Wt 182.0 lb

## 2018-03-21 DIAGNOSIS — I69398 Other sequelae of cerebral infarction: Secondary | ICD-10-CM | POA: Diagnosis not present

## 2018-03-21 DIAGNOSIS — F1510 Other stimulant abuse, uncomplicated: Secondary | ICD-10-CM | POA: Diagnosis not present

## 2018-03-21 DIAGNOSIS — D638 Anemia in other chronic diseases classified elsewhere: Secondary | ICD-10-CM | POA: Diagnosis not present

## 2018-03-21 DIAGNOSIS — F17210 Nicotine dependence, cigarettes, uncomplicated: Secondary | ICD-10-CM | POA: Diagnosis not present

## 2018-03-21 DIAGNOSIS — E876 Hypokalemia: Secondary | ICD-10-CM

## 2018-03-21 DIAGNOSIS — Z720 Tobacco use: Secondary | ICD-10-CM

## 2018-03-21 DIAGNOSIS — I10 Essential (primary) hypertension: Secondary | ICD-10-CM | POA: Diagnosis not present

## 2018-03-21 DIAGNOSIS — I619 Nontraumatic intracerebral hemorrhage, unspecified: Secondary | ICD-10-CM

## 2018-03-21 DIAGNOSIS — R269 Unspecified abnormalities of gait and mobility: Secondary | ICD-10-CM | POA: Diagnosis not present

## 2018-03-21 DIAGNOSIS — Z79899 Other long term (current) drug therapy: Secondary | ICD-10-CM | POA: Diagnosis not present

## 2018-03-21 DIAGNOSIS — I610 Nontraumatic intracerebral hemorrhage in hemisphere, subcortical: Secondary | ICD-10-CM

## 2018-03-21 NOTE — Progress Notes (Signed)
Subjective:    Patient ID: Jocelyn [name], female    DOB: May 29, 1966, 52 y.o.   MRN: [id number]  HPI: Ms. EUGENE ZEIDERS is a 52 year old female who is here for transitional care visit in follow up of her intracerebral hemorrhage Right basal Ganglia, hypertension and tobacco abuse. She has been home attending outpatient therapies at [facility]. She denies any pain. She rated her pain 0.  She reports good appetite. Arrived in wheelchair. Husband in room, all questions answered.   Pain Inventory Average Pain 0 Pain Right Now 0 My pain is na  In the last 24 hours, has pain interfered with the following? General activity 0 Relation with others 0 Enjoyment of life 0 What TIME of day is your pain at its worst? na Sleep (in general) Fair  Pain is worse with: na Pain improves with: na Relief from Meds: na  Mobility walk without assistance walk with assistance use a walker do you drive?  no transfers alone  Function employed # of hrs/week 40 I need assistance with the following:  dressing, bathing, meal prep, household duties and shopping  Neuro/Psych No problems in this area  Prior Studies Any changes since last visit?  no  Physicians involved in your care Any changes since last visit?  no   Family History  Problem Relation Age of Onset  . Diabetes Mellitus II Neg Hx   . Hypertension Neg Hx    Social History   Socioeconomic History  . Marital status: Married    Spouse name: Not on file  . Number of children: Not on file  . Years of education: Not on file  . Highest education level: Not on file  Occupational History  . Not on file  Social Needs  . Financial resource strain: Not on file  . Food insecurity:    Worry: Not on file    Inability: Not on file  . Transportation needs:    Medical: Not on file    Non-medical: Not on file  Tobacco Use  . Smoking status: Current Every Day Smoker    Packs/day: 0.20    Years: 5.00    Pack years: 1.00     Types: Cigarettes  . Smokeless tobacco: Never Used  Substance and Sexual Activity  . Alcohol use: Not Currently    Alcohol/week: 0.0 oz  . Drug use: No  . Sexual activity: Yes    Birth control/protection: Abstinence  Lifestyle  . Physical activity:    Days per week: Not on file    Minutes per session: Not on file  . Stress: Not on file  Relationships  . Social connections:    Talks on phone: Not on file    Gets together: Not on file    Attends religious service: Not on file    Active member of club or organization: Not on file    Attends meetings of clubs or organizations: Not on file    Relationship status: Not on file  Other Topics Concern  . Not on file  Social History Narrative  . Not on file   Past Surgical History:  Procedure Laterality Date  . MYOMECTOMY     Past Medical History:  Diagnosis Date  . Anemia   . Hypertension   . ICH (intracerebral hemorrhage) (HCC) 02/2018   Ht  (1.702 m)   Wt 182 lb (82.6 kg)   BMI 28.51 kg/m   Opioid Risk Score:   Fall Risk Score:  `  1  Depression screen PHQ 2/9  Depression screen PHQ 2/9 11/05/2015  Decreased Interest 0  Down, Depressed, Hopeless 0  PHQ - 2 Score 0     Review of Systems  Constitutional: Negative.   HENT: Negative.   Eyes: Negative.   Respiratory: Negative.   Cardiovascular: Negative.   Gastrointestinal: Negative.   Endocrine: Negative.   Genitourinary: Negative.   Musculoskeletal: Positive for gait problem.  Skin: Negative.   Allergic/Immunologic: Negative.   Hematological: Negative.   Psychiatric/Behavioral: Negative.   All other systems reviewed and are negative.      Objective:   Physical Exam  Constitutional: She is oriented to person, place, and time. She appears well-developed and well-nourished.  HENT:  Head: Normocephalic and atraumatic.  Neck: Normal range of motion. Neck supple.  Cardiovascular: Normal rate and regular rhythm.  Pulmonary/Chest: Effort normal and  breath sounds normal.  Musculoskeletal:  Normal Muscle Bulk and Muscle Testing Reveals:  Upper Extremities: Left Decreased ROM 45 Degrees and Muscle Strength 3/4 Right: Full ROM and Muscle Strength 4/5 Lower Extremities: Right Full ROM and Muscle Strength 5/5 Left: Decreased ROM and Muscle Strength 4/5 Wearing Left AFO Arrived in wheelchair  Neurological: She is alert and oriented to person, place, and time.  Skin: Skin is warm and dry.  Psychiatric: She has a normal mood and affect.  Nursing note and vitals reviewed.         Assessment & Plan:  1. ICH: Right Basal Ganglia: Continue Outpatient Therapies. Has F?U with Neurology 2. Hypertension: Continue current medication regimen: PCP Following 3. Tobacco Abuse: Encouraged Smoking Cessation: She verbalizes understanding  30 minutes of face to face patient care time was spent during this visit. All questions were encouraged and answered.   F/U with Dr. Wynn Banker in 4-6 weeks.

## 2018-03-23 ENCOUNTER — Ambulatory Visit: Payer: 59 | Admitting: Physician Assistant

## 2018-03-24 ENCOUNTER — Ambulatory Visit: Payer: 59 | Admitting: Rehabilitation

## 2018-03-24 ENCOUNTER — Encounter: Payer: Self-pay | Admitting: Rehabilitation

## 2018-03-24 DIAGNOSIS — M6281 Muscle weakness (generalized): Secondary | ICD-10-CM

## 2018-03-24 DIAGNOSIS — R29818 Other symptoms and signs involving the nervous system: Secondary | ICD-10-CM

## 2018-03-24 DIAGNOSIS — I69154 Hemiplegia and hemiparesis following nontraumatic intracerebral hemorrhage affecting left non-dominant side: Secondary | ICD-10-CM

## 2018-03-24 DIAGNOSIS — R2681 Unsteadiness on feet: Secondary | ICD-10-CM

## 2018-03-24 DIAGNOSIS — I69252 Hemiplegia and hemiparesis following other nontraumatic intracranial hemorrhage affecting left dominant side: Secondary | ICD-10-CM | POA: Diagnosis not present

## 2018-03-25 NOTE — Therapy (Signed)
[facility] [address] [place], [place], [address] Phone: [phone]   Fax:  [phone]  Physical Therapy Treatment  Patient Details  Name: Jocelyn [name] MRN: [id number] Date of Birth: Mar 28, 1966 Referring Provider: Dr. Claudette Laws   Encounter Date: 03/24/2018  PT End of Session - 03/25/18 0754    Visit Number  3    Number of Visits  17    Date for PT Re-Evaluation  05/13/18    Authorization Type  [employer] - VL: 100 combined PT, OT, ST    Authorization - Visit Number  3 PT to track PT visits, OT to track OT visits    Authorization - Number of Visits  50 To ensure we do not go over on visits, PT split    PT Start Time  1616    PT Stop Time  1701    PT Time Calculation (min)  45 min    Activity Tolerance  Patient tolerated treatment well    Behavior During Therapy  [facility] for tasks assessed/performed       Past Medical History:  Diagnosis Date  . Anemia   . Hypertension   . ICH (intracerebral hemorrhage) (HCC) 02/2018    Past Surgical History:  Procedure Laterality Date  . MYOMECTOMY      There were no vitals filed for this visit.  Subjective Assessment - 03/24/18 1620    Subjective  Pt reports no changes since last visit.  Had good check up with K[facility].      Patient is accompained by:  Family member    Pertinent History  HTN, tobacco use, anemia, bursitis of L hip    Limitations  Walking    How long can you stand comfortably?  short duration    How long can you walk comfortably?  household distances    Patient Stated Goals  to be able to walk without any type of AD or AFO and to drive again    Currently in Pain?  No/denies                       [facility] Adult PT Treatment/Exercise - 03/25/18 0001      Ambulation/Gait   Ambulation/Gait  Yes    Ambulation/Gait Assistance  5: Supervision;4: Min guard    Ambulation/Gait Assistance Details  Utilized Bioness during gait with lower  leg unit for improved L ankle DF during swing and L hamstring to prevent L knee hyperextension during stance.  Pt able to ambulate without AD today and instanctly noted improved L knee control with use of Bioness. Adjustments needed to  be made to lower unit to avoid supination with DF, but once this was done, pt had great contraction.  Pt cued to attempt to activate when she felt stim during gait.  Also had pt ambulate following NMR tasks with lower unit on only to assess carryover of knee control in which she was able to do when she ambulates slowly and is cued for improved L hip protraction, however she does have intermittent recurvatum but is not as forceful as when she was last seen.  Also turned both units off and had her ambulate to assess carryover.  She demos ankle supination during swing, however did not catch L foot during gait and noted approx same knee control as mentioned previously.  At end of session had pt ambulate with her AFO and no AD.  Note that L knree recurvatum is actually increased  with wear of brace.  Question whether brace is too firm.  Will continue to assess at future sessions.     Ambulation Distance (Feet)  115 Feet x 5 reps    Assistive device  None    Gait Pattern  Step-through pattern;Lateral trunk lean to right;Left flexed knee in stance;Decreased hip/knee flexion - left;Decreased dorsiflexion - left;Decreased step length - left    Ambulation Surface  Level;Indoor      Self-Care   Self-Care  Other Self-Care Comments    Other Self-Care Comments   Encouraged pt to increase ambulation when in community with use of RW.  Pt seems somewhat hesitant but is willing to do so.  She reports she is going to [employer] outlets this weekend.  Encouraged her to ambulate as able but have husband bring w/c in car so that she can use when needed.  Also encouraged seated rest breaks when needed as they have many benches throughout outlets.  Pt and husband verbalized understanding.       Neuro  Re-ed    Neuro Re-ed Details   NMR for LLE with utilization of Bioness.  LLE on 6" step advancing RLE to/from step with single UE support when Bioness on.  Cues to focus on L knee control in stance.  Performed x 15 reps.  Standing with L hip in slight extension and toes on ground elevating heel towards buttocks for isolated hamstring activaiton x 15 reps with UE support.  Tactile cues at ant hip to avoid hip flexion       Modalities   Modalities  Electrical Stimulation      Electrical Stimulation   Electrical Stimulation Location  L lower leg, L post thigh    Electrical Stimulation Action  L ankle DF, L hamstrings    Electrical Stimulation Parameters  See tablet 2    Electrical Stimulation Goals  Neuromuscular facilitation             PT Education - 03/25/18 0754    Education provided  Yes    Education Details  see self care, purpose of Bioness    Person(s) Educated  Patient;Spouse    Methods  Explanation    Comprehension  Verbalized understanding       PT Short Term Goals - 03/14/18 1357      PT SHORT TERM GOAL #1   Title  Pt will participate in further balance assessment with BERG balance assessment    Baseline  45/56 on 03/14/18    Time  4    Period  Weeks    Status  Achieved      PT SHORT TERM GOAL #2   Title  Pt will participate in initial set up of functional electrical stimulation for LLE    Time  4    Period  Weeks    Status  New      PT SHORT TERM GOAL #3   Title  Pt will improve LE strength and WB/activation of LLE with five time sit to stand decrease to <20 seconds (more equal WB through R and LLE during sit <> stand)    Baseline  25 sec    Time  4    Period  Weeks    Status  New      PT SHORT TERM GOAL #4   Title  Pt will improve gait velocity with RW and AFO to >1.0 ft/sec    Baseline  .8 ft/sec    Time  4  Period  Weeks    Status  New      PT SHORT TERM GOAL #5   Title  Pt will initiate gait training without RW (with cane or no AD) on indoor  surfaces with min A    Time  4    Period  Weeks    Status  New        PT Long Term Goals - 03/14/18 1610      PT LONG TERM GOAL #1   Title  Pt will perform HEP with supervision    Time  8    Period  Weeks    Status  New    Target Date  05/13/18      PT LONG TERM GOAL #2   Title  Pt will improve overall function to >/= 60% on FOTO    Baseline  40%    Time  8    Period  Weeks    Status  New    Target Date  05/13/18      PT LONG TERM GOAL #3   Title  Pt will improve balance, decrease falls risk as indicated by increase in BERG score by 8 points    Baseline  TBD    Time  8    Period  Weeks    Status  New    Target Date  05/13/18      PT LONG TERM GOAL #4   Title  Pt will improve gait velocity to >/= 1.8 ft/sec with most appropriate AFO and LRAD    Time  8    Period  Weeks    Status  New    Target Date  05/13/18      PT LONG TERM GOAL #5   Title  Pt will improve five time sit to stand to </= 16 seconds with equal WB through R/LLE    Time  8    Period  Weeks    Status  New    Target Date  05/13/18      Additional Long Term Goals   Additional Long Term Goals  Yes      PT LONG TERM GOAL #6   Title  Pt will ambulate x 230' indoors and 250' outside over curbs and uneven pavement with LRAD/AFO and supervision    Time  8    Period  Weeks    Status  New    Target Date  05/13/18            Plan - 03/25/18 0754    Clinical Impression Statement  Skilled session focused on set up of Bioness for L ankle DF during swing phase of gait and L hamstrings to reduce L knee recurvatum during stance phase of gait. Pt tolerated very well and is making excellent progress with all aspects of mobility.      Rehab Potential  Good    PT Frequency  2x / week    PT Duration  8 weeks    PT Treatment/Interventions  ADLs/Self Care Home Management;Electrical Stimulation;DME Instruction;Gait training;Stair training;Functional mobility training;Therapeutic activities;Therapeutic  exercise;Balance training;Neuromuscular re-education;Patient/family education;Orthotic Fit/Training;Passive range of motion;Taping    PT Next Visit Plan  Continue NMR with Bioness, gait without device (cane outdoors?), look at ottobock PLS brace,   NMR for trunk and LLE.  Sit <> Stand with more equal WB through R/LLE    Consulted and Agree with Plan of Care  Patient;Family member/caregiver    Family Member Consulted  husband  Patient will benefit from skilled therapeutic intervention in order to improve the following deficits and impairments:  Abnormal gait, Decreased balance, Decreased strength, Difficulty walking, Postural dysfunction, Impaired sensation, Pain  Visit Diagnosis: Muscle weakness (generalized)  Other symptoms and signs involving the nervous system  Unsteadiness on feet  Hemiplegia and hemiparesis following nontraumatic intracerebral hemorrhage affecting left non-dominant side [facility])     Problem List Patient Active Problem List   Diagnosis Date Noted  . Slow transit constipation   . Greater trochanteric bursitis of left hip   . Acute blood loss anemia   . Flaccid monoplegia of upper extremity (HCC)   . Hyperlipemia 02/16/2018  . Cerebral edema (HCC) 02/16/2018  . Benign essential HTN   . Anemia of chronic disease   . Amphetamine abuse (HCC)   . Hypokalemia   . 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 Pneumonia, community acquired 09/23/2015  . Microcytic hypochromic anemia 09/23/2015  . Tobacco abuse 09/23/2015  . Pneumonia 09/23/2015  . Nicotine addiction 12/09/2013  . Microcytic anemia 12/09/2013  . HTN (hypertension) 12/09/2013  . BMI 36.0-36.9,adult 12/09/2013    Harriet Butte, PT, MPT [facility] [address] [place], [place], [address] Phone: [phone]   Fax:  [phone] 03/25/18, 7:57 AM  Name: VADIE PRINCIPATO MRN: [id number] Date of Birth: [birth year]

## 2018-03-28 ENCOUNTER — Ambulatory Visit: Payer: 59

## 2018-03-28 DIAGNOSIS — I69252 Hemiplegia and hemiparesis following other nontraumatic intracranial hemorrhage affecting left dominant side: Secondary | ICD-10-CM | POA: Diagnosis not present

## 2018-03-28 DIAGNOSIS — R2689 Other abnormalities of gait and mobility: Secondary | ICD-10-CM

## 2018-03-28 NOTE — Therapy (Signed)
[facility] [address] [place], [place], [address] Phone: [phone]   Fax:  [phone]  Physical Therapy Treatment  Patient Details  Name: Jocelyn [name] MRN: [id number] Date of Birth: 04-19-1966 Referring Provider: Dr. Claudette Laws   Encounter Date: 03/28/2018  PT End of Session - 03/28/18 1254    Visit Number  4    Number of Visits  17    Date for PT Re-Evaluation  05/13/18    Authorization Type  [employer] - VL: 100 combined PT, OT, ST    Authorization - Visit Number  3    Authorization - Number of Visits  50    PT Start Time  1107    PT Stop Time  1145    PT Time Calculation (min)  38 min    Equipment Utilized During Treatment  -- min guard to S    Activity Tolerance  Patient tolerated treatment well    Behavior During Therapy  [facility] for tasks assessed/performed       Past Medical History:  Diagnosis Date  . Anemia   . Hypertension   . ICH (intracerebral hemorrhage) (HCC) 02/2018    Past Surgical History:  Procedure Laterality Date  . MYOMECTOMY      There were no vitals filed for this visit.  Subjective Assessment - 03/28/18 1110    Subjective  Pt denied falls or changes since last visit.     Pertinent History  HTN, tobacco use, anemia, bursitis of L hip    Limitations  Walking    How long can you stand comfortably?  short duration    How long can you walk comfortably?  household distances    Patient Stated Goals  to be able to walk without any type of AD or AFO and to drive again                       [facility] Adult PT Treatment/Exercise - 03/28/18 1113      Ambulation/Gait   Ambulation/Gait  Yes    Ambulation/Gait Assistance  5: Supervision;4: Min guard    Ambulation/Gait Assistance Details  Pt amb. outdoors with and without SPC with cues for sequencing and heel strike, along with reciprocal arm swing.  L genu recurvatum began after approx. 100'. Trialed Ottobock PLS AFO with  and without wedge and pt's L AFO with and without wedge. Pt used SPC intermittently. Tactile cues provided by PT (behind pt's knee) to decr. L genu recurvatum, especially at end of session 2/2 fatigue.    Ambulation Distance (Feet)  200 Feet outdoors, 115'x3 indoors    Assistive device  Straight cane;None    Gait Pattern  Step-through pattern;Lateral trunk lean to right;Left flexed knee in stance;Decreased hip/knee flexion - left;Decreased dorsiflexion - left;Decreased step length - left    Ambulation Surface  Level;Unlevel;Indoor;Outdoor;Paved             PT Education - 03/28/18 1253    Education provided  Yes    Education Details  PT discussed utilizing a heel wedge in L shoe to improve L knee control. PT also discussed potentially requesting prescription for L Ottobock PLS AFO, as it is lighter and pt demonstrated improvement in gait deviations.     Person(s) Educated  Patient;Spouse    Methods  Explanation    Comprehension  Verbalized understanding       PT Short Term Goals - 03/14/18 1357      PT SHORT  TERM GOAL #1   Title  Pt will participate in further balance assessment with BERG balance assessment    Baseline  45/56 on 03/14/18    Time  4    Period  Weeks    Status  Achieved      PT SHORT TERM GOAL #2   Title  Pt will participate in initial set up of functional electrical stimulation for LLE    Time  4    Period  Weeks    Status  New      PT SHORT TERM GOAL #3   Title  Pt will improve LE strength and WB/activation of LLE with five time sit to stand decrease to <20 seconds (more equal WB through R and LLE during sit <> stand)    Baseline  25 sec    Time  4    Period  Weeks    Status  New      PT SHORT TERM GOAL #4   Title  Pt will improve gait velocity with RW and AFO to >1.0 ft/sec    Baseline  .8 ft/sec    Time  4    Period  Weeks    Status  New      PT SHORT TERM GOAL #5   Title  Pt will initiate gait training without RW (with cane or no AD) on indoor  surfaces with min A    Time  4    Period  Weeks    Status  New        PT Long Term Goals - 03/14/18 1610      PT LONG TERM GOAL #1   Title  Pt will perform HEP with supervision    Time  8    Period  Weeks    Status  New    Target Date  05/13/18      PT LONG TERM GOAL #2   Title  Pt will improve overall function to >/= 60% on FOTO    Baseline  40%    Time  8    Period  Weeks    Status  New    Target Date  05/13/18      PT LONG TERM GOAL #3   Title  Pt will improve balance, decrease falls risk as indicated by increase in BERG score by 8 points    Baseline  TBD    Time  8    Period  Weeks    Status  New    Target Date  05/13/18      PT LONG TERM GOAL #4   Title  Pt will improve gait velocity to >/= 1.8 ft/sec with most appropriate AFO and LRAD    Time  8    Period  Weeks    Status  New    Target Date  05/13/18      PT LONG TERM GOAL #5   Title  Pt will improve five time sit to stand to </= 16 seconds with equal WB through R/LLE    Time  8    Period  Weeks    Status  New    Target Date  05/13/18      Additional Long Term Goals   Additional Long Term Goals  Yes      PT LONG TERM GOAL #6   Title  Pt will ambulate x 230' indoors and 250' outside over curbs and uneven pavement with LRAD/AFO and supervision    Time  8    Period  Weeks    Status  New    Target Date  05/13/18            Plan - 03/28/18 1255    Clinical Impression Statement  Skilled session focused on trialing different L AFOs, with and without heel wedges to improve heel strike and decr. L genu recurvatum during stance phase of gait. Pt tolerated L Ottobock PLS AFO well with heel wedge in L shoe, pt reported brace felt lighter than her current AFO and she was able to control L genu recurvatum more consistently. Pt would continue to benefit from skilled PT to improve safety during functional mobility.     Rehab Potential  Good    PT Frequency  2x / week    PT Duration  8 weeks    PT  Treatment/Interventions  ADLs/Self Care Home Management;Electrical Stimulation;DME Instruction;Gait training;Stair training;Functional mobility training;Therapeutic activities;Therapeutic exercise;Balance training;Neuromuscular re-education;Patient/family education;Orthotic Fit/Training;Passive range of motion;Taping    PT Next Visit Plan  Continue NMR with Bioness, continue gait without device (cane outdoors?), with ottobock PLS brace and heel wedge,   NMR for trunk and LLE.  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 Slow transit constipation   . Greater trochanteric bursitis of left hip   . Acute blood loss anemia   . Flaccid monoplegia of upper extremity (HCC)   . Hyperlipemia 02/16/2018  . Cerebral edema (HCC) 02/16/2018  . Benign essential HTN   . Anemia of chronic disease   . Amphetamine abuse (HCC)   . Hypokalemia   . 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 Pneumonia, community acquired 09/23/2015  . Microcytic hypochromic anemia 09/23/2015  . Tobacco abuse 09/23/2015  . Pneumonia 09/23/2015  . Nicotine addiction 12/09/2013  . Microcytic anemia 12/09/2013  . HTN (hypertension) 12/09/2013  . BMI 36.0-36.9,adult 12/09/2013    Jocelyn [name] L 03/28/2018, 12:58 PM  [facility] [address] [place], [place], [address] Phone: [phone]   Fax:  [phone]  Name: Jocelyn [name] MRN: [id number] Date of Birth: [birth year]  Zerita Boers, PT,DPT 03/28/18 1:00 PM Phone: [phone] Fax: [phone]

## 2018-03-30 ENCOUNTER — Ambulatory Visit: Payer: 59 | Admitting: Rehabilitation

## 2018-03-31 ENCOUNTER — Ambulatory Visit: Payer: 59 | Admitting: Occupational Therapy

## 2018-03-31 ENCOUNTER — Encounter: Payer: Self-pay | Admitting: Occupational Therapy

## 2018-03-31 ENCOUNTER — Ambulatory Visit: Payer: 59

## 2018-03-31 DIAGNOSIS — M6281 Muscle weakness (generalized): Secondary | ICD-10-CM

## 2018-03-31 DIAGNOSIS — I69154 Hemiplegia and hemiparesis following nontraumatic intracerebral hemorrhage affecting left non-dominant side: Secondary | ICD-10-CM

## 2018-03-31 DIAGNOSIS — R29818 Other symptoms and signs involving the nervous system: Secondary | ICD-10-CM

## 2018-03-31 DIAGNOSIS — R293 Abnormal posture: Secondary | ICD-10-CM

## 2018-03-31 DIAGNOSIS — R2681 Unsteadiness on feet: Secondary | ICD-10-CM

## 2018-03-31 DIAGNOSIS — I69252 Hemiplegia and hemiparesis following other nontraumatic intracranial hemorrhage affecting left dominant side: Secondary | ICD-10-CM

## 2018-03-31 NOTE — Therapy (Signed)
[facility] [address] [place], [place], [address] Phone: [phone]   Fax:  [phone]  Occupational Therapy Treatment  Patient Details  Name: Jocelyn [name] MRN: [id number] Date of Birth: Oct 19, 1966 Referring Provider: Dr. Claudette Laws   Encounter Date: 03/31/2018  OT End of Session - 03/31/18 1506    Visit Number  3    Number of Visits  16    Date for OT Re-Evaluation  05/08/18    Authorization Type  BC[employer]  - 100 visit limit combined for PT and OT    OT Start Time  1017    OT Stop Time  1100    OT Time Calculation (min)  43 min    Activity Tolerance  Patient tolerated treatment well    Behavior During Therapy  [facility] for tasks assessed/performed       Past Medical History:  Diagnosis Date  . Anemia   . Hypertension   . ICH (intracerebral hemorrhage) (HCC) 02/2018    Past Surgical History:  Procedure Laterality Date  . MYOMECTOMY      There were no vitals filed for this visit.  Subjective Assessment - 03/31/18 1100    Subjective   I tied my shoes!    Patient is accompained by:  Family member    Pertinent History  R BG hemorrhage with edema and midline shift 02/13/2018    Patient Stated Goals  I want to be able to walk on my own without the brace or cane, I want to drive, I want my "normal" back    Currently in Pain?  No/denies    Pain Score  0-No pain                   OT Treatments/Exercises (OP) - 03/31/18 0001      Neurological Re-education Exercises   Other Exercises 1  Patient walked into clinic withut walker today!  Patient reports that she found positioning information helpful, and we reviewed seated stretch for left arm. Neuromuscular reeducation to address postural control and arm and body relationship.  Patient with decreased ability to sustain muscale activation for trunk extension, and needed facilitation for more neutral alignment of trunk in sititng.  Patient overactive  in pectoralis and upper trap, and has difficulty reducing this activation for better reach patterns.               OT Education - 03/31/18 1506    Education provided  Yes    Education Details  Reviewed HEP and positioning    Person(s) Educated  Patient;Spouse    Methods  Explanation;Demonstration;Tactile cues;Verbal cues    Comprehension  Verbalized understanding;Returned demonstration       OT Short Term Goals - 03/14/18 1311      OT SHORT TERM GOAL #1   Title  Pt and husband will be mod I with HEP for LUE for AROM/strength, and positioning. - 04/10/2018    Status  On-going      OT SHORT TERM GOAL #2   Title  Pt will be supervision for bathing    Status  On-going      OT SHORT TERM GOAL #3   Title  Pt will be mod I with dressing, AE prn    Status  On-going      OT SHORT TERM GOAL #4   Title  Pt will be mod I with toilet transfers    Status  On-going      OT SHORT  TERM GOAL #5   Title  Pt will be mod I with splint wear and care, prn    Status  On-going      OT SHORT TERM GOAL #6   Title  Pt will demonstrate the ability for bilateral low reach to pick up a light weight object    Status  On-going      OT SHORT TERM GOAL #7   Title  Pt will demonstrate the ability for grasp and release for light small object    Status  On-going        OT Long Term Goals - 03/14/18 1312      OT LONG TERM GOAL #1   Title  Pt will be mod I for LUE HEP for AROM/strength/functional use - 05/08/2018    Status  On-going      OT LONG TERM GOAL #2   Title  Pt will be mod I with bathing    Status  On-going      OT LONG TERM GOAL #3   Title  Pt will be mod I with shower transfers    Status  On-going      OT LONG TERM GOAL #4   Title  Pt will be min a for simple hot meal prep    Status  On-going      OT LONG TERM GOAL #5   Title  Pt will be mod  I with loading clothes washer and dryer and folding clothes    Status  On-going      OT LONG TERM GOAL #6   Title  Pt will verbalize  understanding of driving evaluation information    Status  On-going      OT LONG TERM GOAL #7   Title  Pt will demonstrate ability to use LUE for mid reach into kitchen cabinet to obtain ingredients when cooking    Status  On-going            Plan - 03/31/18 1507    Clinical Impression Statement  Patient is making significant improvement in her functional mobility. Patient is faithful with her home exercise program    Occupational Profile and client history currently impacting functional performance  HTN, HLD, anemia, chronic bursitis, +tobacco use,     Occupational performance deficits (Please refer to evaluation for details):  ADL's;IADL's;Work;Leisure;Social Participation    Rehab Potential  Good    OT Frequency  2x / week    OT Duration  8 weeks    OT Treatment/Interventions  Self-care/ADL training;Aquatic Therapy;Cryotherapy;Moist Heat;Electrical Stimulation;DME and/or AE instruction;Neuromuscular education;Therapeutic exercise;Functional Mobility Training;Manual Therapy;Passive range of motion;Splinting;Therapeutic activities;Balance training;Patient/family education    Plan  NMR Trunk/ LUE, add to HEP, an dhome activities program    Consulted and Agree with Plan of Care  Patient    Family Member Consulted  husband       Patient will benefit from skilled therapeutic intervention in order to improve the following deficits and impairments:  Abnormal gait, Decreased balance, Decreased coordination, Decreased mobility, Decreased range of motion, Difficulty walking, Decreased strength, Impaired UE functional use, Impaired tone, Impaired sensation  Visit Diagnosis: 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  Slow transit constipation   . Greater trochanteric bursitis of left hip   . Acute blood loss anemia   . Flaccid monoplegia of upper extremity (HCC)   . Hyperlipemia 02/16/2018  . Cerebral edema (HCC) 02/16/2018  . Benign essential HTN   . Anemia of chronic disease   . Amphetamine abuse (HCC)   . Hypokalemia   . 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 Pneumonia, community acquired 09/23/2015  . Microcytic hypochromic anemia 09/23/2015  . Tobacco abuse 09/23/2015  . Pneumonia 09/23/2015  . Nicotine addiction 12/09/2013  . Microcytic anemia 12/09/2013  . HTN (hypertension) 12/09/2013  . BMI 36.0-36.9,adult 12/09/2013    Collier Salina , OTR/L 03/31/2018, 3:10 PM  [facility] [address] [place], [place], [address] Phone: [phone]   Fax:  [phone]  Name: KRYSTALL KRUCKENBERG MRN: [id number] Date of Birth: [birth year]

## 2018-04-04 ENCOUNTER — Encounter: Payer: Self-pay | Admitting: Physical Therapy

## 2018-04-04 ENCOUNTER — Ambulatory Visit: Payer: 59 | Admitting: Occupational Therapy

## 2018-04-04 ENCOUNTER — Ambulatory Visit: Payer: 59 | Admitting: Physical Therapy

## 2018-04-04 ENCOUNTER — Encounter: Payer: Self-pay | Admitting: Occupational Therapy

## 2018-04-04 DIAGNOSIS — R29818 Other symptoms and signs involving the nervous system: Secondary | ICD-10-CM

## 2018-04-04 DIAGNOSIS — R293 Abnormal posture: Secondary | ICD-10-CM

## 2018-04-04 DIAGNOSIS — I69252 Hemiplegia and hemiparesis following other nontraumatic intracranial hemorrhage affecting left dominant side: Secondary | ICD-10-CM | POA: Diagnosis not present

## 2018-04-04 DIAGNOSIS — I69154 Hemiplegia and hemiparesis following nontraumatic intracerebral hemorrhage affecting left non-dominant side: Secondary | ICD-10-CM

## 2018-04-04 DIAGNOSIS — R2681 Unsteadiness on feet: Secondary | ICD-10-CM

## 2018-04-04 DIAGNOSIS — R2689 Other abnormalities of gait and mobility: Secondary | ICD-10-CM

## 2018-04-04 DIAGNOSIS — M6281 Muscle weakness (generalized): Secondary | ICD-10-CM

## 2018-04-04 NOTE — Patient Instructions (Addendum)
Feet Heel-Toe "Tandem", Head Motion - Eyes Open    With eyes open, right foot directly in front of the other, move head slowly: right/left, up/down, diagonal up-right/down-left and diagonal up-left/down-right. Repeat __10__ times each direction.  Copyright  VHI. All rights reserved.    Feet Together (Compliant Surface) Head Motion - Eyes Closed    Stand on compliant surface: __pillow or foam______ with feet together. Close eyes and move head slowly, right/left, up/down, diagonal up-right/down-left and diagonal up-left/down-right. Repeat __10__ times each direction.  Copyright  VHI. All rights reserved.   Toe / Heel Raise (Standing)    Standing with support, raise heels, then rock back on heels and raise toes. Repeat __10__ times.  Copyright  VHI. All rights reserved.

## 2018-04-04 NOTE — Therapy (Signed)
[facility] [address] [place], [place], [address] Phone: [phone]   Fax:  [phone]  Physical Therapy Treatment  Patient Details  Name: Jocelyn [name] MRN: [id number] Date of Birth: 06-02-1966 Referring Provider: Dr. Claudette Laws   Encounter Date: 04/04/2018  PT End of Session - 04/04/18 1206    Visit Number  5    Number of Visits  17    Date for PT Re-Evaluation  05/13/18    Authorization Type  [employer] - VL: 100 combined PT, OT, ST    Authorization - Visit Number  4    Authorization - Number of Visits  50    PT Start Time  1100    PT Stop Time  1146    PT Time Calculation (min)  46 min    Equipment Utilized During Treatment  -- min guard to S    Activity Tolerance  Patient tolerated treatment well    Behavior During Therapy  [facility] for tasks assessed/performed       Past Medical History:  Diagnosis Date  . Anemia   . Hypertension   . ICH (intracerebral hemorrhage) (HCC) 02/2018    Past Surgical History:  Procedure Laterality Date  . MYOMECTOMY      There were no vitals filed for this visit.  Subjective Assessment - 04/04/18 1104    Subjective  No falls. She went to [place], [place].    Patient is accompained by:  Family member    Pertinent History  HTN, tobacco use, anemia, bursitis of L hip    Limitations  Walking    How long can you stand comfortably?  short duration    How long can you walk comfortably?  household distances    Patient Stated Goals  to be able to walk without any type of AD or AFO and to drive again    Currently in Pain?  No/denies                       [facility] Adult PT Treatment/Exercise - 04/04/18 1100      Transfers   Transfers  Sit to Stand;Stand to Sit    Sit to Stand  5: Supervision;Without upper extremity assist;From chair/3-in-1    Sit to Stand Details (indicate cue type and reason)  visual & verbal cues on midline    Stand to Sit  5:  Supervision;With upper extremity assist;To chair/3-in-1    Stand to Sit Details  visual & verbal cues on midline      Ambulation/Gait   Ambulation/Gait  Yes    Ambulation/Gait Assistance  5: Supervision    Ambulation/Gait Assistance Details  Pt reports ambulating in home without AFO some. PT assessed 54' without AD or AFO. Slow gait with inversion in swing & initial contact on lateral foot; pt able to control ankle with slow focused gait without AFO. without full focus, she has risk of ankle sprain or injury.      Ambulation Distance (Feet)  200 Feet 200' with AFO, 60' X 1 without AFO    Assistive device  None;Other (Comment) AFO with 1/4" heel wedge     Ambulation Surface  Indoor;Level      High Level Balance   High Level Balance Activities  Head turns    High Level Balance Comments  In hall for visual reference, tactile & verbal cues on maintaining path & pace with head turns right/left, up/down & diagonals.  Knee/Hip Exercises: Machines for Strengthening   Total Gym Leg Press  BLE 80# 10 reps & LLE only 40# 10 reps.  tactile cues for left knee control             PT Education - 04/04/18 1130    Education provided  Yes    Education Details  reviewed HEP (sit/stand, squats & standing hip abduction) Added balance in corner (tandem on floor eyes open head motions, feet together on foam eyes closed head motions & standing ankle PF/DF)     Person(s) Educated  Patient;Spouse    Methods  Explanation;Demonstration;Tactile cues;Verbal cues;Handout    Comprehension  Verbalized understanding;Returned demonstration;Verbal cues required;Tactile cues required;Need further instruction       PT Short Term Goals - 04/04/18 1206      PT SHORT TERM GOAL #1   Title  Pt will participate in further balance assessment with BERG balance assessment    Baseline  45/56 on 03/14/18    Time  4    Period  Weeks    Status  Achieved      PT SHORT TERM GOAL #2   Title  Pt will participate in  initial set up of functional electrical stimulation for LLE    Time  4    Period  Weeks    Status  On-going    Target Date  04/13/18      PT SHORT TERM GOAL #3   Title  Pt will improve LE strength and WB/activation of LLE with five time sit to stand decrease to <20 seconds (more equal WB through R and LLE during sit <> stand)    Baseline  25 sec    Time  4    Period  Weeks    Status  On-going    Target Date  04/13/18      PT SHORT TERM GOAL #4   Title  Pt will improve gait velocity with RW and AFO to >1.0 ft/sec    Baseline  .8 ft/sec    Time  4    Period  Weeks    Status  On-going    Target Date  04/13/18      PT SHORT TERM GOAL #5   Title  Pt will initiate gait training without RW (with cane or no AD) on indoor surfaces with min A    Time  4    Period  Weeks    Status  On-going    Target Date  04/13/18        PT Long Term Goals - 03/14/18 0942      PT LONG TERM GOAL #1   Title  Pt will perform HEP with supervision    Time  8    Period  Weeks    Status  New    Target Date  05/13/18      PT LONG TERM GOAL #2   Title  Pt will improve overall function to >/= 60% on FOTO    Baseline  40%    Time  8    Period  Weeks    Status  New    Target Date  05/13/18      PT LONG TERM GOAL #3   Title  Pt will improve balance, decrease falls risk as indicated by increase in BERG score by 8 points    Baseline  TBD    Time  8    Period  Weeks    Status  New  Target Date  05/13/18      PT LONG TERM GOAL #4   Title  Pt will improve gait velocity to >/= 1.8 ft/sec with most appropriate AFO and LRAD    Time  8    Period  Weeks    Status  New    Target Date  05/13/18      PT LONG TERM GOAL #5   Title  Pt will improve five time sit to stand to </= 16 seconds with equal WB through R/LLE    Time  8    Period  Weeks    Status  New    Target Date  05/13/18      Additional Long Term Goals   Additional Long Term Goals  Yes      PT LONG TERM GOAL #6   Title  Pt will  ambulate x 230' indoors and 250' outside over curbs and uneven pavement with LRAD/AFO and supervision    Time  8    Period  Weeks    Status  New    Target Date  05/13/18            Plan - 04/04/18 1210    Clinical Impression Statement  Skilled session focused on HEP updating current & adding 3 balance exercises. Worked on gait with head turns to scan environment & maintain path & pace. PT also worked on LLE strength with leg press machine.     Rehab Potential  Good    PT Frequency  2x / week    PT Duration  8 weeks    PT Treatment/Interventions  ADLs/Self Care Home Management;Electrical Stimulation;DME Instruction;Gait training;Stair training;Functional mobility training;Therapeutic activities;Therapeutic exercise;Balance training;Neuromuscular re-education;Patient/family education;Orthotic Fit/Training;Passive range of motion;Taping    PT Next Visit Plan  Continue NMR with Bioness, continue gait without device (cane outdoors?), with o[place] PLS brace and heel wedge,   NMR for trunk and LLE.  Sit <> Stand with more equal WB through R/LLE    Consulted and Agree with Plan of Care  Patient;Family member/caregiver    Family Member Consulted  husband       Patient will benefit from skilled therapeutic intervention in order to improve the following deficits and impairments:  Abnormal gait, Decreased balance, Decreased strength, Difficulty walking, Postural dysfunction, Impaired sensation, Pain  Visit Diagnosis: Muscle weakness (generalized)  Other symptoms and signs involving the nervous system  Unsteadiness on feet  Abnormal posture  Other abnormalities of gait and mobility     Problem List Patient Active Problem List   Diagnosis Date Noted  . Slow transit constipation   . Greater trochanteric bursitis of left hip   . Acute blood loss anemia   . Flaccid monoplegia of upper extremity (HCC)   . Hyperlipemia 02/16/2018  . Cerebral edema (HCC) 02/16/2018  . Benign essential  HTN   . Anemia of chronic disease   . Amphetamine abuse (HCC)   . Hypokalemia   . 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 Pneumonia, community acquired 09/23/2015  . Microcytic hypochromic anemia 09/23/2015  . Tobacco abuse 09/23/2015  . Pneumonia 09/23/2015  . Nicotine addiction 12/09/2013  . Microcytic anemia 12/09/2013  . HTN (hypertension) 12/09/2013  . BMI 36.0-36.9,adult 12/09/2013    Erikah Thumm PT, DPT 04/04/2018, 12:13 PM  [facility] [address] [place], [place], [address] Phone: [phone]   Fax:  [phone]  Name: MOMOKA STRINGFIELD MRN: [id number] Date of Birth: 10-Aug-1966

## 2018-04-04 NOTE — Therapy (Signed)
[facility] [address] [place], [place], [address] Phone: [phone]   Fax:  [phone]  Occupational Therapy Treatment  Patient Details  Name: Jocelyn [name] MRN: [id number] Date of Birth: [birth year] Referring Provider: Dr. Claudette Laws   Encounter Date: 04/04/2018  OT End of Session - 04/04/18 1254    Visit Number  4    Number of Visits  16    Date for OT Re-Evaluation  05/08/18    Authorization Type  [employer]  - 100 visit limit combined for PT and OT    OT Start Time  1148    OT Stop Time  1235    OT Time Calculation (min)  47 min    Activity Tolerance  Patient tolerated treatment well    Behavior During Therapy  [facility] for tasks assessed/performed       Past Medical History:  Diagnosis Date  . Anemia   . Hypertension   . ICH (intracerebral hemorrhage) (HCC) 02/2018    Past Surgical History:  Procedure Laterality Date  . MYOMECTOMY      There were no vitals filed for this visit.  Subjective Assessment - 04/04/18 1153    Subjective   I got rid of the shower chair and the potty chair.      Patient is accompained by:  Family member    Pertinent History  R BG hemorrhage with edema and midline shift 02/13/2018    Patient Stated Goals  I want to be able to walk on my own without the brace or cane, I want to drive, I want my "normal" back    Currently in Pain?  No/denies    Pain Score  0-No pain                   OT Treatments/Exercises (OP) - 04/04/18 1224      ADLs   Toileting  Patient has removed commode from bathroom    Bathing  removed the tub bench from shower last night.  Husband providing supervision for transfers.  Discussed methods to back off supervision as able and comfortable.      Functional Mobility  Patient is not using walker in home, but spoke with PT and patient will have walker available when ambulating long distances.  Patient no longer needs walker splint - removed  from walker.      Home Maintenance  Patient encouraged to begin to use left arm functionally both for bilateral tasks - carrying a laundry basket, putting on a pillowcase as demo'd in clinic, and unilateral tasks, turning on/off light switch, opening doors, drawers, and carrying lightweight items.  Patient's husband presewnt as she attempted various tasks like this in clinic.  Discussed home activities program in addition to HEP.        Neurological Re-education Exercises   Other Exercises 1  Set up HEP in supine to address left proximal control UE.  See patient instructions.  Also worked on low reach, grasp and release, and manipulation skills with emphasis on dropping shoulder  and maintaining upright posture for best arm functioning.               OT Education - 04/04/18 1254    Education provided  Yes    Education Details  HEP Shoulder and Initiated concept of home activity program    Person(s) Educated  Patient;Spouse    Methods  Explanation;Demonstration;Tactile cues;Verbal cues;Handout    Comprehension  Verbalized understanding;Returned demonstration;Need further instruction  OT Short Term Goals - 04/04/18 1256      OT SHORT TERM GOAL #1   Title  Pt and husband will be mod I with HEP for LUE for AROM/strength, and positioning. - 04/10/2018    Status  On-going      OT SHORT TERM GOAL #2   Title  Pt will be supervision for bathing    Status  Achieved      OT SHORT TERM GOAL #3   Title  Pt will be mod I with dressing, AE prn    Status  On-going      OT SHORT TERM GOAL #4   Title  Pt will be mod I with toilet transfers    Status  Achieved      OT SHORT TERM GOAL #5   Status  On-going      OT SHORT TERM GOAL #6   Title  Pt will demonstrate the ability for bilateral low reach to pick up a light weight object    Status  Achieved      OT SHORT TERM GOAL #7   Title  Pt will demonstrate the ability for grasp and release for light small object    Status  Achieved         OT Long Term Goals - 04/04/18 1257      OT LONG TERM GOAL #1   Title  Pt will be mod I for LUE HEP for AROM/strength/functional use - 05/08/2018    Status  On-going      OT LONG TERM GOAL #2   Title  Pt will be mod I with bathing    Status  On-going      OT LONG TERM GOAL #3   Title  Pt will be mod I with shower transfers    Status  On-going      OT LONG TERM GOAL #4   Title  Pt will be min a for simple hot meal prep    Status  On-going      OT LONG TERM GOAL #5   Title  Pt will be mod  I with loading clothes washer and dryer and folding clothes    Status  On-going      OT LONG TERM GOAL #6   Title  Pt will verbalize understanding of driving evaluation information    Status  On-going      OT LONG TERM GOAL #7   Title  Pt will demonstrate ability to use LUE for mid reach into kitchen cabinet to obtain ingredients when cooking    Status  On-going            Plan - 04/04/18 1255    Clinical Impression Statement  Patient is making excellent progress and is reducing her reliance on adaptive equipment for ADL's    Occupational Profile and client history currently impacting functional performance  HTN, HLD, anemia, chronic bursitis, +tobacco use,     Occupational performance deficits (Please refer to evaluation for details):  ADL's;IADL's;Work;Leisure;Social Participation    Rehab Potential  Good    OT Frequency  2x / week    OT Duration  8 weeks    OT Treatment/Interventions  Self-care/ADL training;Aquatic Therapy;Cryotherapy;Moist Heat;Electrical Stimulation;DME and/or AE instruction;Neuromuscular education;Therapeutic exercise;Functional Mobility Training;Manual Therapy;Passive range of motion;Splinting;Therapeutic activities;Balance training;Patient/family education    Plan  NMR Trunk/ LUE, develop home activities program    Consulted and Agree with Plan of Care  Patient    Family Member Consulted  husband  Patient will benefit from skilled therapeutic  intervention in order to improve the following deficits and impairments:  Abnormal gait, Decreased balance, Decreased coordination, Decreased mobility, Decreased range of motion, Difficulty walking, Decreased strength, Impaired UE functional use, Impaired tone, Impaired sensation  Visit Diagnosis: Muscle weakness (generalized)  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 Slow transit constipation   . Greater trochanteric bursitis of left hip   . Acute blood loss anemia   . Flaccid monoplegia of upper extremity (HCC)   . Hyperlipemia 02/16/2018  . Cerebral edema (HCC) 02/16/2018  . Benign essential HTN   . Anemia of chronic disease   . Amphetamine abuse (HCC)   . Hypokalemia   . 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 Pneumonia, community acquired 09/23/2015  . Microcytic hypochromic anemia 09/23/2015  . Tobacco abuse 09/23/2015  . Pneumonia 09/23/2015  . Nicotine addiction 12/09/2013  . Microcytic anemia 12/09/2013  . HTN (hypertension) 12/09/2013  . BMI 36.0-36.9,adult 12/09/2013    Collier Salina, OTR/L 04/04/2018, 12:59 PM  [facility] [address] [place], [place], [address] Phone: [phone]   Fax:  [phone]  Name: DEYJAH KINDEL MRN: [id number] Date of Birth: August 19, 1966

## 2018-04-04 NOTE — Patient Instructions (Addendum)
Shoulder exercises Lying on your back on your bed.  There should be no pain when doing these exercises - work within your comfortable range of motion.    Holding a ball in both hands - do each exercise 10 times at least daily  -press ball up toward ceiling - keep elbows tucked in toward your body.    -press ball up toward ceiling and then bend your elbows so ball touches pillow behind your head  - press ball up toward ceiling then rainbow up toward forehead and down toward hips- elbows stay straight  - press up toward ceiling and then 2 inches further toward ceiling  - press up toward ceiling and then lift head and reach further toward ceiling - middle, slight right, and slight left  -press arm toward ceiling and have Ronelle press into the ball to give you slight resistance in every direction as we practiced  -press the ball toward the ceiling and turn ball in one full revolution clockwise and one revolution counterclockwise

## 2018-04-06 ENCOUNTER — Encounter: Payer: Self-pay | Admitting: Physical Therapy

## 2018-04-06 ENCOUNTER — Ambulatory Visit: Payer: 59 | Admitting: Physical Therapy

## 2018-04-06 ENCOUNTER — Ambulatory Visit: Payer: 59 | Admitting: Occupational Therapy

## 2018-04-06 ENCOUNTER — Encounter: Payer: Self-pay | Admitting: Occupational Therapy

## 2018-04-06 DIAGNOSIS — R2681 Unsteadiness on feet: Secondary | ICD-10-CM

## 2018-04-06 DIAGNOSIS — R29818 Other symptoms and signs involving the nervous system: Secondary | ICD-10-CM

## 2018-04-06 DIAGNOSIS — R293 Abnormal posture: Secondary | ICD-10-CM

## 2018-04-06 DIAGNOSIS — I69154 Hemiplegia and hemiparesis following nontraumatic intracerebral hemorrhage affecting left non-dominant side: Secondary | ICD-10-CM

## 2018-04-06 DIAGNOSIS — I69252 Hemiplegia and hemiparesis following other nontraumatic intracranial hemorrhage affecting left dominant side: Secondary | ICD-10-CM | POA: Diagnosis not present

## 2018-04-06 DIAGNOSIS — R2689 Other abnormalities of gait and mobility: Secondary | ICD-10-CM

## 2018-04-06 DIAGNOSIS — M6281 Muscle weakness (generalized): Secondary | ICD-10-CM

## 2018-04-06 NOTE — Therapy (Signed)
[facility] [address] [place], [place], [address] Phone: [phone]   Fax:  [phone]  Physical Therapy Treatment  Patient Details  Name: Jocelyn [name] MRN: [id number] Date of Birth: 1965-12-27 Referring Provider: Dr. Claudette Laws   Encounter Date: 04/06/2018  PT End of Session - 04/06/18 1721    Visit Number  6    Number of Visits  17    Date for PT Re-Evaluation  05/13/18    Authorization Type  [employer] - VL: 100 combined PT, OT, ST    Authorization - Visit Number  6    Authorization - Number of Visits  50    PT Start Time  1105    PT Stop Time  1150    PT Time Calculation (min)  45 min    Equipment Utilized During Treatment  Gait belt min guard to S    Activity Tolerance  Patient tolerated treatment well    Behavior During Therapy  [facility] for tasks assessed/performed       Past Medical History:  Diagnosis Date  . Anemia   . Hypertension   . ICH (intracerebral hemorrhage) (HCC) 02/2018    Past Surgical History:  Procedure Laterality Date  . MYOMECTOMY      There were no vitals filed for this visit.  Subjective Assessment - 04/06/18 1100    Subjective  She did original 3 exercises and is looking for pillow for corner exercises. No falls.     Patient is accompained by:  Family member    Pertinent History  HTN, tobacco use, anemia, bursitis of L hip    Limitations  Walking    How long can you stand comfortably?  short duration    How long can you walk comfortably?  household distances    Patient Stated Goals  to be able to walk without any type of AD or AFO and to drive again    Currently in Pain?  No/denies                       [facility] Adult PT Treatment/Exercise - 04/06/18 1105      Transfers   Transfers  Sit to Stand;Stand to Sit    Sit to Stand  5: Supervision;Without upper extremity assist;From chair/3-in-1    Stand to Sit  5: Supervision;With upper extremity assist;To  chair/3-in-1      Ambulation/Gait   Ambulation/Gait  Yes    Ambulation/Gait Assistance  4: Min guard;5: Supervision    Ambulation/Gait Assistance Details  Bioness Electric Stimulation lower & thigh units: tactile & verbal cues on step length, posture & wt shift.     Ambulation Distance (Feet)  400 Feet 400' indoors rest, 400' (200' outdoors)    Assistive device  None;Other (Comment) Bioness E-stim    Ambulation Surface  Indoor;Level;Outdoor;Paved;Gravel;Grass    Stairs  Yes    Stairs Assistance  5: Supervision    Stairs Assistance Details (indicate cue type and reason)  tactile & verbal cues on wt shift & alternating pattern    Stair Management Technique  One rail Right;Alternating pattern;Forwards    Number of Stairs  4 3 reps    Ramp  5: Supervision Bioness & no AD    Ramp Details (indicate cue type and reason)  verbal & tactile cues on posture & wt shift    Curb  5: Supervision;Other (comment) Min guard, Bioness E-stim & no AD    Curb Details (indicate cue  type and reason)  verbal cues on step length, fluency and balance reaction      High Level Balance   High Level Balance Activities  Side stepping;Backward walking;Head turns Bioness E-stim & no AD, minA    High Level Balance Comments  In hall for visual reference, tactile & verbal cues on maintaining path & pace with head turns right/left, up/down & diagonals.        Neuro Re-ed    Neuro Re-ed Details   NMR for LLE with utilization of Bioness.  LLE on 6" step advancing RLE to/from step with single UE support when Bioness on.  Cues to focus on L knee control in stance.  Performed x 15 reps.  Standing with L hip in slight extension and toes on ground elevating heel towards buttocks for isolated hamstring activaiton x 15 reps with UE support.  Tactile cues at ant hip to avoid hip flexion       Knee/Hip Exercises: Machines for Strengthening   Total Gym Leg Press  BLE 90# 15 reps & LLE only 40# 15 reps.  BLE ankle PF 50# 10 reps with PT  manual cues for LLE knee control      Electrical Stimulation   Electrical Stimulation Location  L lower leg, L post thigh    Electrical Stimulation Action  lower unit stimulating ankle dorsiflexion & decreasing supination;  thigh unit stimulating hamstrings    Electrical Stimulation Parameters  See tablet 2 for details.    Electrical Stimulation Goals  Strength;Neuromuscular facilitation               PT Short Term Goals - 04/04/18 1206      PT SHORT TERM GOAL #1   Title  Pt will participate in further balance assessment with BERG balance assessment    Baseline  45/56 on 03/14/18    Time  4    Period  Weeks    Status  Achieved      PT SHORT TERM GOAL #2   Title  Pt will participate in initial set up of functional electrical stimulation for LLE    Time  4    Period  Weeks    Status  On-going    Target Date  04/13/18      PT SHORT TERM GOAL #3   Title  Pt will improve LE strength and WB/activation of LLE with five time sit to stand decrease to <20 seconds (more equal WB through R and LLE during sit <> stand)    Baseline  25 sec    Time  4    Period  Weeks    Status  On-going    Target Date  04/13/18      PT SHORT TERM GOAL #4   Title  Pt will improve gait velocity with RW and AFO to >1.0 ft/sec    Baseline  .8 ft/sec    Time  4    Period  Weeks    Status  On-going    Target Date  04/13/18      PT SHORT TERM GOAL #5   Title  Pt will initiate gait training without RW (with cane or no AD) on indoor surfaces with min A    Time  4    Period  Weeks    Status  On-going    Target Date  04/13/18        PT Long Term Goals - 03/14/18 0942      PT LONG TERM GOAL #  1   Title  Pt will perform HEP with supervision    Time  8    Period  Weeks    Status  New    Target Date  05/13/18      PT LONG TERM GOAL #2   Title  Pt will improve overall function to >/= 60% on FOTO    Baseline  40%    Time  8    Period  Weeks    Status  New    Target Date  05/13/18      PT  LONG TERM GOAL #3   Title  Pt will improve balance, decrease falls risk as indicated by increase in BERG score by 8 points    Baseline  TBD    Time  8    Period  Weeks    Status  New    Target Date  05/13/18      PT LONG TERM GOAL #4   Title  Pt will improve gait velocity to >/= 1.8 ft/sec with most appropriate AFO and LRAD    Time  8    Period  Weeks    Status  New    Target Date  05/13/18      PT LONG TERM GOAL #5   Title  Pt will improve five time sit to stand to </= 16 seconds with equal WB through R/LLE    Time  8    Period  Weeks    Status  New    Target Date  05/13/18      Additional Long Term Goals   Additional Long Term Goals  Yes      PT LONG TERM GOAL #6   Title  Pt will ambulate x 230' indoors and 250' outside over curbs and uneven pavement with LRAD/AFO and supervision    Time  8    Period  Weeks    Status  New    Target Date  05/13/18            Plan - 04/06/18 1722    Clinical Impression Statement  Bioness Electrical Stimulation improved gait including knee control with use of both lower & upper units. Skilled session working on gait including ramps, curbs, stairs, grass, scanning, sideways & backwards using stimulation units to improve movement.     Rehab Potential  Good    PT Frequency  2x / week    PT Duration  8 weeks    PT Treatment/Interventions  ADLs/Self Care Home Management;Electrical Stimulation;DME Instruction;Gait training;Stair training;Functional mobility training;Therapeutic activities;Therapeutic exercise;Balance training;Neuromuscular re-education;Patient/family education;Orthotic Fit/Training;Passive range of motion;Taping    PT Next Visit Plan  Check STGs, Continue NMR with Bioness, continue gait without device (cane outdoors?), with ottobock PLS brace and heel wedge,   NMR for trunk and LLE.  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 Slow transit constipation   . Greater trochanteric bursitis of left hip   . Acute blood loss anemia   . Flaccid monoplegia of upper extremity (HCC)   . Hyperlipemia 02/16/2018  . Cerebral edema (HCC) 02/16/2018  . Benign essential HTN   . Anemia of chronic disease   . Amphetamine abuse (HCC)   . Hypokalemia   . 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 Pneumonia, community acquired 09/23/2015  . Microcytic hypochromic anemia 09/23/2015  . Tobacco abuse 09/23/2015  . Pneumonia 09/23/2015  . Nicotine addiction 12/09/2013  . Microcytic anemia 12/09/2013  . HTN (hypertension) 12/09/2013  . BMI 36.0-36.9,adult 12/09/2013    Myrene Bougher PT, DPT 04/06/2018, 5:35 PM  [facility] [address] [place], [place], [address] Phone: [phone]   Fax:  [phone]  Name: Jocelyn [name] MRN: [id number] Date of Birth: 05-07-1966

## 2018-04-06 NOTE — Therapy (Signed)
[facility] [address] [place], [place], [address] Phone: [phone]   Fax:  [phone]  Occupational Therapy Treatment  Patient Details  Name: Jocelyn [name] MRN: [id number] Date of Birth: 07-18-1966 Referring Provider: Dr. Claudette Laws   Encounter Date: 04/06/2018  OT End of Session - 04/06/18 1317    Visit Number  5    Number of Visits  16    Date for OT Re-Evaluation  05/08/18    Authorization Type  [employer]  - 100 visit limit combined for PT and OT    OT Start Time  1018    OT Stop Time  1100    OT Time Calculation (min)  42 min    Activity Tolerance  Patient tolerated treatment well    Behavior During Therapy  [facility] for tasks assessed/performed       Past Medical History:  Diagnosis Date  . Anemia   . Hypertension   . ICH (intracerebral hemorrhage) (HCC) 02/2018    Past Surgical History:  Procedure Laterality Date  . MYOMECTOMY      There were no vitals filed for this visit.  Subjective Assessment - 04/06/18 1026    Subjective   Patient indicated exercises are going well at home.      Patient is accompained by:  Family member    Pertinent History  R BG hemorrhage with edema and midline shift 02/13/2018    Patient Stated Goals  I want to be able to walk on my own without the brace or cane, I want to drive, I want my "normal" back    Currently in Pain?  No/denies    Pain Score  0-No pain                   OT Treatments/Exercises (OP) - 04/06/18 0001      Neurological Re-education Exercises   Other Exercises 1  Neuromuscular reeducation to address shoulder range of motion, and muscle activation around shoulder girdle.  Patient with increased tension in shoulder elevators, and humeral adductors and internal rotators - worked to actively decrease this activation and conversely stimulate the shoulder depressors, abductors and external rotators. Patient with limited ability to grade her  muscle activity especially in elbow and shoulder.  Once muscle is activated, she has difficulty turning it off.  With cueing, and proprioceptive input, able to relax over worked muscles. Worked in closed chain conditions to improve strength and coordiantion of arm and body in prep for reaching    Other Exercises 2  Patient's husband asked if she could use 2 lb weight at home to strengthen her arm.  Discouraged use of weight at this time due to muscle imbalance.  Worked on distal aspect of left UE - forearm, wrist and hand.  Patient has improving functional grip strength - can now open a 2 liter bottle with two hands, and early in hand manipulation skills.               OT Education - 04/06/18 1316    Education provided  Yes    Education Details  Refrain from use of weight to strengthen arm at this time.      Person(s) Educated  Patient;Spouse    Methods  Explanation    Comprehension  Verbalized understanding       OT Short Term Goals - 04/06/18 1319      OT SHORT TERM GOAL #1   Title  Pt and husband will be  mod I with HEP for LUE for AROM/strength, and positioning. - 04/10/2018    Status  Achieved      OT SHORT TERM GOAL #2   Title  Pt will be supervision for bathing    Status  Achieved      OT SHORT TERM GOAL #3   Title  Pt will be mod I with dressing, AE prn    Status  On-going      OT SHORT TERM GOAL #4   Title  Pt will be mod I with toilet transfers    Status  Achieved      OT SHORT TERM GOAL #5   Title  Pt will be mod I with splint wear and care, prn    Status  On-going        OT Long Term Goals - 04/04/18 1257      OT LONG TERM GOAL #1   Title  Pt will be mod I for LUE HEP for AROM/strength/functional use - 05/08/2018    Status  On-going      OT LONG TERM GOAL #2   Title  Pt will be mod I with bathing    Status  On-going      OT LONG TERM GOAL #3   Title  Pt will be mod I with shower transfers    Status  On-going      OT LONG TERM GOAL #4   Title  Pt will  be min a for simple hot meal prep    Status  On-going      OT LONG TERM GOAL #5   Title  Pt will be mod  I with loading clothes washer and dryer and folding clothes    Status  On-going      OT LONG TERM GOAL #6   Title  Pt will verbalize understanding of driving evaluation information    Status  On-going      OT LONG TERM GOAL #7   Title  Pt will demonstrate ability to use LUE for mid reach into kitchen cabinet to obtain ingredients when cooking    Status  On-going            Plan - 04/06/18 1317    Clinical Impression Statement  Patient continues to show excellent progress,a nd is using left hand functionally for ADL's routinely.      Occupational Profile and client history currently impacting functional performance  HTN, HLD, anemia, chronic bursitis, +tobacco use,     Occupational performance deficits (Please refer to evaluation for details):  ADL's;IADL's;Work;Leisure;Social Participation    Rehab Potential  Good    OT Frequency  2x / week    OT Duration  8 weeks    OT Treatment/Interventions  Self-care/ADL training;Aquatic Therapy;Cryotherapy;Moist Heat;Electrical Stimulation;DME and/or AE instruction;Neuromuscular education;Therapeutic exercise;Functional Mobility Training;Manual Therapy;Passive range of motion;Splinting;Therapeutic activities;Balance training;Patient/family education    Plan  Need distal work - has putty - teach putty exercises, and FM Coord exercises, NMR for mid level or supportive high reach, coordination body and arm    Consulted and Agree with Plan of Care  Patient    Family Member Consulted  husband- Ronelle       Patient will benefit from skilled therapeutic intervention in order to improve the following deficits and impairments:  Abnormal gait, Decreased balance, Decreased coordination, Decreased mobility, Decreased range of motion, Difficulty walking, Decreased strength, Impaired UE functional use, Impaired tone, Impaired sensation  Visit  Diagnosis: Hemiplegia and hemiparesis following nontraumatic intracerebral hemorrhage  affecting left non-dominant side (HCC)  Abnormal posture  Muscle weakness (generalized)  Unsteadiness on feet  Other symptoms and signs involving the nervous system    Problem List Patient Active Problem List   Diagnosis Date Noted  . Slow transit constipation   . Greater trochanteric bursitis of left hip   . Acute blood loss anemia   . Flaccid monoplegia of upper extremity (HCC)   . Hyperlipemia 02/16/2018  . Cerebral edema (HCC) 02/16/2018  . Benign essential HTN   . Anemia of chronic disease   . Amphetamine abuse (HCC)   . Hypokalemia   . 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 Pneumonia, community acquired 09/23/2015  . Microcytic hypochromic anemia 09/23/2015  . Tobacco abuse 09/23/2015  . Pneumonia 09/23/2015  . Nicotine addiction 12/09/2013  . Microcytic anemia 12/09/2013  . HTN (hypertension) 12/09/2013  . BMI 36.0-36.9,adult 12/09/2013    Collier Salina, OTR/L 04/06/2018, 1:20 PM  [facility] [address] [place], [place], [address] Phone: [phone]   Fax:  [phone]  Name: Jocelyn [name] MRN: [id number] Date of Birth: 1966/04/16

## 2018-04-07 ENCOUNTER — Telehealth: Payer: Self-pay | Admitting: Physical Medicine & Rehabilitation

## 2018-04-07 MED ORDER — AMLODIPINE BESYLATE 10 MG PO TABS: 10.0000 mg | ORAL_TABLET | Freq: Every day | ORAL | 0 refills | Status: AC

## 2018-04-07 MED ORDER — LISINOPRIL 20 MG PO TABS: 20.0000 mg | ORAL_TABLET | Freq: Two times a day (BID) | ORAL | 0 refills | Status: AC

## 2018-04-07 MED ORDER — FLUOXETINE HCL 10 MG PO CAPS: 10.0000 mg | ORAL_CAPSULE | Freq: Every day | ORAL | 0 refills | Status: AC

## 2018-04-07 NOTE — Telephone Encounter (Signed)
Patient is requesting a refill on Norvasc, Prozac and Lisinopril.  Please call patient when this is complete.

## 2018-04-07 NOTE — Telephone Encounter (Signed)
Spoke to pt to let her know that her PCP needs to continue refilling these medications. She states that she just established with a PCP and does not have an appt until 04/13/18. Let pt know that we would fill one more time but future refills needs to go to PCP.

## 2018-04-10 ENCOUNTER — Ambulatory Visit: Payer: 59 | Admitting: Rehabilitation

## 2018-04-10 ENCOUNTER — Ambulatory Visit: Payer: 59 | Admitting: Occupational Therapy

## 2018-04-12 ENCOUNTER — Encounter: Payer: Self-pay | Admitting: Occupational Therapy

## 2018-04-12 ENCOUNTER — Encounter: Payer: Self-pay | Admitting: Physical Therapy

## 2018-04-12 ENCOUNTER — Ambulatory Visit: Payer: 59 | Attending: Physical Medicine & Rehabilitation | Admitting: Physical Therapy

## 2018-04-12 ENCOUNTER — Ambulatory Visit: Payer: 59 | Admitting: Occupational Therapy

## 2018-04-12 DIAGNOSIS — I69154 Hemiplegia and hemiparesis following nontraumatic intracerebral hemorrhage affecting left non-dominant side: Secondary | ICD-10-CM | POA: Diagnosis not present

## 2018-04-12 DIAGNOSIS — R2681 Unsteadiness on feet: Secondary | ICD-10-CM

## 2018-04-12 DIAGNOSIS — R29818 Other symptoms and signs involving the nervous system: Secondary | ICD-10-CM | POA: Diagnosis present

## 2018-04-12 DIAGNOSIS — R293 Abnormal posture: Secondary | ICD-10-CM | POA: Insufficient documentation

## 2018-04-12 DIAGNOSIS — R2689 Other abnormalities of gait and mobility: Secondary | ICD-10-CM | POA: Insufficient documentation

## 2018-04-12 DIAGNOSIS — I69252 Hemiplegia and hemiparesis following other nontraumatic intracranial hemorrhage affecting left dominant side: Secondary | ICD-10-CM | POA: Diagnosis present

## 2018-04-12 DIAGNOSIS — M6281 Muscle weakness (generalized): Secondary | ICD-10-CM

## 2018-04-12 NOTE — Therapy (Signed)
[facility] [address] [place], [place], [address] Phone: [phone]   Fax:  [phone]  Occupational Therapy Treatment  Patient Details  Name: Jocelyn [name] MRN: [id number] Date of Birth: 1966-04-26 Referring Provider: Dr. Alysia Penna   Encounter Date: 04/12/2018  OT End of Session - 04/12/18 1122    Visit Number  6    Number of Visits  16    Date for OT Re-Evaluation  05/08/18    Authorization Type  [employer]  - 100 visit limit combined for PT and OT    OT Start Time  1015    OT Stop Time  1103    OT Time Calculation (min)  48 min    Activity Tolerance  Patient tolerated treatment well    Behavior During Therapy  [facility] for tasks assessed/performed       Past Medical History:  Diagnosis Date  . Anemia   . Hypertension   . ICH (intracerebral hemorrhage) ([place]) 02/2018    Past Surgical History:  Procedure Laterality Date  . MYOMECTOMY      There were no vitals filed for this visit.  Subjective Assessment - 04/12/18 1021    Subjective   I was able to pick up my 23 old niece from her car seat with both hands!  (15 lbs)      Patient is accompained by:  Family member    Pertinent History  R BG hemorrhage with edema and midline shift 02/13/2018    Patient Stated Goals  I want to be able to walk on my own without the brace or cane, I want to drive, I want my "normal" back    Currently in Pain?  No/denies    Pain Score  0-No pain                   OT Treatments/Exercises (OP) - 04/12/18 1115      ADLs   Driving  Patient has attempted driving with her husband.  Spoke with patient about the importance of an MD clearing her to drive.  Discussed that there were no obvious impairments that would keep patient from driving.  Reviewed graduated driving program.  Patient and husband showed understanding.      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  No lifting, few stairs, and she has the option to work remotely.  Discussed the benefit of starting slowly when returning to work due to fatigue.  Discussed when she's ready to start to consider 3-4 hours/day, or even less than 5 days/ week.  Patient is currently on Short Term disability, and feels her employer will be happy to work with her to accomodate a smooth transition.        Exercises   Exercises  Theraputty      Hand Exercises   Other Hand Exercises  Patient issued therapy putty in the hospital, but has not been using it.  Issued both red and yellow putty, and taught patient and husband left hand strengthening exercises.  See patient instructions.        Theraputty   Theraputty - Flatten  yellow    Theraputty - Roll  red    Theraputty - Grip  red    Theraputty - Pinch  red    Theraputty Hand- Locate Pegs  yellow      Neurological Re-education Exercises   Other  Exercises 1  Neuromuscular reeducation to address timing - coordination in all joints of LUE, and also bilateral coordination to toss and catch a ball.               OT Education - 04/12/18 1122    Education provided  Yes    Education Details  Theraputty, driving options    Person(s) Educated  Patient;Spouse    Methods  Explanation;Demonstration    Comprehension  Verbalized understanding;Returned demonstration       OT Short Term Goals - 04/12/18 1124      OT SHORT TERM GOAL #1   Title  Pt and husband will be mod I with HEP for LUE for AROM/strength, and positioning. - 04/10/2018    Status  Achieved      OT SHORT TERM GOAL #2   Title  Pt will be supervision for bathing    Status  Achieved      OT SHORT TERM GOAL #3   Title  Pt will be mod I with dressing, AE prn    Status  Achieved      OT SHORT TERM GOAL #4   Title  Pt will be mod I with toilet transfers    Status  Achieved      OT SHORT TERM GOAL #5   Title  Pt will be mod I with splint wear and care, prn     Status  Deferred has not yet needed a splint      OT SHORT TERM GOAL #6   Title  Pt will demonstrate the ability for bilateral low reach to pick up a light weight object    Status  Achieved      OT SHORT TERM GOAL #7   Title  Pt will demonstrate the ability for grasp and release for light small object    Status  Achieved        OT Long Term Goals - 04/12/18 1124      OT LONG TERM GOAL #1   Title  Pt will be mod I for LUE HEP for AROM/strength/functional use - 05/08/2018    Status  On-going      OT LONG TERM GOAL #2   Title  Pt will be mod I with bathing    Status  Achieved      OT LONG TERM GOAL #3   Title  Pt will be mod I with shower transfers    Status  Achieved      OT LONG TERM GOAL #4   Title  Pt will be min a for simple hot meal prep    Status  On-going      OT LONG TERM GOAL #5   Title  Pt will be mod  I with loading clothes washer and dryer and folding clothes    Status  Achieved      OT LONG TERM GOAL #6   Title  Pt will verbalize understanding of driving evaluation information    Status  Achieved      OT LONG TERM GOAL #7   Title  Pt will demonstrate ability to use LUE for mid reach into kitchen cabinet to obtain ingredients when cooking    Status  On-going            Plan - 04/12/18 1122    Clinical Impression Statement  Patient is continuing to show excellent progress, and has met all of her short term goals, and even some of her long term goals.  Patient hopes to be able to retunr to work.      Occupational Profile and client history currently impacting functional performance  HTN, HLD, anemia, chronic bursitis, +tobacco use,     Occupational performance deficits (Please refer to evaluation for details):  ADL's;IADL's;Work;Leisure;Social Participation    Rehab Potential  Good    OT Frequency  2x / week    OT Duration  8 weeks    OT Treatment/Interventions  Self-care/ADL training;Aquatic Therapy;Cryotherapy;Moist Heat;Electrical Stimulation;DME  and/or AE instruction;Neuromuscular education;Therapeutic exercise;Functional Mobility Training;Manual Therapy;Passive range of motion;Splinting;Therapeutic activities;Balance training;Patient/family education    Plan  NMR LUE and trunk- mid to high reach closed to open chain, typing, discuss neuro appt from 6/6    Consulted and Agree with Plan of Care  Patient    Family Member Consulted  husband- [place]       Patient will benefit from skilled therapeutic intervention in order to improve the following deficits and impairments:  Abnormal gait, Decreased balance, Decreased coordination, Decreased mobility, Decreased range of motion, Difficulty walking, Decreased strength, Impaired UE functional use, Impaired tone, Impaired sensation  Visit Diagnosis: 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 Slow transit constipation   . Greater trochanteric bursitis of left hip   . Acute blood loss anemia   . Flaccid monoplegia of upper extremity ([place])   . Hyperlipemia 02/16/2018  . Cerebral edema ([place]) 02/16/2018  . Benign essential HTN   . Anemia of chronic disease   . Amphetamine abuse ([place])   . Hypokalemia   . ICH (intracerebral hemorrhage) ([place]) - R basal ganglia 02/13/2018  . Pneumonia, community acquired 09/23/2015  . Microcytic hypochromic anemia 09/23/2015  . Tobacco abuse 09/23/2015  . Pneumonia 09/23/2015  . Nicotine addiction 12/09/2013  . Microcytic anemia 12/09/2013  . HTN (hypertension) 12/09/2013  . BMI 36.0-36.9,adult 12/09/2013    Mariah Milling, OTR/L 04/12/2018, 11:28 AM  [facility] [address] [place], [place], [address] Phone:  [phone]   Fax:  [phone]  Name: Jocelyn [name] MRN: [id number] Date of Birth: June 19, 1966

## 2018-04-12 NOTE — Patient Instructions (Signed)
Please keep your elbow on the table to reduce your big arm muscles from working during these exercises.    1. Grip Strengthening (Resistive Putty)   Squeeze putty using thumb and all fingers. yellow Repeat 15 times. Do 1 sessions per day.   Extension (Assistive Putty)   Roll putty back and forth, being sure to use all fingertips. Red Repeat 3 times. Do 1 sessions per day.  Then pinch as below.   Palmar Pinch Strengthening (Resistive Putty)   Pinch putty between thumb and each fingertip in turn after rolling out Red  Finger and Thumb Extension (Resistive Putty)   With thumb and all fingers in center of putty donut, stretch out.Yellow Repeat 5-10 times. Do 1 sessions per day.      IP Fisting (Resistive Putty)   Keeping knuckles straight, bend fingertips to squeeze putty. Red Repeat 10 times. Do 1 sessions per day.  MP Flexion (Resistive Putty)   Bending only at large knuckles, press putty down against thumb. Keep fingertips straight. Red Repeat 10 times. Do 1 sessions per day.

## 2018-04-13 ENCOUNTER — Ambulatory Visit: Payer: 59 | Admitting: Adult Health

## 2018-04-13 ENCOUNTER — Encounter: Payer: Self-pay | Admitting: Adult Health

## 2018-04-13 VITALS — BP 119/71 | HR 71 | Ht 67.0 in | Wt 168.2 lb

## 2018-04-13 DIAGNOSIS — E785 Hyperlipidemia, unspecified: Secondary | ICD-10-CM

## 2018-04-13 DIAGNOSIS — I610 Nontraumatic intracerebral hemorrhage in hemisphere, subcortical: Secondary | ICD-10-CM | POA: Diagnosis not present

## 2018-04-13 DIAGNOSIS — I10 Essential (primary) hypertension: Secondary | ICD-10-CM

## 2018-04-13 NOTE — Therapy (Addendum)
[facility] [address] [place], [place], [address] Phone: [phone]   Fax:  [phone]  Physical Therapy Treatment  Patient Details  Name: Jocelyn [name] MRN: [id number] Date of Birth: [birth year] Referring Provider: Dr. Alysia Penna   Encounter Date: 04/12/2018     04/12/18 0937  PT Visits / Re-Eval  Visit Number 7  Number of Visits 17  Date for PT Re-Evaluation 05/13/18  Authorization  Authorization Type [employer] - VL: 100 combined PT, OT, ST  Authorization - Visit Number 7  Authorization - Number of Visits 50  PT Time Calculation  PT Start Time 0935  PT Stop Time 1015  PT Time Calculation (min) 40 min  PT - End of Session  Equipment Utilized During Treatment Gait belt (min guard to S)  Activity Tolerance Patient tolerated treatment well  Behavior During Therapy [facility] for tasks assessed/performed    Past Medical History:  Diagnosis Date  . Anemia   . Hypertension   . ICH (intracerebral hemorrhage) ([place]) 02/2018    Past Surgical History:  Procedure Laterality Date  . MYOMECTOMY      There were no vitals filed for this visit.      04/12/18 0936  Symptoms/Limitations  Subjective No new complaints. Forgot about the pillow, did get the ball. No falls or pain to report .  Pertinent History HTN, tobacco use, anemia, bursitis of L hip  Limitations Walking  How long can you stand comfortably? short duration  How long can you walk comfortably? household distances  Patient Stated Goals to be able to walk without any type of AD or AFO and to drive again  Pain Assessment  Currently in Pain? No/denies  Pain Score 0      04/12/18 1011  Transfers  Transfers Sit to Stand;Stand to Sit  Sit to Stand 5: Supervision;Without upper extremity assist;From chair/3-in-1  Stand to Sit 5: Supervision;With upper extremity assist;To chair/3-in-1  Ambulation/Gait  Ambulation/Gait Yes  Ambulation/Gait Assistance  5: Supervision  Ambulation/Gait Assistance Details concurrent with Bioness to left LE. cues to relax left UE, for weight shifting and equal step length.   Ambulation Distance (Feet) 230 Feet (x1, plus around gym)  Assistive device None;Other (Comment)  Gait Pattern Step-through pattern;Lateral trunk lean to right;Left flexed knee in stance;Decreased hip/knee flexion - left;Decreased dorsiflexion - left;Decreased step length - left  Ambulation Surface Level;Indoor  Stairs Yes  Stairs Assistance 5: Supervision  Stairs Assistance Details (indicate cue type and reason) cues on weight shifting with reciprocal pattern, and for increased left foot clearance with use of Bioness.  Stair Management Technique One rail Right;Alternating pattern;Forwards  Number of Stairs 4 (x2 reps)  Neuro Re-ed   Neuro Re-ed Details  concurrent with Bioness to left LE for strengthening/muscle re-ed:  in training mode- wall slided for 5 sec holds, rest for 8 seconds x 10 reps with chair in front/box beneath her for safety; at stairs: in right stance- left taps up/down bottom 3 stairs during on time of 7 seconds, rest between reps for 10 seconds x 10 reps with emphasis on hip/knee flexion/DF; left stance with right foot taps up/down bottom 3 steps with emphasis on knee stability in stance for 10 reps.                     Programme researcher, broadcasting/film/video Location L lower leg, L post thigh  Electrical Stimulation Action for improved DF with swing phase and improved knee control in  stance phase  Electrical Stimulation Parameters refer to table 2 for adjusted parameters; quick fit electrodes  Electrical Stimulation Goals Strength;Neuromuscular facilitation       04/12/18 0942  Standardized Balance Assessment  Standardized Balance Assessment Five Times Sit to Stand;10 meter walk test  Five times sit to stand comments  13.10 no UE support, standard height surface, equal LE weight bearing  10 Meter Walk 12.94  sec's= 2.53 ft/sec no AD, no othotics except for heel lift on left        PT Short Term Goals - 04/12/18 0937      PT SHORT TERM GOAL #1   Title  Pt will participate in further balance assessment with BERG balance assessment    Baseline  45/56 on 03/14/18    Status  Achieved      PT SHORT TERM GOAL #2   Title  Pt will participate in initial set up of functional electrical stimulation for LLE    Baseline  04/12/18: met before today    Status  Achieved      PT SHORT TERM GOAL #3   Title  Pt will improve LE strength and WB/activation of LLE with five time sit to stand decrease to <20 seconds (more equal WB through R and LLE during sit <> stand)    Baseline  04/12/18; 13.10 sec's with arms across chest, standard seat height with equal LE weight bearing    Time  --    Period  --    Status  Achieved      PT SHORT TERM GOAL #4   Title  Pt will improve gait velocity with RW and AFO to >1.0 ft/sec    Baseline  04/12/18: 2.53 ft/sec with no brace (heel wedge only) and no AD    Time  --    Period  --    Status  Achieved      PT SHORT TERM GOAL #5   Title  Pt will initiate gait training without RW (with cane or no AD) on indoor surfaces with min A    Baseline  04/12/18: met before today    Status  Achieved        PT Long Term Goals - 03/14/18 0942      PT LONG TERM GOAL #1   Title  Pt will perform HEP with supervision    Time  8    Period  Weeks    Status  New    Target Date  05/13/18      PT LONG TERM GOAL #2   Title  Pt will improve overall function to >/= 60% on FOTO    Baseline  40%    Time  8    Period  Weeks    Status  New    Target Date  05/13/18      PT LONG TERM GOAL #3   Title  Pt will improve balance, decrease falls risk as indicated by increase in BERG score by 8 points    Baseline  TBD    Time  8    Period  Weeks    Status  New    Target Date  05/13/18      PT LONG TERM GOAL #4   Title  Pt will improve gait velocity to >/= 1.8 ft/sec with most appropriate AFO  and LRAD    Time  8    Period  Weeks    Status  New    Target Date  05/13/18  PT LONG TERM GOAL #5   Title  Pt will improve five time sit to stand to </= 16 seconds with equal WB through R/LLE    Time  8    Period  Weeks    Status  New    Target Date  05/13/18      Additional Long Term Goals   Additional Long Term Goals  Yes      PT LONG TERM GOAL #6   Title  Pt will ambulate x 230' indoors and 250' outside over curbs and uneven pavement with LRAD/AFO and supervision    Time  8    Period  Weeks    Status  New    Target Date  05/13/18       04/12/18 1610  Plan  Clinical Impression Statement Today's skilled session focused on checking remaining STGs with both goals met. Pt has also met her LTGs for 10 meter and 5 time sit to stand. Remainder of session continued with use of Bioness to left LE with gait and strengthening. Pt is making steady progress toward goals and should benefit from continued PT to progress toward unmet goals.   Pt will benefit from skilled therapeutic intervention in order to improve on the following deficits Abnormal gait;Decreased balance;Decreased strength;Difficulty walking;Postural dysfunction;Impaired sensation;Pain  Rehab Potential Good  PT Frequency 2x / week  PT Duration 8 weeks  PT Treatment/Interventions ADLs/Self Care Home Management;Electrical Stimulation;DME Instruction;Gait training;Stair training;Functional mobility training;Therapeutic activities;Therapeutic exercise;Balance training;Neuromuscular re-education;Patient/family education;Orthotic Fit/Training;Passive range of motion;Taping  PT Next Visit Plan  Continue NMR with Bioness, continue gait without device (cane outdoors?), with [place] PLS brace and heel wedge,   NMR for trunk and LLE.  Sit <> Stand with more equal WB through R/LLE  Consulted and Agree with Plan of Care Patient;Family member/caregiver  Family Member Consulted husband           Patient will benefit from  skilled therapeutic intervention in order to improve the following deficits and impairments:  Abnormal gait, Decreased balance, Decreased strength, Difficulty walking, Postural dysfunction, Impaired sensation, Pain  Visit Diagnosis: Hemiplegia and hemiparesis following nontraumatic intracerebral hemorrhage affecting left non-dominant side (HCC)  Muscle weakness (generalized)  Unsteadiness on feet  Other abnormalities of gait and mobility  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 Slow transit constipation   . Greater trochanteric bursitis of left hip   . Acute blood loss anemia   . 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 Hyperlipemia 02/16/2018  . Cerebral edema ([place]) 02/16/2018  . Benign essential HTN   . Anemia of chronic disease   . Amphetamine abuse ([place])   . Hypokalemia   . 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 Pneumonia, community acquired 09/23/2015  . Microcytic hypochromic anemia 09/23/2015  . Tobacco abuse 09/23/2015  . Pneumonia 09/23/2015  . Nicotine addiction 12/09/2013  . Microcytic anemia 12/09/2013  . HTN (hypertension) 12/09/2013  . BMI 36.0-36.9,adult 12/09/2013    Willow Ora, PTA, [facility] [address], [place], [place] [address] [phone] 04/14/18, 7:51 AM   Name: Jocelyn [name] MRN: [id number] Date of Birth: Apr 07, 1966

## 2018-04-13 NOTE — Patient Instructions (Addendum)
Continue lipitor  for secondary stroke prevention  Continue to follow up with PCP regarding cholesterol and blood pressure management   Continue to monitor blood pressure at home  Continue physical and occupational therapy  Maintain strict control of hypertension with blood pressure goal below 130/90, diabetes with hemoglobin A1c goal below 6.5% and cholesterol with LDL cholesterol (bad cholesterol) goal below 70 mg/dL. I also advised the patient to eat a healthy diet with plenty of whole grains, cereals, fruits and vegetables, exercise regularly and maintain ideal body weight.  Followup in the future with me in 3 months or call earlier if needed         Thank you [facility]. I hope we have been able to provide you high quality care today.  You may receive a patient satisfaction survey over the next few weeks. We would appreciate your feedback and comments so that we may continue to improve ourselves and the health of our patients.

## 2018-04-13 NOTE — Progress Notes (Signed)
[facility] [address] [place]. [place] [address] [phone]       OFFICE FOLLOW UP NOTE  [name] Date of Birth:  1966-09-04 Medical Record Number:  [id number]   Reason for Referral:  hospital ICH follow up  CHIEF COMPLAINT:  Chief Complaint  Patient presents with  . New Patient (Initial Visit)    hospital follow up. stroke.     HPI: Jocelyn [name] is being seen today for initial visit in the office for ICH on 02/13/18. History obtained from patient and chart review. Reviewed all radiology images and labs personally.  Jocelyn R Williamsis a 52 y.o.femalewith a history of hypertension and anemiawho lives alone and apparently was last known normal at approximately 6:30 PM on Sunday evening 02/12/2018. A friend called her the morning of 02/13/18 however, the patient did not answer the phone and her friend came to her house. When she arrived she found the patient sitting on the floor next to her bed unable to get up. EMS was called and the patient was brought to [facility] as a code stroke. In route EMS noted left sided weakness, left facial droop, and slurred speech. The patient was noted to be oriented. On arrival to the emergency department the patient's exam was much the same. NIH Stroke Scale was 9.A stat CT scan of the head was performed that revealed an intracerebral hemorrhage with 4 mm midline shift. The patient states she was not taking any medications prior to admission. She was admitted to the neuro ICU for further evaluation and treatment.   CTA of the head and neck were unremarkable.  Repeat CT scan on 02/14/2018 showed a stable hematoma.  Additional repeat CT scan on 02/16/2018 showed stable hematoma without increase in hemorrhage but did show expected mild increase in surrounding edema.  2D echo showed an EF of 60 to 65% without cardiac source of embolus.  LDL 115 and as patient was not on statin PTA it was recommended to start  Lipitor 20 mg daily.  A1c satisfactory at 5.3.  No antithrombotic PTA and continue this through discharge due to ICH.  Patient was discharged to CIR in stable condition.  Patient is being seen today for hospital follow-up and is accompanied by her husband.  Patient has been participating in our neuro rehab clinic for ongoing PT/OT therapy and has been making great improvement.  Continues to have slight left arm weakness but denies numbness, tingling or pain.  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  She has not started driving at this time but per OT, they believe she is safe to start driving at this time.  Continues to take Lipitor without side effects of myalgias.  Blood pressure today satisfactory 119/81.  Denies new or worsening stroke/TIA symptoms.   ROS:   14 system review of systems performed and negative with exception of no complaints  PMH:  Past Medical History:  Diagnosis Date  . Anemia   . Hypertension   . ICH (intracerebral hemorrhage) (HCC) 02/2018    PSH:  Past Surgical History:  Procedure Laterality Date  . MYOMECTOMY      Social History:  Social History   Socioeconomic History  . Marital status: Married    Spouse name: Not on file  . Number of children: Not on file  . Years of education: Not on file  . Highest education level:  Not on file  Occupational History  . Not on file  Social Needs  . Financial resource strain: Not on file  . Food insecurity:    Worry: Not on file    Inability: Not on file  . Transportation needs:    Medical: Not on file    Non-medical: Not on file  Tobacco Use  . Smoking status: Current Every Day Smoker    Packs/day: 0.20    Years: 5.00    Pack years: 1.00    Types: Cigarettes  . Smokeless tobacco: Never Used  Substance and Sexual Activity  . Alcohol use: Yes    Alcohol/week: 0.0 oz    Comment: occasionally  . Drug use: No  . Sexual activity: Yes    Birth  control/protection: Abstinence  Lifestyle  . Physical activity:    Days per week: Not on file    Minutes per session: Not on file  . Stress: Not on file  Relationships  . Social connections:    Talks on phone: Not on file    Gets together: Not on file    Attends religious service: Not on file    Active member of club or organization: Not on file    Attends meetings of clubs or organizations: Not on file    Relationship status: Not on file  . Intimate partner violence:    Fear of current or ex partner: Not on file    Emotionally abused: Not on file    Physically abused: Not on file    Forced sexual activity: Not on file  Other Topics Concern  . Not on file  Social History Narrative  . Not on file    Family History:  Family History  Problem Relation Age of Onset  . Diabetes Mellitus II Neg Hx   . Hypertension Neg Hx     Medications:   Current Outpatient Medications on File Prior to Visit  Medication Sig Dispense Refill  . acetaminophen (TYLENOL) 325 MG tablet Take 1-2 tablets (325-650 mg total) by mouth every 4 (four) hours as needed for mild pain.    Marland Kitchen amLODipine (NORVASC) 10 MG tablet Take 1 tablet (10 mg total) by mouth daily. 30 tablet 0  . atorvastatin (LIPITOR) 20 MG tablet TK 1 T PO QPM  1  . cyanocobalamin 1000 MCG tablet Take 1 tablet (1,000 mcg total) by mouth daily. 30 tablet 0  . diclofenac sodium (VOLTAREN) 1 % GEL Apply 2 g topically 4 (four) times daily. 4 Tube 0  . ferrous sulfate 325 (65 FE) MG tablet Take 1 tablet (325 mg total) by mouth 3 (three) times daily with meals. 90 tablet 0  . FLUoxetine (PROZAC) 10 MG capsule Take 1 capsule (10 mg total) by mouth daily. 30 capsule 0  . lisinopril (PRINIVIL,ZESTRIL) 20 MG tablet Take 1 tablet (20 mg total) by mouth 2 (two) times daily. 60 tablet 0   No current facility-administered medications on file prior to visit.     Allergies:  No Known Allergies   Physical Exam  Vitals:   04/13/18 1033  BP: 119/71    Pulse: 71  Weight: 168 lb 3.2 oz (76.3 kg)  Height: 5\' 7"  (1.702 m)   Body mass index is 26.34 kg/m. No exam data present  General: well developed, pleasant middle-aged African-American female, well nourished, seated, in no evident distress Head: head normocephalic and atraumatic.   Neck: supple with no carotid or supraclavicular bruits Cardiovascular: regular rate and rhythm, no murmurs Musculoskeletal:  no deformity Skin:  no rash/petichiae Vascular:  Normal pulses all extremities  Neurologic Exam Mental Status: Awake and fully alert. Oriented to place and time. Recent and remote memory intact. Attention span, concentration and fund of knowledge appropriate. Mood and affect appropriate.  Cranial Nerves: Fundoscopic exam reveals sharp disc margins. Pupils equal, briskly reactive to light. Extraocular movements full without nystagmus. Visual fields full to confrontation. Hearing intact. Facial sensation intact. Face, tongue, palate moves normally and symmetrically.  Motor: Normal bulk and tone. Normal strength in all tested extremity muscles.  4+/5 LUE; 5/5 strength in all other extremities tested Sensory.: intact to touch , pinprick , position and vibratory sensation.  Coordination: Rapid alternating movements normal in all extremities. Finger-to-nose and heel-to-shin performed accurately bilaterally.  Decreased finger dexterity in left fingers; orbits right arm over left arm Gait and Station: Arises from chair without difficulty. Stance is normal. Gait demonstrates normal stride length and balance . Able to heel, toe and tandem walk without difficulty.  Reflexes: 1+ and symmetric. Toes downgoing.    NIHSS  0 Modified Rankin  1    Diagnostic Data (Labs, Imaging, Testing)  CT HEAD WO CONTRAST 02/13/2018 IMPRESSION: Acute right basal ganglia hemorrhage with mild edema and 4 mm of leftward midline shift.  CT HEAD WO CONTRAST (REPEAT) 02/14/2018 IMPRESSION: 1. Patent carotid and  vertebral arteries. No dissection, aneurysm, or hemodynamically significant stenosis utilizing NASCET criteria. 2. Patent anterior and posterior intracranial circulation. No large vessel occlusion, aneurysm, or significant stenosis. No vascular malformation. 3. Stable hematoma in right basal ganglia measuring up to 3.7 cm. Stable associated edema and mass effect with 4 mm right-to-left midline shift. No new acute intracranial abnormality identified.  CT ANGIO NECK W OR WO CONTRAST CT ANGIO HEAD W OR WO CONTRAST 02/14/2018 IMPRESSION: 1. Patent carotid and vertebral arteries. No dissection, aneurysm, or hemodynamically significant stenosis utilizing NASCET criteria. 2. Patent anterior and posterior intracranial circulation. No large vessel occlusion, aneurysm, or significant stenosis. No vascular malformation. 3. Stable hematoma in right basal ganglia measuring up to 3.7 cm. Stable associated edema and mass effect with 4 mm right-to-left midline shift. No new acute intracranial abnormality identified.  CT HEAD WO CONTRAST(REPEAT) 02/16/2018 IMPRESSION: No increased bleeding in the right lateral basal ganglia/external capsule region. Maximal hematoma dimension is 3.8 cm. Slightly more surrounding vasogenic edema with right to left shift of 5 mm.  ECHOCARDIOGRAM 02/14/2018 Impressions: - LVEF 60-65%, normal wall thickness, normal wall motion, normal   diastolic function, aortic valve sclerosis, normal LA size, mild   RAE, normal IVC.    ASSESSMENT: Jocelyn [name] is a 52 y.o. 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 Vascular risk factors include HTN and HLD.     PLAN: -Continue  lipitor  for secondary stroke prevention and cholesterol control -No antithrombotic needed at this time as ICH was due to hypertension and blood pressure has been maintained. Patient does not have a history of CAD -F/u with PCP regarding your HLD and HTN  management -continue to monitor BP at home -Continue PT/OT and advised patient that if she needs additional orders to contact us -Clear to start driving at this time as patient has minimal deficits and was cleared by OT.  Advised patient has been that she should only be driving with him in the passenger seat the first few times and if he feels as though it is safe for her to continue driving, she should drive only place that she  is familiar with and to stay off main highways and roads.  Patient and husband verbalized understanding. -Maintain strict control of hypertension with blood pressure goal below 130/90, diabetes with hemoglobin A1c goal below 6.5% and cholesterol with LDL cholesterol (bad cholesterol) goal below 70 mg/dL. I also advised the patient to eat a healthy diet with plenty of whole grains, cereals, fruits and vegetables, exercise regularly and maintain ideal body weight.  Follow up in 3 months or call earlier if needed   Greater than 50% of time during this 25 minute visit was spent on counseling,explanation of diagnosis of ICH, reviewing risk factor management of HLD and HTN, planning of further management, discussion with patient and family and coordination of care    George HughJessica Ely Ballen, [facility] RidgeGNP-BC  [facility] [address] [place], [place] [id number]  Phone [phone] Fax [phone]

## 2018-04-14 ENCOUNTER — Ambulatory Visit: Payer: 59 | Admitting: Rehabilitation

## 2018-04-14 ENCOUNTER — Ambulatory Visit: Payer: 59 | Admitting: Occupational Therapy

## 2018-04-14 ENCOUNTER — Encounter: Payer: Self-pay | Admitting: Occupational Therapy

## 2018-04-14 ENCOUNTER — Encounter: Payer: Self-pay | Admitting: Rehabilitation

## 2018-04-14 DIAGNOSIS — M6281 Muscle weakness (generalized): Secondary | ICD-10-CM

## 2018-04-14 DIAGNOSIS — I69154 Hemiplegia and hemiparesis following nontraumatic intracerebral hemorrhage affecting left non-dominant side: Secondary | ICD-10-CM

## 2018-04-14 DIAGNOSIS — R2681 Unsteadiness on feet: Secondary | ICD-10-CM

## 2018-04-14 DIAGNOSIS — R293 Abnormal posture: Secondary | ICD-10-CM

## 2018-04-14 DIAGNOSIS — R2689 Other abnormalities of gait and mobility: Secondary | ICD-10-CM

## 2018-04-14 DIAGNOSIS — R29818 Other symptoms and signs involving the nervous system: Secondary | ICD-10-CM

## 2018-04-14 NOTE — Therapy (Signed)
[facility] [address] [place], [place], [address] Phone: [phone]   Fax:  [phone]  Occupational Therapy Treatment  Patient Details  Name: Jocelyn [name] MRN: [id number] Date of Birth: [birth year] Referring Provider: Dr. Claudette Laws   Encounter Date: 04/14/2018  OT End of Session - 04/14/18 1327    Visit Number  7    Number of Visits  16    Date for OT Re-Evaluation  05/08/18    Authorization Type  [employer]  - 100 visit limit combined for PT and OT    OT Start Time  1232    OT Stop Time  1315    OT Time Calculation (min)  43 min       Past Medical History:  Diagnosis Date  . Anemia   . Hypertension   . ICH (intracerebral hemorrhage) (HCC) 02/2018    Past Surgical History:  Procedure Laterality Date  . MYOMECTOMY      There were no vitals filed for this visit.  Subjective Assessment - 04/14/18 1234    Subjective   I am driving myself!    Pertinent History  R BG hemorrhage with edema and midline shift 02/13/2018    Patient Stated Goals  I want to be able to walk on my own without the brace or cane, I want to drive, I want my "normal" back    Currently in Pain?  No/denies    Pain Score  0-No pain                   OT Treatments/Exercises (OP) - 04/14/18 0001      Neurological Re-education Exercises   Other Exercises 1  Neuromuscular reeducation to address mid to high level reach with left shoulder.  Patient has limited shoulder flexion, abduction end range.  She has discomfort with attempts at active high reach due to proximal weakness and improper mechanics.  Worked on body on arm motion to increase proximal stability and to improve interlimb coordination.      Other Exercises 2  Patient attempted typing at home as instructed, and felt it was very challenging.  She was unable to effectively manipulate left hand to hit accurate keys.  Worked on in hand manipulation with cueing to reduce  shoulder activation for distal function.               OT Education - 04/14/18 1324    Education provided  Yes    Education Details  reduce shoulder and trunk motion for distal function    Person(s) Educated  Patient    Methods  Explanation;Demonstration    Comprehension  Verbalized understanding;Returned demonstration       OT Short Term Goals - 04/12/18 1124      OT SHORT TERM GOAL #1   Title  Pt and husband will be mod I with HEP for LUE for AROM/strength, and positioning. - 04/10/2018    Status  Achieved      OT SHORT TERM GOAL #2   Title  Pt will be supervision for bathing    Status  Achieved      OT SHORT TERM GOAL #3   Title  Pt will be mod I with dressing, AE prn    Status  Achieved      OT SHORT TERM GOAL #4   Title  Pt will be mod I with toilet transfers    Status  Achieved      OT SHORT TERM  GOAL #5   Title  Pt will be mod I with splint wear and care, prn    Status  Deferred has not yet needed a splint      OT SHORT TERM GOAL #6   Title  Pt will demonstrate the ability for bilateral low reach to pick up a light weight object    Status  Achieved      OT SHORT TERM GOAL #7   Title  Pt will demonstrate the ability for grasp and release for light small object    Status  Achieved        OT Long Term Goals - 04/12/18 1124      OT LONG TERM GOAL #1   Title  Pt will be mod I for LUE HEP for AROM/strength/functional use - 05/08/2018    Status  On-going      OT LONG TERM GOAL #2   Title  Pt will be mod I with bathing    Status  Achieved      OT LONG TERM GOAL #3   Title  Pt will be mod I with shower transfers    Status  Achieved      OT LONG TERM GOAL #4   Title  Pt will be min a for simple hot meal prep    Status  On-going      OT LONG TERM GOAL #5   Title  Pt will be mod  I with loading clothes washer and dryer and folding clothes    Status  Achieved      OT LONG TERM GOAL #6   Title  Pt will verbalize understanding of driving evaluation  information    Status  Achieved      OT LONG TERM GOAL #7   Title  Pt will demonstrate ability to use LUE for mid reach into kitchen cabinet to obtain ingredients when cooking    Status  On-going            Plan - 04/14/18 1325    Clinical Impression Statement  Patient continues to make steady progress in her ADL/IADL due to improving functional mobility, balance, and use of left hand.  Patient has returned to driving.      Occupational Profile and client history currently impacting functional performance  HTN, HLD, anemia, chronic bursitis, +tobacco use,     Occupational performance deficits (Please refer to evaluation for details):  ADL's;IADL's;Work;Leisure;Social Participation    Rehab Potential  Good    OT Frequency  2x / week    OT Duration  8 weeks    OT Treatment/Interventions  Self-care/ADL training;Aquatic Therapy;Cryotherapy;Moist Heat;Electrical Stimulation;DME and/or AE instruction;Neuromuscular education;Therapeutic exercise;Functional Mobility Training;Manual Therapy;Passive range of motion;Splinting;Therapeutic activities;Balance training;Patient/family education    Plan  NMR LUE and trunk- mid to high reach closed to open chain, interlimb coordination - in hand manipulation    Consulted and Agree with Plan of Care  Patient       Patient will benefit from skilled therapeutic intervention in order to improve the following deficits and impairments:  Abnormal gait, Decreased balance, Decreased coordination, Decreased mobility, Decreased range of motion, Difficulty walking, Decreased strength, Impaired UE functional use, Impaired tone, Impaired sensation  Visit Diagnosis: 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  Slow transit constipation   . Greater trochanteric bursitis of left hip   . Acute blood loss anemia   . Flaccid monoplegia of upper extremity (HCC)   . Hyperlipemia 02/16/2018  . Cerebral edema (HCC) 02/16/2018  . Benign essential HTN   . Anemia of chronic disease   . Amphetamine abuse (HCC)   . Hypokalemia   . 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 Pneumonia, community acquired 09/23/2015  . Microcytic hypochromic anemia 09/23/2015  . Tobacco abuse 09/23/2015  . Pneumonia 09/23/2015  . Nicotine addiction 12/09/2013  . Microcytic anemia 12/09/2013  . HTN (hypertension) 12/09/2013  . BMI 36.0-36.9,adult 12/09/2013    Collier Salina, OTR/L 04/14/2018, 1:28 PM  [facility] [address] [place], [place], [address] Phone: [phone]   Fax:  [phone]  Name: Jocelyn [name] MRN: [id number] Date of Birth: [birth year]

## 2018-04-14 NOTE — Progress Notes (Signed)
I agree with the above plan 

## 2018-04-14 NOTE — Patient Instructions (Signed)
      Do in gym with yoga mat and weight bench to the side of you for support as needed.  Start in standing, go down to right knee, bring left knee down and then bring left leg up, and return to stand.  Alternate which leg you go down with and which leg you come up on.  Do 3 reps on each side.

## 2018-04-15 NOTE — Therapy (Signed)
[facility] [address] [place], [place], [address] Phone: [phone]   Fax:  [phone]  Physical Therapy Treatment  Patient Details  Name: Jocelyn [name] MRN: [id number] Date of Birth: Oct 21, 1966 Referring Provider: Dr. Alysia Penna   Encounter Date: 04/14/2018  PT End of Session - 04/14/18 1319    Visit Number  8    Number of Visits  17    Date for PT Re-Evaluation  05/13/18    Authorization Type  [employer] - VL: 100 combined PT, OT, ST    Authorization - Visit Number  8    Authorization - Number of Visits  50    PT Start Time  4128    PT Stop Time  1400    PT Time Calculation (min)  45 min    Equipment Utilized During Treatment  Gait belt min guard to S    Activity Tolerance  Patient tolerated treatment well    Behavior During Therapy  [facility] for tasks assessed/performed       Past Medical History:  Diagnosis Date  . Anemia   . Hypertension   . ICH (intracerebral hemorrhage) ([place]) 02/2018    Past Surgical History:  Procedure Laterality Date  . MYOMECTOMY      There were no vitals filed for this visit.  Subjective Assessment - 04/14/18 1318    Subjective  Pt drove to therapy today!!    Pertinent History  HTN, tobacco use, anemia, bursitis of L hip    Limitations  Walking    How long can you stand comfortably?  short duration    How long can you walk comfortably?  household distances    Patient Stated Goals  to be able to walk without any type of AD or AFO and to drive again    Currently in Pain?  No/denies         [facility] PT Assessment - 04/14/18 0001      Functional Gait  Assessment   Gait assessed   Yes    Gait Level Surface  Walks 20 ft in less than 5.5 sec, no assistive devices, good speed, no evidence for imbalance, normal gait pattern, deviates no more than 6 in outside of the 12 in walkway width.    Change in Gait Speed  Able to smoothly change walking speed without loss of balance or  gait deviation. Deviate no more than 6 in outside of the 12 in walkway width.    Gait with Horizontal Head Turns  Performs head turns smoothly with slight change in gait velocity (eg, minor disruption to smooth gait path), deviates 6-10 in outside 12 in walkway width, or uses an assistive device.    Gait with Vertical Head Turns  Performs task with slight change in gait velocity (eg, minor disruption to smooth gait path), deviates 6 - 10 in outside 12 in walkway width or uses assistive device    Gait and Pivot Turn  Pivot turns safely within 3 sec and stops quickly with no loss of balance.    Step Over Obstacle  Is able to step over 2 stacked shoe boxes taped together (9 in total height) without changing gait speed. No evidence of imbalance.    Gait with Narrow Base of Support  Is able to ambulate for 10 steps heel to toe with no staggering.    Gait with Eyes Closed  Walks 20 ft, uses assistive device, slower speed, mild gait deviations, deviates 6-10 in outside 12  in walkway width. Ambulates 20 ft in less than 9 sec but greater than 7 sec.    Ambulating Backwards  Walks 20 ft, uses assistive device, slower speed, mild gait deviations, deviates 6-10 in outside 12 in walkway width.    Steps  Alternating feet, no rail.    Total Score  26                   [facility] Adult PT Treatment/Exercise - 04/14/18 1322      Ambulation/Gait   Ambulation/Gait  Yes    Ambulation/Gait Assistance  6: Modified independent (Device/Increase time)    Ambulation/Gait Assistance Details  Pt with questions regarding return to gym to walk on treadmill.  Assessed safety with this during session.  She was able to ambulate on treadmill at speed up to 2.0 mph without incline and with UE support at mod I level during session.  Pt was given single cue to listen for L foot drag (very mild) and once cued she was able to correct.  Educated to begin with 5-8 mins, take a break and do a few LE machines/other exercise and then  return for another 5-8 mins.  Also eduated to work up time before adding incline.  Pt verbalized understanding.      Ambulation Distance (Feet)  -- 5 mins on treadmill    Assistive device  -- treadmill    Gait Pattern  Step-through pattern;Lateral trunk lean to right;Left flexed knee in stance;Decreased hip/knee flexion - left;Decreased dorsiflexion - left;Decreased step length - left    Ambulation Surface  Level;Indoor    Stairs  Yes    Stairs Assistance  7: Independent    Stair Management Technique  No rails;Alternating pattern;Forwards    Number of Stairs  4    Height of Stairs  6      Standardized Balance Assessment   Standardized Balance Assessment  Berg Balance Test      Berg Balance Test   Sit to Stand  Able to stand without using hands and stabilize independently    Standing Unsupported  Able to stand safely 2 minutes    Sitting with Back Unsupported but Feet Supported on Floor or Stool  Able to sit safely and securely 2 minutes    Stand to Sit  Sits safely with minimal use of hands    Transfers  Able to transfer safely, minor use of hands    Standing Unsupported with Eyes Closed  Able to stand 10 seconds safely    Standing Ubsupported with Feet Together  Able to place feet together independently and stand 1 minute safely    From Standing, Reach Forward with Outstretched Arm  Can reach confidently >25 cm (10")    From Standing Position, Pick up Object from Floor  Able to pick up shoe safely and easily    From Standing Position, Turn to Look Behind Over each Shoulder  Looks behind from both sides and weight shifts well    Turn 360 Degrees  Able to turn 360 degrees safely in 4 seconds or less    Standing Unsupported, Alternately Place Feet on Step/Stool  Able to stand independently and safely and complete 8 steps in 20 seconds    Standing Unsupported, One Foot in Front  Able to place foot tandem independently and hold 30 seconds    Standing on One Leg  Able to lift leg independently  and hold > 10 seconds    Total Score  56  Self-Care   Self-Care  Other Self-Care Comments    Other Self-Care Comments   Went over previous LTGs with pt and updated during session to add what pts goals are for end of therapy.  Added FGA goal for balance, return to gym, and pt being able to wear high heels.  Pt making excellent progress and feel that she may not need another 4 weeks of PT before D/C.       Neuro Re-ed    Neuro Re-ed Details   Performed BLE leg press at 80lbs (varied from 60-80lbs) with emphasis on L knee control when in extension.  Performed several reps in this manner and then switched to LLE only with 40lbs x 10 reps, again for L knee control.  Pt reports that L knee still "snaps" sometimes when fatigued.  Provided education that this should be her indication to stop and rest.  Pt verbalized understanding.  Ended session with stand<>tall kneeling transitions.  Pt with increased difficulty with this task, therefore added to HEP for her to do in gym (at her appt complex).  See pt instruction.              PT Education - 04/15/18 0723    Education provided  Yes    Education Details  See self care, pt to bring heels to next session, additional HEP exercise.     Person(s) Educated  Patient    Methods  Explanation;Demonstration;Handout    Comprehension  Verbalized understanding;Returned demonstration       PT Short Term Goals - 04/12/18 0937      PT SHORT TERM GOAL #1   Title  Pt will participate in further balance assessment with BERG balance assessment    Baseline  45/56 on 03/14/18    Status  Achieved      PT SHORT TERM GOAL #2   Title  Pt will participate in initial set up of functional electrical stimulation for LLE    Baseline  04/12/18: met before today    Status  Achieved      PT SHORT TERM GOAL #3   Title  Pt will improve LE strength and WB/activation of LLE with five time sit to stand decrease to <20 seconds (more equal WB through R and LLE during sit <>  stand)    Baseline  04/12/18; 13.10 sec's with arms across chest, standard seat height with equal LE weight bearing    Time  --    Period  --    Status  Achieved      PT SHORT TERM GOAL #4   Title  Pt will improve gait velocity with RW and AFO to >1.0 ft/sec    Baseline  04/12/18: 2.53 ft/sec with no brace (heel wedge only) and no AD    Time  --    Period  --    Status  Achieved      PT SHORT TERM GOAL #5   Title  Pt will initiate gait training without RW (with cane or no AD) on indoor surfaces with min A    Baseline  04/12/18: met before today    Status  Achieved        PT Long Term Goals - 04/14/18 1333      PT LONG TERM GOAL #1   Title  Pt will perform HEP independently  (all LTGs due 05/13/18)    Time  8    Period  Weeks    Status  Revised  PT LONG TERM GOAL #2   Title  Pt will improve overall function to >/= 60% on FOTO    Baseline  40%    Time  8    Period  Weeks    Status  On-going      PT LONG TERM GOAL #3   Title  Pt will improve balance, decrease falls risk as indicated by increase in BERG score by 8 points    Baseline  deferred due to progress    Time  8    Period  Weeks    Status  Deferred      PT LONG TERM GOAL #4   Title  Pt will improve gait velocity to >/= 3.00 ft/sec with most appropriate AFO and LRAD    Baseline  04/12/18:  2.53 ft/sec with no AD with just heel wedge to left shoue    Status  Revised      PT LONG TERM GOAL #5   Title  Pt will improve five time sit to stand to </= 16 seconds with equal WB through R/LLE    Baseline  04/12/18: 13.10 sec's with no UE support and equal weight bearing    Status  Achieved      Additional Long Term Goals   Additional Long Term Goals  Yes      PT LONG TERM GOAL #6   Title  Pt will ambulate >1000' without device, without AFO indpendently (up/down curbs, grassy, hills) in order to return to community and leisure activity.     Time  8    Period  Weeks    Status  Revised      PT LONG TERM GOAL #7   Title  Pt  will improve FGA to >/=28/30 in order to indicate decreased fall risk.     Time  8    Period  Weeks    Status  New      PT LONG TERM GOAL #8   Title  Pt will tolerate wearing 2-3" heels safely without overt LOB over level surfaces at independent level in order to return to leisure activity.     Time  8    Period  Weeks    Status  New      PT LONG TERM GOAL  #9   TITLE  Pt will report return to gym activity in order to continue gains made in therapy.     Time  8    Period  Weeks    Status  New            Plan - 04/14/18 1320    Clinical Impression Statement  Session addressed updating LTGs due to excellent amount of progress being made with all aspects of mobility.  Pt has scored 56/56 on BERG and therefore assessed FGA with score of 26/30.  Pt also wanting to return to gym and be able to wear heels.  Will update goals to reflect these.      Rehab Potential  Good    PT Frequency  2x / week    PT Duration  8 weeks    PT Treatment/Interventions  ADLs/Self Care Home Management;Electrical Stimulation;DME Instruction;Gait training;Stair training;Functional mobility training;Therapeutic activities;Therapeutic exercise;Balance training;Neuromuscular re-education;Patient/family education;Orthotic Fit/Training;Passive range of motion;Taping    PT Next Visit Plan  gait training in heels, treadmill, outdoor gait (make sure she is ok without any bracing, esp on incline/grass,etc), stand<>tall kneel transitions.     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 Slow transit constipation   . Greater trochanteric bursitis of left hip   . Acute blood loss anemia   . Flaccid monoplegia of upper extremity ([place])   . Hyperlipemia 02/16/2018  . Cerebral edema ([place]) 02/16/2018  . Benign essential HTN   . Anemia of chronic disease   . Amphetamine abuse ([place])   . Hypokalemia   . ICH (intracerebral hemorrhage) ([place]) - R basal ganglia 02/13/2018  . Pneumonia, community acquired 09/23/2015  . Microcytic hypochromic anemia 09/23/2015  . Tobacco abuse 09/23/2015  . Pneumonia 09/23/2015  . Nicotine addiction 12/09/2013  . Microcytic anemia 12/09/2013  . HTN (hypertension) 12/09/2013  . BMI 36.0-36.9,adult 12/09/2013    Cameron Sprang, PT, MPT [facility] [address] [place], [place], [address] Phone: [phone]   Fax:  [phone] 04/15/18, 7:31 AM  Name: Jocelyn [name] MRN: [id number] Date of Birth: [birth year]

## 2018-04-17 ENCOUNTER — Ambulatory Visit: Payer: 59 | Admitting: Occupational Therapy

## 2018-04-17 ENCOUNTER — Ambulatory Visit: Payer: 59 | Admitting: Rehabilitation

## 2018-04-19 ENCOUNTER — Ambulatory Visit: Payer: 59 | Admitting: Physical Therapy

## 2018-04-19 ENCOUNTER — Encounter: Payer: Self-pay | Admitting: Physical Therapy

## 2018-04-19 ENCOUNTER — Encounter: Payer: Self-pay | Admitting: Occupational Therapy

## 2018-04-19 ENCOUNTER — Ambulatory Visit: Payer: 59 | Admitting: Occupational Therapy

## 2018-04-19 DIAGNOSIS — I69154 Hemiplegia and hemiparesis following nontraumatic intracerebral hemorrhage affecting left non-dominant side: Secondary | ICD-10-CM

## 2018-04-19 DIAGNOSIS — R2689 Other abnormalities of gait and mobility: Secondary | ICD-10-CM

## 2018-04-19 DIAGNOSIS — R293 Abnormal posture: Secondary | ICD-10-CM

## 2018-04-19 DIAGNOSIS — R29818 Other symptoms and signs involving the nervous system: Secondary | ICD-10-CM

## 2018-04-19 DIAGNOSIS — R2681 Unsteadiness on feet: Secondary | ICD-10-CM

## 2018-04-19 DIAGNOSIS — I69252 Hemiplegia and hemiparesis following other nontraumatic intracranial hemorrhage affecting left dominant side: Secondary | ICD-10-CM

## 2018-04-19 DIAGNOSIS — M6281 Muscle weakness (generalized): Secondary | ICD-10-CM

## 2018-04-19 NOTE — Therapy (Signed)
[facility] [address] [place], [place], [address] Phone: [phone]   Fax:  [phone]  Physical Therapy Treatment  Patient Details  Name: Jocelyn [name] MRN: [id number] Date of Birth: December 27, 1965 Referring Provider: Dr. Alysia Penna   Encounter Date: 04/19/2018  PT End of Session - 04/19/18 1209    Visit Number  9    Number of Visits  17    Date for PT Re-Evaluation  05/13/18    Authorization Type  [employer] - VL: 100 combined PT, OT, ST    Authorization - Visit Number  9    Authorization - Number of Visits  50    PT Start Time  1100    PT Stop Time  1145    PT Time Calculation (min)  45 min    Equipment Utilized During Treatment  Gait belt min guard to S    Activity Tolerance  Patient tolerated treatment well    Behavior During Therapy  [facility] for tasks assessed/performed       Past Medical History:  Diagnosis Date  . Anemia   . Hypertension   . ICH (intracerebral hemorrhage) ([place]) 02/2018    Past Surgical History:  Procedure Laterality Date  . MYOMECTOMY      There were no vitals filed for this visit.  Subjective Assessment - 04/19/18 1059    Subjective  She went out of town over weekend. She drove around town but not on highway.     Pertinent History  HTN, tobacco use, anemia, bursitis of L hip    Limitations  Walking    How long can you stand comfortably?  short duration    How long can you walk comfortably?  household distances    Patient Stated Goals  to be able to walk without any type of AD or AFO and to drive again    Currently in Pain?  No/denies      Gait Training: Pt did not bring heels to PT today but will bring them to next session. Pt ambulated on treadmill 6 min 2.64mh to 2.333m with verbal cues on step length & increasing as speed increases,  BUE support for 4 minutes, then RUE support with PT tactile cues for Left arm swing including scapular motion for remaining 2 min.  Pt did  not appear safe when attempting to change speed or turn on/off while standing/ambulating on belt.  PT set speed of belt at 2.28m16mand pt able to step on/off belt with BUE support safely.   Therapeutic Exercise  See pt education/instructions  Neuromuscular Re-education: 75cm Swiss Ball in corner for safety:  Pelvic tilts ant/post & lateral, hip flexion/marching, knee extension, alternate abduction step, ankle DF alternate LEs, ankle PF alternate LEs and alternate arm swings.                         PT Education - 04/19/18 1136    Education provided  Yes    Education Details  gastroc / hamstring stretch, treadmill use, seated thera Ball (75cm ball) UE & LE exercises,     Person(s) Educated  Patient    Methods  Explanation;Demonstration;Tactile cues;Verbal cues;Handout    Comprehension  Verbalized understanding;Returned demonstration;Verbal cues required;Tactile cues required;Need further instruction       PT Short Term Goals - 04/12/18 [id number]   PT SHORT TERM GOAL #1   Title  Pt will participate in further balance assessment with BERG  balance assessment    Baseline  45/56 on 03/14/18    Status  Achieved      PT SHORT TERM GOAL #2   Title  Pt will participate in initial set up of functional electrical stimulation for LLE    Baseline  04/12/18: met before today    Status  Achieved      PT SHORT TERM GOAL #3   Title  Pt will improve LE strength and WB/activation of LLE with five time sit to stand decrease to <20 seconds (more equal WB through R and LLE during sit <> stand)    Baseline  04/12/18; 13.10 sec's with arms across chest, standard seat height with equal LE weight bearing    Time  --    Period  --    Status  Achieved      PT SHORT TERM GOAL #4   Title  Pt will improve gait velocity with RW and AFO to >1.0 ft/sec    Baseline  04/12/18: 2.53 ft/sec with no brace (heel wedge only) and no AD    Time  --    Period  --    Status  Achieved      PT SHORT TERM GOAL  #5   Title  Pt will initiate gait training without RW (with cane or no AD) on indoor surfaces with min A    Baseline  04/12/18: met before today    Status  Achieved        PT Long Term Goals - 04/19/18 1210      PT LONG TERM GOAL #1   Title  Pt will perform HEP independently  (all LTGs due 05/13/18)    Time  8    Period  Weeks    Status  On-going    Target Date  05/13/18      PT LONG TERM GOAL #2   Title  Pt will improve overall function to >/= 60% on FOTO    Baseline  40%    Time  8    Period  Weeks    Status  On-going    Target Date  05/13/18      PT LONG TERM GOAL #3   Title  Pt will improve balance, decrease falls risk as indicated by increase in BERG score by 8 points    Baseline  MET 04/14/2018   Jocelyn [name] 56/56    Time  8    Period  Weeks    Status  Achieved      PT LONG TERM GOAL #4   Title  Pt will improve gait velocity to >/= 3.00 ft/sec with most appropriate AFO and LRAD    Baseline  04/12/18:  2.53 ft/sec with no AD with just heel wedge to left shoue    Status  On-going    Target Date  05/13/18      PT LONG TERM GOAL #5   Title  Pt will improve five time sit to stand to </= 16 seconds with equal WB through R/LLE    Baseline  04/12/18: 13.10 sec's with no UE support and equal weight bearing    Status  Achieved      PT LONG TERM GOAL #6   Title  Pt will ambulate >1000' without device, without AFO indpendently (up/down curbs, grassy, hills) in order to return to community and leisure activity.     Time  8    Period  Weeks    Status  On-going    Target  Date  05/13/18      PT LONG TERM GOAL #7   Title  Pt will improve FGA to >/=28/30 in order to indicate decreased fall risk.     Time  8    Period  Weeks    Status  On-going    Target Date  05/13/18      PT LONG TERM GOAL #8   Title  Pt will tolerate wearing 2-3" heels safely without overt LOB over level surfaces at independent level in order to return to leisure activity.     Time  8    Period  Weeks    Status  New     Target Date  05/13/18      PT LONG TERM GOAL  #9   TITLE  Pt will report return to gym activity in order to continue gains made in therapy.     Time  8    Period  Weeks    Status  On-going    Target Date  05/13/18            Plan - 04/19/18 1212    Clinical Impression Statement  Patient improved gait on treadmill with verbal cues for step length with faster speed. She was able to step on/off belt set at 2.72mh but trying to change controls while ambulating on belt appeared unsafe.  Patient appears to have tightness in hamstring & gastroc limiting ability to take longer step for faster gait. She appears to understand HEP for stretches.      Rehab Potential  Good    PT Frequency  2x / week    PT Duration  8 weeks    PT Treatment/Interventions  ADLs/Self Care Home Management;Electrical Stimulation;DME Instruction;Gait training;Stair training;Functional mobility training;Therapeutic activities;Therapeutic exercise;Balance training;Neuromuscular re-education;Patient/family education;Orthotic Fit/Training;Passive range of motion;Taping    PT Next Visit Plan  gait training in heels (PT requested she bring to PT sessions), treadmill, outdoor gait (make sure she is ok without any bracing, esp on incline/grass,etc), stand<>tall kneel transitions.     Consulted and Agree with Plan of Care  Patient       Patient will benefit from skilled therapeutic intervention in order to improve the following deficits and impairments:  Abnormal gait, Decreased balance, Decreased strength, Difficulty walking, Postural dysfunction, Impaired sensation, Pain  Visit Diagnosis: Hemiplegia and hemiparesis following nontraumatic intracerebral hemorrhage affecting left non-dominant side (HCC)  Abnormal posture  Muscle weakness (generalized)  Unsteadiness on feet  Other symptoms and signs involving the nervous system  Other abnormalities of gait and mobility     Problem List Patient Active Problem List    Diagnosis Date Noted  . Slow transit constipation   . Greater trochanteric bursitis of left hip   . Acute blood loss anemia   . 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 Hyperlipemia 02/16/2018  . Cerebral edema ([place] 02/16/2018  . Benign essential HTN   . Anemia of chronic disease   . [place]mp[place] abuse ([place]   . Hypokalemia   . [place] - R basal ganglia 02/13/2018  . Pneumonia, community acquired 09/23/2015  . Microcytic hypochromic anemia 09/23/2015  . Tobacco abuse 09/23/2015  . Pneumonia 09/23/2015  . Nicotine addiction 12/09/2013  . Microcytic anemia 12/09/2013  . HTN (hypertension) 12/09/2013  . BMI 36.0-36.9,adult 12/09/2013    Jocelyn [name] PT, DPT 04/19/2018, 12:19 PM  [place] [address]Phone: [phone]  Fax:  [phone]  Name: Jocelyn [name] MRN: [id number] Date of Birth: 05-26-1966

## 2018-04-19 NOTE — Patient Instructions (Addendum)
Access Code: [email or web address]  URL: [email or web address]  Date: 04/19/2018  Prepared by: Vladimir Fasterobin Yanisa Goodgame   Exercises Seated Hamstring Stretch - 3 reps - 1 sets - 30 seconds hold - 2x daily - 7x weekly Seated Gastroc Stretch with Strap - 3 reps - 1 sets - 30 seconds hold - 2x daily - 7x weekly Seated Hamstring Stretch with Strap - 3 reps - 1 sets - 30 seconds hold - 2x daily - 7x weekly Pelvic Tilt on Swiss Ball - 10 reps - 1 sets - 2 hold - 1x daily - 3x weekly Seated Swiss Ball Lateral Pelvic Tilts - 10 reps - 3 sets - 1x daily - 3x weekly Seated Swiss Ball Pelvic Circles - 10 reps - 1 sets - 2 hold - 1x daily - 7x weekly Swiss Ball March - 10 reps - 1 sets - 2 hold - 1x daily - 3x weekly Swiss Ball Knee Extension - 10 reps - 1 sets - 3 hold - 1x daily - 3x weekly

## 2018-04-19 NOTE — Therapy (Signed)
[facility] [address] [place], [place], [address] Phone: [phone]   Fax:  [phone]  Occupational Therapy Treatment  Patient Details  Name: Jocelyn [name] MRN: [id number] Date of Birth: 26-Jul-1966 Referring Provider: Dr. Claudette LawsAndrew Kirsteins   Encounter Date: 04/19/2018  OT End of Session - 04/19/18 1315    Visit Number  8    Number of Visits  16    Date for OT Re-Evaluation  05/08/18    Authorization Type  [employer]  - 100 visit limit combined for PT and OT    OT Start Time  1147    OT Stop Time  1231    OT Time Calculation (min)  44 min    Activity Tolerance  Patient tolerated treatment well       Past Medical History:  Diagnosis Date  . Anemia   . Hypertension   . ICH (intracerebral hemorrhage) (HCC) 02/2018    Past Surgical History:  Procedure Laterality Date  . MYOMECTOMY      There were no vitals filed for this visit.  Subjective Assessment - 04/19/18 1154    Subjective   I don't have pain right now but when I wake up in the morning it hurts like an achie pain.    Patient is accompained by:  Family member    Pertinent History  R BG hemorrhage with edema and midline shift 02/13/2018    Patient Stated Goals  I want to be able to walk on my own without the brace or cane, I want to drive, I want my "normal" back                   OT Treatments/Exercises (OP) - 04/19/18 0001      ADLs   ADL Comments  Education regarding anatomy and biomechanics of shoulder girdle relative to pain and resticted movement.  Pt verbalized understanding.   Also provided education on bed positioning as pt report that she wakes up with pain in L shoulder.  Addressed supine, R and L side sleeping. Pt returned demonstrated and verbalized understanding.       Neurological Re-education Exercises   Other Exercises 1  Neuro re ed to address scapular alignment and stability in sidelying, prone and supine progressing to  scapular stable mobility. Pt able to achieve increased pain free shoulder flexion in supine.  Transitioned into sitting and addressed bilateral overhead reach in closed chain with scapular facilitation for normal movement. Progressed to unilateral overhead reach with LUE for simple reaching task.               OT Education - 04/19/18 1314    Education provided  Yes    Education Details  bed positioning, biomechanics of shoulder girdle.    Person(s) Educated  Patient    Methods  Explanation;Demonstration    Comprehension  Verbalized understanding;Returned demonstration       OT Short Term Goals - 04/19/18 1314      OT SHORT TERM GOAL #1   Title  Pt and husband will be mod I with HEP for LUE for AROM/strength, and positioning. - 04/10/2018    Status  Achieved      OT SHORT TERM GOAL #2   Title  Pt will be supervision for bathing    Status  Achieved      OT SHORT TERM GOAL #3   Title  Pt will be mod I with dressing, AE prn    Status  Achieved      OT SHORT TERM GOAL #4   Title  Pt will be mod I with toilet transfers    Status  Achieved      OT SHORT TERM GOAL #5   Title  Pt will be mod I with splint wear and care, prn    Status  Deferred has not yet needed a splint      OT SHORT TERM GOAL #6   Title  Pt will demonstrate the ability for bilateral low reach to pick up a light weight object    Status  Achieved      OT SHORT TERM GOAL #7   Title  Pt will demonstrate the ability for grasp and release for light small object    Status  Achieved        OT Long Term Goals - 04/19/18 1314      OT LONG TERM GOAL #1   Title  Pt will be mod I for LUE HEP for AROM/strength/functional use - 05/08/2018    Status  On-going      OT LONG TERM GOAL #2   Title  Pt will be mod I with bathing    Status  Achieved      OT LONG TERM GOAL #3   Title  Pt will be mod I with shower transfers    Status  Achieved      OT LONG TERM GOAL #4   Title  Pt will be min a for simple hot meal prep     Status  Achieved      OT LONG TERM GOAL #5   Title  Pt will be mod  I with loading clothes washer and dryer and folding clothes    Status  Achieved      OT LONG TERM GOAL #6   Title  Pt will verbalize understanding of driving evaluation information    Status  Achieved      OT LONG TERM GOAL #7   Title  Pt will demonstrate ability to use LUE for mid reach into kitchen cabinet to obtain ingredients when cooking    Status  On-going            Plan - 04/19/18 1315    Clinical Impression Statement  Pt making good progress toward goals. Pt with improving LUE functional overhead reach    Occupational Profile and client history currently impacting functional performance  HTN, HLD, anemia, chronic bursitis, +tobacco use,     Occupational performance deficits (Please refer to evaluation for details):  ADL's;IADL's;Work;Leisure;Social Participation    Rehab Potential  Good    OT Frequency  2x / week    OT Duration  8 weeks    OT Treatment/Interventions  Self-care/ADL training;Aquatic Therapy;Cryotherapy;Moist Heat;Electrical Stimulation;DME and/or AE instruction;Neuromuscular education;Therapeutic exercise;Functional Mobility Training;Manual Therapy;Passive range of motion;Splinting;Therapeutic activities;Balance training;Patient/family education    Plan  NMR LUE and trunk- mid to high reach closed to open chain, interlimb coordination - in hand manipulation    Consulted and Agree with Plan of Care  Patient       Patient will benefit from skilled therapeutic intervention in order to improve the following deficits and impairments:  Abnormal gait, Decreased balance, Decreased coordination, Decreased mobility, Decreased range of motion, Difficulty walking, Decreased strength, Impaired UE functional use, Impaired tone, Impaired sensation  Visit Diagnosis: Abnormal posture  Muscle weakness (generalized)  Unsteadiness on feet  Other abnormalities of gait and mobility  Other symptoms  and signs involving the nervous  system  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 Slow transit constipation   . Greater trochanteric bursitis of left hip   . Acute blood loss anemia   . Flaccid monoplegia of upper extremity (HCC)   . Hyperlipemia 02/16/2018  . Cerebral edema (HCC) 02/16/2018  . Benign essential HTN   . Anemia of chronic disease   . Amphetamine abuse (HCC)   . Hypokalemia   . 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 Pneumonia, community acquired 09/23/2015  . Microcytic hypochromic anemia 09/23/2015  . Tobacco abuse 09/23/2015  . Pneumonia 09/23/2015  . Nicotine addiction 12/09/2013  . Microcytic anemia 12/09/2013  . HTN (hypertension) 12/09/2013  . BMI 36.0-36.9,adult 12/09/2013    Norton Pastel, OTR/L 04/19/2018, 1:18 PM  [facility] [address] [place], [place], [address] Phone: [phone]   Fax:  [phone]  Name: Jocelyn [name] MRN: [id number] Date of Birth: Apr 02, 1966

## 2018-04-21 ENCOUNTER — Encounter: Payer: 59 | Attending: Physical Medicine & Rehabilitation

## 2018-04-21 ENCOUNTER — Encounter: Payer: Self-pay | Admitting: Physical Medicine & Rehabilitation

## 2018-04-21 ENCOUNTER — Ambulatory Visit: Payer: 59 | Admitting: Physical Medicine & Rehabilitation

## 2018-04-21 VITALS — BP 114/78 | HR 83 | Ht 67.0 in | Wt 170.0 lb

## 2018-04-21 DIAGNOSIS — F17210 Nicotine dependence, cigarettes, uncomplicated: Secondary | ICD-10-CM | POA: Diagnosis not present

## 2018-04-21 DIAGNOSIS — I69398 Other sequelae of cerebral infarction: Secondary | ICD-10-CM | POA: Diagnosis present

## 2018-04-21 DIAGNOSIS — I69154 Hemiplegia and hemiparesis following nontraumatic intracerebral hemorrhage affecting left non-dominant side: Secondary | ICD-10-CM

## 2018-04-21 DIAGNOSIS — Z79899 Other long term (current) drug therapy: Secondary | ICD-10-CM | POA: Insufficient documentation

## 2018-04-21 DIAGNOSIS — I10 Essential (primary) hypertension: Secondary | ICD-10-CM | POA: Diagnosis not present

## 2018-04-21 DIAGNOSIS — R269 Unspecified abnormalities of gait and mobility: Secondary | ICD-10-CM | POA: Diagnosis not present

## 2018-04-21 NOTE — Patient Instructions (Signed)
No return to work yet will reassess at next visit

## 2018-04-21 NOTE — Progress Notes (Signed)
Subjective:    Patient ID: Jocelyn [name], female    DOB: 09/13/1966, 52 y.o.   MRN: [id number] 52 year old female with history of untreated hypertension, tobacco use, anemia; who was admitted on 02/13/2018 after being found by family with left-sided weakness, left facial droop and slurred speech.  CT of head done revealing right basal ganglia hemorrhage with mild edema and leftward shift.  Blood pressure was elevated at 148/101 and she was started on clevidipine drip as well as hypertonic saline for edema control.  CT head neck done revealing patent carotid and vertebral artery, no large vessel occlusion, aneurysm or AVM.  She was was weaned off hypertonic saline and started on Norvasc for BP control but blood pressures continue to be labile.  Patient with resultant aphasia left-sided weakness and cognitive deficits  Admit date: 02/17/2018 Discharge date: 03/09/2018  HPI CC left hand weakness Still receiving OP PT and OT Pt released to drive by neuro. 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 General activity 0 Relation with others 0 Enjoyment of life 0 What TIME of day is your pain at its worst? na Sleep (in general) Fair  Pain is worse with: na Pain improves with: na Relief from Meds: na  Mobility walk without assistance ability to climb steps?  yes do you drive?  yes  Function employed # of hrs/week FMLA  Neuro/Psych numbness tingling  Prior Studies Any changes since last visit?  no  Physicians involved in your care Any changes since last visit?  no   Family History  Problem Relation Age of Onset  . Diabetes Mellitus II Neg Hx   . Hypertension Neg Hx    Social History   Socioeconomic History  . Marital status: Married    Spouse name: Not on file  . Number of children: Not on file  .  Years of education: Not on file  . Highest education level: Not on file  Occupational History  . Not on file  Social Needs  . Financial resource strain: Not on file  . Food insecurity:    Worry: Not on file    Inability: Not on file  . Transportation needs:    Medical: Not on file    Non-medical: Not on file  Tobacco Use  . Smoking status: Current Every Day Smoker    Packs/day: 0.20    Years: 5.00    Pack years: 1.00    Types: Cigarettes  . Smokeless tobacco: Never Used  Substance and Sexual Activity  . Alcohol use: Yes    Alcohol/week: 0.0 oz    Comment: occasionally  . Drug use: No  . Sexual activity: Yes    Birth control/protection: Abstinence  Lifestyle  . Physical activity:    Days per week: Not on file    Minutes per session: Not on file  . Stress: Not on file  Relationships  . Social connections:    Talks on phone: Not on file    Gets together: Not on file    Attends religious service: Not on file    Active member of club or organization: Not on file    Attends meetings of clubs or organizations: Not on file    Relationship status: Not on file  Other Topics Concern  . Not on file  Social History Narrative  . Not on file   Past Surgical History:  Procedure Laterality Date  . MYOMECTOMY     Past Medical History:  Diagnosis Date  . Anemia   . Hypertension   . ICH (intracerebral hemorrhage) (HCC) 02/2018   BP 114/78   Pulse 83   Ht 5\' 7"  (1.702 m)   Wt 170 lb (77.1 kg)   LMP 04/18/2018   SpO2 98%   BMI 26.63 kg/m   Opioid Risk Score:   Fall Risk Score:  `1  Depression screen PHQ 2/9  Depression screen [facility] 2/9 03/21/2018 11/05/2015  Decreased Interest 0 0  Down, Depressed, Hopeless 0 0  PHQ - 2 Score 0 0  Altered sleeping 0 -  Tired, decreased energy 0 -  Change in appetite 0 -  Feeling bad or failure about yourself  0 -  Trouble concentrating 0 -  Moving slowly or fidgety/restless 0 -  Suicidal thoughts 0 -  PHQ-9 Score 0 -  Difficult  doing work/chores Not difficult at all -     Review of Systems  Constitutional: Negative.   HENT: Negative.   Eyes: Negative.   Respiratory: Negative.   Cardiovascular: Negative.   Gastrointestinal: Negative.   Endocrine: Negative.   Genitourinary: Negative.   Musculoskeletal: Negative.   Skin: Negative.   Allergic/Immunologic: Negative.   Neurological: Positive for numbness.  Hematological: Negative.   Psychiatric/Behavioral: Negative.   All other systems reviewed and are negative.      Objective:   Physical Exam  Motor strength is 5/5 in the right deltoid, bicep, tricep, grip, hip flexor, knee extensor, ankle dorsiflexor 4/5 in the left deltoid, bicep, tricep, grip, hip flexor, knee extensor, ankle dorsiflexor Fine motor reduced rapidity with left finger to thumb opposition compared to the right side.  Mild dysdiadochokinesis with rapid alternating movements left side versus right side Mildly reduced sensation but still able to distinguish light touch in the left index and left thumb Gait without evidence of toe drag or knee instability she is able to perform tandem gait although it is slow. Mood and affect are appropriate      Assessment & Plan:  1.  Right basal ganglia hemorrhagic stroke secondary to hypertension.  Making good progress with physical therapy no longer requiring assistive device.  She is independent with all her self-care and mobility.  She still has some residual left upper extremity greater than lower extremity weakness and fine motor's deficits.  For this reason I do not think she is quite ready go back to work yet. We will continue outpatient PT OT for the next month.  Return to clinic in 6 weeks reassess I would think by August 1 she should be able to get back to work on a part-time basis

## 2018-05-01 ENCOUNTER — Ambulatory Visit: Payer: 59 | Admitting: Occupational Therapy

## 2018-05-01 ENCOUNTER — Ambulatory Visit: Payer: 59 | Admitting: Physical Therapy

## 2018-05-04 ENCOUNTER — Ambulatory Visit: Payer: 59 | Admitting: Occupational Therapy

## 2018-05-04 ENCOUNTER — Ambulatory Visit: Payer: 59 | Admitting: Rehabilitation

## 2018-05-08 ENCOUNTER — Ambulatory Visit: Payer: 59 | Attending: Physical Medicine & Rehabilitation | Admitting: Rehabilitation

## 2018-05-08 ENCOUNTER — Encounter: Payer: Self-pay | Admitting: Rehabilitation

## 2018-05-08 ENCOUNTER — Ambulatory Visit: Payer: 59 | Admitting: Occupational Therapy

## 2018-05-08 ENCOUNTER — Encounter: Payer: Self-pay | Admitting: Occupational Therapy

## 2018-05-08 DIAGNOSIS — R293 Abnormal posture: Secondary | ICD-10-CM | POA: Diagnosis present

## 2018-05-08 DIAGNOSIS — I69252 Hemiplegia and hemiparesis following other nontraumatic intracranial hemorrhage affecting left dominant side: Secondary | ICD-10-CM | POA: Insufficient documentation

## 2018-05-08 DIAGNOSIS — R2681 Unsteadiness on feet: Secondary | ICD-10-CM | POA: Insufficient documentation

## 2018-05-08 DIAGNOSIS — R29818 Other symptoms and signs involving the nervous system: Secondary | ICD-10-CM | POA: Insufficient documentation

## 2018-05-08 DIAGNOSIS — R2689 Other abnormalities of gait and mobility: Secondary | ICD-10-CM | POA: Insufficient documentation

## 2018-05-08 DIAGNOSIS — M6281 Muscle weakness (generalized): Secondary | ICD-10-CM | POA: Insufficient documentation

## 2018-05-08 NOTE — Therapy (Signed)
[facility] [address] [place], [place], [address] Phone: [phone]   Fax:  [phone]  Physical Therapy Treatment  Patient Details  Name: Jocelyn [name] MRN: [id number] Date of Birth: 09/07/1966 Referring Provider: Dr. Alysia Penna   Encounter Date: 05/08/2018  PT End of Session - 05/08/18 1106    Visit Number  10    Number of Visits  17    Date for PT Re-Evaluation  05/13/18    Authorization Type  [employer] - VL: 100 combined PT, OT, ST    Authorization - Visit Number  10    Authorization - Number of Visits  50    PT Start Time  1104    PT Stop Time  1147    PT Time Calculation (min)  43 min    Equipment Utilized During Treatment  Gait belt min guard to S    Activity Tolerance  Patient tolerated treatment well    Behavior During Therapy  [facility] for tasks assessed/performed       Past Medical History:  Diagnosis Date  . Anemia   . Hypertension   . ICH (intracerebral hemorrhage) ([place]) 02/2018    Past Surgical History:  Procedure Laterality Date  . MYOMECTOMY      There were no vitals filed for this visit.  Subjective Assessment - 05/08/18 1103    Subjective  Pt reports she was sick last week.  Doing better now, some shoulder pain.     Pertinent History  HTN, tobacco use, anemia, bursitis of L hip    Limitations  Walking    How long can you stand comfortably?  short duration    How long can you walk comfortably?  household distances    Patient Stated Goals  to be able to walk without any type of AD or AFO and to drive again    Currently in Pain?  Yes    Pain Score  2     Pain Location  Shoulder    Pain Orientation  Left    Pain Descriptors / Indicators  Dull    Pain Type  Acute pain    Pain Onset  1 to 4 weeks ago    Pain Frequency  Intermittent    Aggravating Factors   shoulder extension/IR (putting on bra)    Pain Relieving Factors  relax                        [facility] Adult  PT Treatment/Exercise - 05/08/18 1114      Ambulation/Gait   Ambulation/Gait  Yes    Ambulation/Gait Assistance  5: Supervision    Ambulation/Gait Assistance Details  Continue to assess safety with use of treadmill for return ot gym following D/C from therapy.  Pt able to ambulate safely with BUE support at 2.0 mph however still needing to assess her ability to change speeds during task.  Pt still unsteady and unsafe to change speed while ambulating on treadmill, therefore had her attempt to step off each side then adjust speed then step back onto treadmill.  She was able to do this safely during session x 3 reps with BUE support.  Pt ambulated x 6 mins on treadmill.  Also assessed outdoor gait without AD/AFO but with L heel wedge only.  She is able to ambulate at S to mod I level.  S recommended for steeper hills/more uneven surfaces, however she ambulates at mod I level over level grass  and pavement.  Note some very mild L knee recurvatum, buit is not forceful and with min cues she is better able to control.      Ambulation Distance (Feet)  1200 Feet outdoors, plus treadmill    Assistive device  None treadmill    Gait Pattern  Step-through pattern;Lateral trunk lean to right;Left flexed knee in stance;Decreased hip/knee flexion - left;Decreased dorsiflexion - left;Decreased step length - left    Ambulation Surface  Level;Unlevel;Indoor;Outdoor;Paved;Gravel;Grass      Neuro Re-ed    Neuro Re-ed Details   Reviewed current HEP, see pt instruction and note for details.           Feet Together (Compliant Surface) Head Motion - Eyes Closed    Stand on compliant surface: __pillow or foam______ with feet together. Close eyes and move head slowly, right/left, up/down, diagonal up-right/down-left and diagonal up-left/down-right. Repeat __10__ times each direction.   Feet Heel-Toe "Tandem", Head Motion - Eyes Open    With eyes open, right foot directly in front of the other, move head slowly:  right/left, up/down, diagonal up-right/down-left and diagonal up-left/down-right. Repeat __10__ times each direction.    STANDING HEEL RAISES - SINGLE LEG  While standing on one leg, raise up on your toes as you lift your heel off the ground.  Use one hand support on counter or chair.               Do in gym with yoga mat and weight bench to the side of you for support as needed.  Start in standing, go down to right knee, bring left knee down and then bring left leg up, and return to stand.  Alternate which leg you go down with and which leg you come up on.  Do 3 reps on each side.      EOM bridge  Sit at edge of couch or bed (or weight bench in gym), place hands on either side of body. Push through arms and legs, tighten trunk muscles, and lift bottom up from surface. Hold for 2-3 seconds and slowly return to starting position. Do not arch back.  Do 5 reps, rest then do 5 more.  Keep arms tucked in and equal weight bearing between R and L arm.      PT Education - 05/08/18 1935    Education provided  Yes    Education Details  reviewed current HEP, updated slightly, bringing heels to next session, adding another 4 weeks to POC to increase pts function and safety.     Person(s) Educated  Patient    Methods  Explanation;Demonstration;Handout    Comprehension  Verbalized understanding;Returned demonstration       PT Short Term Goals - 04/12/18 0937      PT SHORT TERM GOAL #1   Title  Pt will participate in further balance assessment with BERG balance assessment    Baseline  45/56 on 03/14/18    Status  Achieved      PT SHORT TERM GOAL #2   Title  Pt will participate in initial set up of functional electrical stimulation for LLE    Baseline  04/12/18: met before today    Status  Achieved      PT SHORT TERM GOAL #3   Title  Pt will improve LE strength and WB/activation of LLE with five time sit to stand decrease to <20 seconds (more equal WB through R and LLE during sit  <> stand)    Baseline  04/12/18; 13.10  sec's with arms across chest, standard seat height with equal LE weight bearing    Time  --    Period  --    Status  Achieved      PT SHORT TERM GOAL #4   Title  Pt will improve gait velocity with RW and AFO to >1.0 ft/sec    Baseline  04/12/18: 2.53 ft/sec with no brace (heel wedge only) and no AD    Time  --    Period  --    Status  Achieved      PT SHORT TERM GOAL #5   Title  Pt will initiate gait training without RW (with cane or no AD) on indoor surfaces with min A    Baseline  04/12/18: met before today    Status  Achieved        PT Long Term Goals - 05/08/18 1125      PT LONG TERM GOAL #1   Title  Pt will perform HEP independently  (all LTGs due 05/13/18)    Time  8    Period  Weeks    Status  On-going      PT LONG TERM GOAL #2   Title  Pt will improve overall function to >/= 60% on FOTO    Baseline  40%    Time  8    Period  Weeks    Status  On-going      PT LONG TERM GOAL #3   Title  Pt will improve balance, decrease falls risk as indicated by increase in BERG score by 8 points    Baseline  MET 04/14/2018   Merrilee Jansky 56/56    Time  8    Period  Weeks    Status  Achieved      PT LONG TERM GOAL #4   Title  Pt will improve gait velocity to >/= 3.00 ft/sec with most appropriate AFO and LRAD    Baseline  04/12/18:  2.53 ft/sec with no AD with just heel wedge to left shoue    Status  On-going      PT LONG TERM GOAL #5   Title  Pt will improve five time sit to stand to </= 16 seconds with equal WB through R/LLE    Baseline  04/12/18: 13.10 sec's with no UE support and equal weight bearing    Status  Achieved      PT LONG TERM GOAL #6   Title  Pt will ambulate >1000' without device, without AFO indpendently (up/down curbs, grassy, hills) in order to return to community and leisure activity.     Baseline  05/08/18    Time  8    Period  Weeks    Status  Achieved      PT LONG TERM GOAL #7   Title  Pt will improve FGA to >/=28/30 in order to  indicate decreased fall risk.     Time  8    Period  Weeks    Status  On-going      PT LONG TERM GOAL #8   Title  Pt will tolerate wearing 2-3" heels safely without overt LOB over level surfaces at independent level in order to return to leisure activity.     Time  8    Period  Weeks    Status  New      PT LONG TERM GOAL  #9   TITLE  Pt will report return to gym activity in order to continue  gains made in therapy.     Time  8    Period  Weeks    Status  On-going            Plan - 05/08/18 1106    Clinical Impression Statement  Skilled session continued to address safety with treadmill and adjusting speed safely, gait outdoors and HEP.  Note that she continues to have difficulty with LLE plantar flexion and would like for her to bring heels into next session to pracitice.  Also discussed extending POC for another 4 weeks to address deficits.      Rehab Potential  Good    PT Frequency  2x / week    PT Duration  8 weeks    PT Treatment/Interventions  ADLs/Self Care Home Management;Electrical Stimulation;DME Instruction;Gait training;Stair training;Functional mobility training;Therapeutic activities;Therapeutic exercise;Balance training;Neuromuscular re-education;Patient/family education;Orthotic Fit/Training;Passive range of motion;Taping    PT Next Visit Plan  gait training in heels (PT requested she bring to PT sessions), treadmill, outdoor gait, improving LLE plantar flexion, LLE single limb exercises.     Consulted and Agree with Plan of Care  Patient       Patient will benefit from skilled therapeutic intervention in order to improve the following deficits and impairments:  Abnormal gait, Decreased balance, Decreased strength, Difficulty walking, Postural dysfunction, Impaired sensation, Pain  Visit Diagnosis: Muscle weakness (generalized)  Unsteadiness on feet  Other abnormalities of gait and mobility     Problem List Patient Active Problem List   Diagnosis Date  Noted  . Slow transit constipation   . Greater trochanteric bursitis of left hip   . Acute blood loss anemia   . 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 Hyperlipemia 02/16/2018  . Cerebral edema ([place]) 02/16/2018  . Benign essential HTN   . Anemia of chronic disease   . Amphetamine abuse ([place])   . Hypokalemia   . 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 Pneumonia, community acquired 09/23/2015  . Microcytic hypochromic anemia 09/23/2015  . Tobacco abuse 09/23/2015  . Pneumonia 09/23/2015  . Nicotine addiction 12/09/2013  . Microcytic anemia 12/09/2013  . HTN (hypertension) 12/09/2013  . BMI 36.0-36.9,adult 12/09/2013    Cameron Sprang, PT, MPT [facility] [address] [place], [place], [address] Phone: [phone]   Fax:  [phone] 05/08/18, 7:40 PM  Name: Jocelyn [name] MRN: [id number] Date of Birth: 10/10/1966

## 2018-05-08 NOTE — Patient Instructions (Addendum)
  Feet Together (Compliant Surface) Head Motion - Eyes Closed    Stand on compliant surface: __pillow or foam______ with feet together. Close eyes and move head slowly, right/left, up/down, diagonal up-right/down-left and diagonal up-left/down-right. Repeat __10__ times each direction.   Feet Heel-Toe "Tandem", Head Motion - Eyes Open    With eyes open, right foot directly in front of the other, move head slowly: right/left, up/down, diagonal up-right/down-left and diagonal up-left/down-right. Repeat __10__ times each direction.    STANDING HEEL RAISES - SINGLE LEG  While standing on one leg, raise up on your toes as you lift your heel off the ground.  Use one hand support on counter or chair.               Do in gym with yoga mat and weight bench to the side of you for support as needed.  Start in standing, go down to right knee, bring left knee down and then bring left leg up, and return to stand.  Alternate which leg you go down with and which leg you come up on.  Do 3 reps on each side.      EOM bridge  Sit at edge of couch or bed (or weight bench in gym), place hands on either side of body. Push through arms and legs, tighten trunk muscles, and lift bottom up from surface. Hold for 2-3 seconds and slowly return to starting position. Do not arch back.  Do 5 reps, rest then do 5 more.  Keep arms tucked in and equal weight bearing between R and L arm.

## 2018-05-08 NOTE — Patient Instructions (Signed)
Prone - LUE hanging over for shoulder hyperextension with 1 pound weight - 10 reps x2, 2 sessions per day Supine - ER with yellow theraband 10 reps x2, 2 sessions per day.

## 2018-05-08 NOTE — Therapy (Signed)
[facility] [address] [place], [place], [address] Phone: [phone]   Fax:  [phone]  Occupational Therapy Treatment  Patient Details  Name: Jocelyn [name] MRN: [id number] Date of Birth: [birth year] Referring Provider: Dr. Claudette Laws   Encounter Date: 05/08/2018  OT End of Session - 05/08/18 1129    Visit Number  9    Date for OT Re-Evaluation  05/08/18    Authorization Type  [employer]  - 100 visit limit combined for PT and OT    OT Start Time  1017    OT Stop Time  1100    OT Time Calculation (min)  43 min    Activity Tolerance  Patient tolerated treatment well       Past Medical History:  Diagnosis Date  . Anemia   . Hypertension   . ICH (intracerebral hemorrhage) (HCC) 02/2018    Past Surgical History:  Procedure Laterality Date  . MYOMECTOMY      There were no vitals filed for this visit.  Subjective Assessment - 05/08/18 1024    Subjective   I am getting some pain in my shoulder with certain movements.     Pertinent History  R BG hemorrhage with edema and midline shift 02/13/2018    Patient Stated Goals  I want to be able to walk on my own without the brace or cane, I want to drive, I want my "normal" back    Currently in Pain?  No/denies but reports pain with shoulder hyperextention with IR; can be as high as a 5.                    OT Treatments/Exercises (OP) - 05/08/18 0001      Neurological Re-education Exercises   Other Exercises 1  Neuro re ed in sidelying, prone and supine to address scapular mobility as well as stable mobility.  Worked in patterns of overhead reach on moveable surface in sidelying with active stretching between humeral head and scapula;  also addressed scapular movement with ER in sideying using 1 pound weight. Addresesd shoulder hyperextension with ER moving to IR with resitstance. Transitioned into prone to address scapular stability  with shoulder  hyperextension using 1 pound weight. Pt was given this as part of HEP however is not doing at home. Discussed with pt that this exercise will help with pain. Also instructed pt in doing ER in supine using yellow theraband, 10 reps x2, 2 sessions per day at home. Pt able to return demonstrate however pt needs max cues for full effort in terms of  range and muscle recruitment.        Manual Therapy   Manual therapy comments  Joint, soft tissue and scapular mob to address scapular alignment (pt with elevated and abducted with resultant humeral abduction with some elbow flexion). Pt c/o of pain with shoulder hypextension and internal rotation as shoulder abduction.  Pt has approximately 125* of shoulder flexion with min compensations, 76* of  abduction, - 6* of pronation and 10* of ER - all iimproved by end of session.              OT Education - 05/08/18 1120    Education provided  Yes    Education Details  upgraded and reinforced HEP for LUE    Person(s) Educated  Patient    Methods  Explanation;Demonstration    Comprehension  Verbalized understanding;Returned demonstration       OT Short Term  Goals - 05/08/18 1121      OT SHORT TERM GOAL #1   Title  Pt and husband will be mod I with HEP for LUE for AROM/strength, and positioning. - 04/10/2018    Status  Achieved      OT SHORT TERM GOAL #2   Title  Pt will be supervision for bathing    Status  Achieved      OT SHORT TERM GOAL #3   Title  Pt will be mod I with dressing, AE prn    Status  Achieved      OT SHORT TERM GOAL #4   Title  Pt will be mod I with toilet transfers    Status  Achieved      OT SHORT TERM GOAL #5   Title  Pt will be mod I with splint wear and care, prn    Status  Deferred has not yet needed a splint      OT SHORT TERM GOAL #6   Title  Pt will demonstrate the ability for bilateral low reach to pick up a light weight object    Status  Achieved      OT SHORT TERM GOAL #7   Title  Pt will demonstrate the  ability for grasp and release for light small object    Status  Achieved        OT Long Term Goals - 05/08/18 1122      OT LONG TERM GOAL #1   Title  Pt will be mod I for LUE HEP for AROM/strength/functional use/coordination - 06/05/2018    Status  On-going      OT LONG TERM GOAL #2   Title  Pt will be mod I with bathing    Status  Achieved      OT LONG TERM GOAL #3   Title  Pt will be mod I with shower transfers    Status  Achieved      OT LONG TERM GOAL #4   Title  Pt will be min a for simple hot meal prep    Status  Achieved      OT LONG TERM GOAL #5   Title  Pt will be mod  I with loading clothes washer and dryer and folding clothes    Status  Achieved      OT LONG TERM GOAL #6   Title  Pt will verbalize understanding of driving evaluation information    Status  Achieved      OT LONG TERM GOAL #7   Title  Pt will demonstrate ability to use LUE for overhead reach (130* shoulder flexion)  into kitchen cabinet to obtain ingredients when cooking    Status  New      OT LONG TERM GOAL #8   Title  Pt will be able to type a 2 sentence paragraph with extra time and 85% accuracy.      Status  New            Plan - 05/08/18 1127    Clinical Impression Statement  Pt returns to clinic after missed appointments due to being ill.  WIll renew pt today and extend 4 additional weeks. Pt planning on returnig to work sometime in August.    Occupational Profile and client history currently impacting functional performance  HTN, HLD, anemia, chronic bursitis, +tobacco use,     Occupational performance deficits (Please refer to evaluation for details):  ADL's;IADL's;Work;Leisure;Social Participation    Rehab Potential  Good    OT Frequency  2x / week    OT Duration  8 weeks    OT Treatment/Interventions  Self-care/ADL training;Aquatic Therapy;Cryotherapy;Moist Heat;Electrical Stimulation;DME and/or AE instruction;Neuromuscular education;Therapeutic exercise;Functional Mobility  Training;Manual Therapy;Passive range of motion;Splinting;Therapeutic activities;Balance training;Patient/family education    Plan  coordination program for HEP, typing, NMR LUE and trunk- mid to high reach closed to open chain, interlimb coordination - in hand manipulation    Consulted and Agree with Plan of Care  Patient       Patient will benefit from skilled therapeutic intervention in order to improve the following deficits and impairments:  Abnormal gait, Decreased balance, Decreased coordination, Decreased mobility, Decreased range of motion, Difficulty walking, Decreased strength, Impaired UE functional use, Impaired tone, Impaired sensation  Visit Diagnosis: Abnormal posture - Plan: Ot plan of care cert/re-cert  Muscle weakness (generalized) - Plan: Ot plan of care cert/re-cert  Unsteadiness on feet - Plan: Ot plan of care cert/re-cert  Other symptoms and signs involving the nervous system - Plan: Ot plan of care cert/re-cert  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 Slow transit constipation   . Greater trochanteric bursitis of left hip   . Acute blood loss anemia   . Flaccid monoplegia of upper extremity (HCC)   . Hyperlipemia 02/16/2018  . Cerebral edema (HCC) 02/16/2018  . Benign essential HTN   . Anemia of chronic disease   . Amphetamine abuse (HCC)   . Hypokalemia   . 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 Pneumonia, community acquired 09/23/2015  . Microcytic hypochromic anemia 09/23/2015  . Tobacco abuse 09/23/2015  . Pneumonia 09/23/2015  . Nicotine addiction 12/09/2013  . Microcytic anemia 12/09/2013  . HTN (hypertension) 12/09/2013  . BMI 36.0-36.9,adult 12/09/2013    Norton Pastel, OTR/L 05/08/2018, 11:33 AM  [facility] [address] [place], [place], [address] Phone: [phone]   Fax:  [phone]  Name: Jocelyn [name] MRN: [id number] Date of Birth: Sep 03, 1966

## 2018-05-10 ENCOUNTER — Ambulatory Visit: Payer: 59 | Admitting: Occupational Therapy

## 2018-05-10 ENCOUNTER — Ambulatory Visit: Payer: 59 | Admitting: Physical Therapy

## 2018-05-15 ENCOUNTER — Encounter: Payer: Self-pay | Admitting: Rehabilitation

## 2018-05-15 ENCOUNTER — Encounter: Payer: Self-pay | Admitting: Occupational Therapy

## 2018-05-15 ENCOUNTER — Ambulatory Visit: Payer: 59 | Admitting: Rehabilitation

## 2018-05-15 ENCOUNTER — Ambulatory Visit: Payer: 59 | Admitting: Occupational Therapy

## 2018-05-15 DIAGNOSIS — M6281 Muscle weakness (generalized): Secondary | ICD-10-CM

## 2018-05-15 DIAGNOSIS — R29818 Other symptoms and signs involving the nervous system: Secondary | ICD-10-CM

## 2018-05-15 DIAGNOSIS — R2681 Unsteadiness on feet: Secondary | ICD-10-CM

## 2018-05-15 DIAGNOSIS — R293 Abnormal posture: Secondary | ICD-10-CM

## 2018-05-15 DIAGNOSIS — I69252 Hemiplegia and hemiparesis following other nontraumatic intracranial hemorrhage affecting left dominant side: Secondary | ICD-10-CM

## 2018-05-15 NOTE — Therapy (Signed)
[facility] [address] [place], [place], [address] Phone: [phone]   Fax:  [phone]  Occupational Therapy Treatment  Patient Details  Name: Jocelyn [name] MRN: [id number] Date of Birth: July 28, 1966 Referring Provider: Dr. Alysia Penna   Encounter Date: 05/15/2018    Past Medical History:  Diagnosis Date  . Anemia   . Hypertension   . ICH (intracerebral hemorrhage) ([place]) 02/2018    Past Surgical History:  Procedure Laterality Date  . MYOMECTOMY      There were no vitals filed for this visit.                          OT Short Term Goals - 05/15/18 1032      OT SHORT TERM GOAL #1   Title  Pt and husband will be mod I with HEP for LUE for AROM/strength, and positioning. - 04/10/2018    Status  Achieved      OT SHORT TERM GOAL #2   Title  Pt will be supervision for bathing    Status  Achieved      OT SHORT TERM GOAL #3   Title  Pt will be mod I with dressing, AE prn    Status  Achieved      OT SHORT TERM GOAL #4   Title  Pt will be mod I with toilet transfers    Status  Achieved      OT SHORT TERM GOAL #5   Title  Pt will be mod I with splint wear and care, prn    Status  Deferred has not yet needed a splint      OT SHORT TERM GOAL #6   Title  Pt will demonstrate the ability for bilateral low reach to pick up a light weight object    Status  Achieved      OT SHORT TERM GOAL #7   Title  Pt will demonstrate the ability for grasp and release for light small object    Status  Achieved        OT Long Term Goals - 05/15/18 1032      OT LONG TERM GOAL #1   Title  Pt will be mod I for LUE HEP for AROM/strength/functional use/coordination - 06/05/2018    Status  Achieved      OT LONG TERM GOAL #2   Title  Pt will be mod I with bathing    Status  Achieved      OT LONG TERM GOAL #3   Title  Pt will be mod I with shower transfers    Status  Achieved      OT LONG  TERM GOAL #4   Title  Pt will be min a for simple hot meal prep    Status  Achieved      OT LONG TERM GOAL #5   Title  Pt will be mod  I with loading clothes washer and dryer and folding clothes    Status  Achieved      OT LONG TERM GOAL #6   Title  Pt will verbalize understanding of driving evaluation information    Status  Achieved      OT LONG TERM GOAL #7   Title  Pt will demonstrate ability to use LUE for overhead reach (130* shoulder flexion)  into kitchen cabinet to obtain ingredients when cooking    Status  Not Met  OT LONG TERM GOAL #8   Title  Pt will be able to type a 2 sentence paragraph with extra time and 85% accuracy.      Status  Not Met            Plan - 05/15/18 1032    Clinical Impression Statement  Pt did not show for last 2 OT appts and has had several last minute cancellations. Contacted pt today via telephone and pt stated "I forgot I had an appointment and I am actually out of town."  Discussed with pt that it is very difficult to work toward goals with very inconsistent attendance and pt is aware at this point that OT and PT will d/c pt. Pt is also aware if she wishes to return to therapy she can do so with an MD order for eval and tx and an ability to commit to consistent attendance. Pt in agreement with plan.    Occupational Profile and client history currently impacting functional performance  HTN, HLD, anemia, chronic bursitis, +tobacco use,     Occupational performance deficits (Please refer to evaluation for details):  ADL's;IADL's;Work;Leisure;Social Participation    Rehab Potential  Good    OT Frequency  2x / week    OT Duration  8 weeks    OT Treatment/Interventions  Self-care/ADL training;Aquatic Therapy;Cryotherapy;Moist Heat;Electrical Stimulation;DME and/or AE instruction;Neuromuscular education;Therapeutic exercise;Functional Mobility Training;Manual Therapy;Passive range of motion;Splinting;Therapeutic activities;Balance  training;Patient/family education    Plan  d/c from OT today due multiple missed appts - see note above    Consulted and Agree with Plan of Care  Patient       Patient will benefit from skilled therapeutic intervention in order to improve the following deficits and impairments:  Abnormal gait, Decreased balance, Decreased coordination, Decreased mobility, Decreased range of motion, Difficulty walking, Decreased strength, Impaired UE functional use, Impaired tone, Impaired sensation  Visit Diagnosis: Abnormal posture  Muscle weakness (generalized)  Unsteadiness on feet  Other symptoms and signs involving the nervous system  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 Slow transit constipation   . Greater trochanteric bursitis of left hip   . Acute blood loss anemia   . 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 Hyperlipemia 02/16/2018  . Cerebral edema ([place]) 02/16/2018  . Benign essential HTN   . Anemia of chronic disease   . Amphetamine abuse ([place])   . Hypokalemia   . 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 Pneumonia, community acquired 09/23/2015  . Microcytic hypochromic anemia 09/23/2015  . Tobacco abuse 09/23/2015  . Pneumonia 09/23/2015  . Nicotine addiction 12/09/2013  . Microcytic anemia 12/09/2013  . HTN (hypertension) 12/09/2013  . BMI 36.0-36.9,adult 12/09/2013   OCCUPATIONAL THERAPY DISCHARGE SUMMARY  Visits from Start of Care: 9  Current functional level related to goals / functional outcomes: See above   Remaining deficits: R hemiplegia, decreased RUE functional use   Education / Equipment: HEP Plan: Patient agrees to discharge.  Patient goals were partially met. Patient is being discharged due to not returning since the last visit.  ?????      Quay Burow ,  OTR/L 05/15/2018, 10:36 AM  Jocelyn [name] [address] [place], [place], [address] Phone: [phone]   Fax:  [phone]  Name: Jocelyn [name] MRN: [id number] Date  of Birth: 1966-08-23

## 2018-05-15 NOTE — Therapy (Signed)
[facility] [address] [place], [place], [address] Phone: [phone]   Fax:  [phone]  Patient Details  Name: Jocelyn [name] MRN: [id number] Date of Birth: 1966/06/23 Referring Provider:  No ref. provider found  Encounter Date: 05/15/2018   PHYSICAL THERAPY DISCHARGE SUMMARY  Visits from Start of Care: 10  Current functional level related to goals / functional outcomes: PT Long Term Goals - 05/08/18 1125      PT LONG TERM GOAL #1   Title  Pt will perform HEP independently  (all LTGs due 05/13/18)    Time  8    Period  Weeks    Status  On-going      PT LONG TERM GOAL #2   Title  Pt will improve overall function to >/= 60% on FOTO    Baseline  40%    Time  8    Period  Weeks    Status  On-going      PT LONG TERM GOAL #3   Title  Pt will improve balance, decrease falls risk as indicated by increase in BERG score by 8 points    Baseline  MET 04/14/2018   Merrilee Jansky 56/56    Time  8    Period  Weeks    Status  Achieved      PT LONG TERM GOAL #4   Title  Pt will improve gait velocity to >/= 3.00 ft/sec with most appropriate AFO and LRAD    Baseline  04/12/18:  2.53 ft/sec with no AD with just heel wedge to left shoue    Status  On-going      PT LONG TERM GOAL #5   Title  Pt will improve five time sit to stand to </= 16 seconds with equal WB through R/LLE    Baseline  04/12/18: 13.10 sec's with no UE support and equal weight bearing    Status  Achieved      PT LONG TERM GOAL #6   Title  Pt will ambulate >1000' without device, without AFO indpendently (up/down curbs, grassy, hills) in order to return to community and leisure activity.     Baseline  05/08/18    Time  8    Period  Weeks    Status  Achieved      PT LONG TERM GOAL #7   Title  Pt will improve FGA to >/=28/30 in order to indicate decreased fall risk.     Time  8    Period  Weeks    Status  On-going      PT LONG TERM GOAL #8   Title  Pt will  tolerate wearing 2-3" heels safely without overt LOB over level surfaces at independent level in order to return to leisure activity.     Time  8    Period  Weeks    Status  New      PT LONG TERM GOAL  #9   TITLE  Pt will report return to gym activity in order to continue gains made in therapy.     Time  8    Period  Weeks    Status  On-going         Remaining deficits: Unsure as she has been very inconsistent with PT/OT attendance.  We are going to D/C at this time and she knows to get new order if wanting to return to PT/OT.    Education / Equipment: HEP  Plan: Patient agrees to  discharge.  Patient goals were not met. Patient is being discharged due to not returning since the last visit.  ?????        Cameron Sprang, PT, MPT [facility] [address] [place], [place], [address] Phone: [phone]   Fax:  [phone] 05/15/18, 1:26 PM

## 2018-05-18 ENCOUNTER — Encounter: Payer: 59 | Admitting: Occupational Therapy

## 2018-05-18 ENCOUNTER — Ambulatory Visit: Payer: 59 | Admitting: Rehabilitation

## 2018-05-22 ENCOUNTER — Ambulatory Visit: Payer: 59 | Admitting: Rehabilitation

## 2018-05-22 ENCOUNTER — Encounter: Payer: 59 | Admitting: Occupational Therapy

## 2018-05-24 ENCOUNTER — Encounter: Payer: 59 | Admitting: Occupational Therapy

## 2018-05-24 ENCOUNTER — Ambulatory Visit: Payer: 59 | Admitting: Physical Therapy

## 2018-05-29 ENCOUNTER — Encounter: Payer: 59 | Admitting: Occupational Therapy

## 2018-05-29 ENCOUNTER — Ambulatory Visit: Payer: 59 | Admitting: Rehabilitation

## 2018-06-01 ENCOUNTER — Ambulatory Visit: Payer: 59 | Admitting: Rehabilitation

## 2018-06-01 ENCOUNTER — Encounter: Payer: 59 | Admitting: Occupational Therapy

## 2018-06-02 ENCOUNTER — Encounter: Payer: Self-pay | Admitting: Physical Medicine & Rehabilitation

## 2018-06-02 ENCOUNTER — Encounter: Payer: 59 | Attending: Physical Medicine & Rehabilitation

## 2018-06-02 ENCOUNTER — Ambulatory Visit: Payer: 59 | Admitting: Physical Medicine & Rehabilitation

## 2018-06-02 VITALS — BP 120/80 | HR 85 | Resp 14 | Ht 67.0 in | Wt 167.0 lb

## 2018-06-02 DIAGNOSIS — Z79899 Other long term (current) drug therapy: Secondary | ICD-10-CM | POA: Insufficient documentation

## 2018-06-02 DIAGNOSIS — R269 Unspecified abnormalities of gait and mobility: Secondary | ICD-10-CM | POA: Diagnosis not present

## 2018-06-02 DIAGNOSIS — I69398 Other sequelae of cerebral infarction: Secondary | ICD-10-CM | POA: Diagnosis present

## 2018-06-02 DIAGNOSIS — I10 Essential (primary) hypertension: Secondary | ICD-10-CM | POA: Diagnosis not present

## 2018-06-02 DIAGNOSIS — I69154 Hemiplegia and hemiparesis following nontraumatic intracerebral hemorrhage affecting left non-dominant side: Secondary | ICD-10-CM | POA: Diagnosis not present

## 2018-06-02 DIAGNOSIS — F17210 Nicotine dependence, cigarettes, uncomplicated: Secondary | ICD-10-CM | POA: Insufficient documentation

## 2018-06-02 NOTE — Progress Notes (Signed)
Subjective:    Patient ID: Jocelyn [name], female    DOB: 03-02-1966, 52 y.o.   MRN: [id number]  HPI  No issues since last visit Driving without issue  Pain Inventory Average Pain 6 Pain Right Now 0 My pain is intermittent and aching  In the last 24 hours, has pain interfered with the following? General activity 0 Relation with others 0 Enjoyment of life 0 What TIME of day is your pain at its worst? morning Sleep (in general) Fair  Pain is worse with: unsure Pain improves with: rest and therapy/exercise Relief from Meds: 1  Mobility walk without assistance ability to climb steps?  no do you drive?  yes transfers alone  Function disabled: date disabled short term disability  Neuro/Psych No problems in this area  Prior Studies Any changes since last visit?  no  Physicians involved in your care Any changes since last visit?  no   Family History  Problem Relation Age of Onset  . Diabetes Mellitus II Neg Hx   . Hypertension Neg Hx    Social History   Socioeconomic History  . Marital status: Married    Spouse name: Not on file  . Number of children: Not on file  . Years of education: Not on file  . Highest education level: Not on file  Occupational History  . Not on file  Social Needs  . Financial resource strain: Not on file  . Food insecurity:    Worry: Not on file    Inability: Not on file  . Transportation needs:    Medical: Not on file    Non-medical: Not on file  Tobacco Use  . Smoking status: Current Every Day Smoker    Packs/day: 0.20    Years: 5.00    Pack years: 1.00    Types: Cigarettes  . Smokeless tobacco: Never Used  Substance and Sexual Activity  . Alcohol use: Yes    Alcohol/week: 0.0 oz    Comment: occasionally  . Drug use: No  . Sexual activity: Yes    Birth control/protection: Abstinence  Lifestyle  . Physical activity:    Days per week: Not on file    Minutes per session: Not on file  . Stress: Not on file    Relationships  . Social connections:    Talks on phone: Not on file    Gets together: Not on file    Attends religious service: Not on file    Active member of club or organization: Not on file    Attends meetings of clubs or organizations: Not on file    Relationship status: Not on file  Other Topics Concern  . Not on file  Social History Narrative  . Not on file   Past Surgical History:  Procedure Laterality Date  . MYOMECTOMY     Past Medical History:  Diagnosis Date  . Anemia   . Hypertension   . ICH (intracerebral hemorrhage) (HCC) 02/2018   BP 120/80 (BP Location: Left Arm, Patient Position: Sitting, Cuff Size: Normal)   Pulse 85   Resp 14   Ht 5\' 7"  (1.702 m)   Wt 167 lb (75.8 kg)   SpO2 98%   BMI 26.16 kg/m   Opioid Risk Score:   Fall Risk Score:  `1  Depression screen PHQ 2/9  [facility] 03/21[facility]2018 11/05/2015  Decreased Interest 0 0  Down, Depressed, Hopeless 0 0  PHQ - 2 Score 0 0  Altered sleeping 0 -  Tired, decreased energy 0 -  Change in appetite 0 -  Feeling bad or failure about yourself  0 -  Trouble concentrating 0 -  Moving slowly or fidgety/restless 0 -  Suicidal thoughts 0 -  PHQ-9 Score 0 -  Difficult doing work/chores Not difficult at all -    Review of Systems  Constitutional: Negative.   HENT: Negative.   Eyes: Negative.   Respiratory: Negative.   Cardiovascular: Negative.   Gastrointestinal: Negative.   Endocrine: Negative.   Genitourinary: Negative.   Musculoskeletal: Positive for arthralgias.  Skin: Negative.   Allergic/Immunologic: Negative.   Neurological: Negative.   Hematological: Negative.   Psychiatric/Behavioral: Negative.   All other systems reviewed and are negative.      Objective:   Physical Exam  Constitutional: She is oriented to person, place, and time. She appears well-developed and well-nourished.  Neurological: She is alert and oriented to person, place, and time.  Nursing note and  vitals reviewed.  Fine motor deficits- LUE Motor 5/5 in BUE and BLE       Assessment & Plan:  1.  R basal ganglia ICH- left hemi , fine motor def Will have pt f/u in 3 mo, may be ready for PT, may need voice recognition for acomodation

## 2018-06-14 ENCOUNTER — Telehealth: Payer: Self-pay | Admitting: Physical Medicine & Rehabilitation

## 2018-06-14 NOTE — Telephone Encounter (Signed)
Alex with [employer] [phone] called office and hs questions about disability form  1.  On return to work part time how long will the part time last?  Will she need follow up visits  To determine this - will she need intermittant days to follow up with you?  Does she need reduced scheduled for how long? 2.  If a Production managercomputer software is purchased with dictation ability and implemented could she return to full time with that software. -- If you will advise I will call her back.    The questions are based on the disability forms you completed recently.

## 2018-06-15 NOTE — Telephone Encounter (Signed)
I plan to see her in office and then make determination for return to part time with accomodation of voice recognition software to compensate for typing .  THe transition for part time to full time is usually 1-2 months

## 2018-06-20 ENCOUNTER — Other Ambulatory Visit: Payer: Self-pay | Admitting: Obstetrics and Gynecology

## 2018-06-20 ENCOUNTER — Other Ambulatory Visit (HOSPITAL_COMMUNITY)
Admission: RE | Admit: 2018-06-20 | Discharge: 2018-06-20 | Disposition: A | Discharge status: Home / Self Care | Payer: 59 | Source: Ambulatory Visit | Attending: Obstetrics and Gynecology | Admitting: Obstetrics and Gynecology

## 2018-06-20 DIAGNOSIS — Z01411 Encounter for gynecological examination (general) (routine) with abnormal findings: Secondary | ICD-10-CM | POA: Insufficient documentation

## 2018-06-23 LAB — CYTOLOGY - PAP
Diagnosis: NEGATIVE
HPV: NOT DETECTED

## 2018-07-05 ENCOUNTER — Other Ambulatory Visit: Payer: Self-pay | Admitting: Obstetrics and Gynecology

## 2018-07-13 NOTE — Progress Notes (Signed)
[facility] [address] [place]. [place] [address] [phone]       OFFICE FOLLOW UP NOTE  [name] Date of Birth:  10-Sep-1966 Medical Record Number:  [id number]   Reason for Referral:  hospital ICH follow up  CHIEF COMPLAINT:  Chief Complaint  Patient presents with  . Follow-up    Rm 9, alone.  . ICH    doing well.      HPI: Jocelyn [name] is being seen today for initial visit in the office for ICH on 02/13/18. History obtained from patient and chart review. Reviewed all radiology images and labs personally.  Jocelyn R Williamsis a 52 y.o.femalewith a history of hypertension and anemiawho lives alone and apparently was last known normal at approximately 6:30 PM on Sunday evening 02/12/2018. A friend called her the morning of 02/13/18 however, the patient did not answer the phone and her friend came to her house. When she arrived she found the patient sitting on the floor next to her bed unable to get up. EMS was called and the patient was brought to [facility] as a code stroke. In route EMS noted left sided weakness, left facial droop, and slurred speech. The patient was noted to be oriented. On arrival to the emergency department the patient's exam was much the same. NIH Stroke Scale was 9.A stat CT scan of the head was performed that revealed an intracerebral hemorrhage with 4 mm midline shift. The patient states she was not taking any medications prior to admission. She was admitted to the neuro ICU for further evaluation and treatment.   CTA of the head and neck were unremarkable.  Repeat CT scan on 02/14/2018 showed a stable hematoma.  Additional repeat CT scan on 02/16/2018 showed stable hematoma without increase in hemorrhage but did show expected mild increase in surrounding edema.  2D echo showed an EF of 60 to 65% without cardiac source of embolus.  LDL 115 and as patient was not on statin PTA it was recommended to start Lipitor 20  mg daily.  A1c satisfactory at 5.3.  No antithrombotic PTA and continue this through discharge due to ICH.  Patient was discharged to CIR in stable condition.  50/04/2018 visit: Patient is being seen today for hospital follow-up and is accompanied by her husband.  Patient has been participating in our neuro rehab clinic for ongoing PT/OT therapy and has been making great improvement.  Continues to have slight left arm weakness but denies numbness, tingling or pain.  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  She has not started driving at this time but per OT, they believe she is safe to start driving at this time.  Continues to take Lipitor without side effects of myalgias.  Blood pressure today satisfactory 119/81.  Denies new or worsening stroke/TIA symptoms.  Interval history 50/04/2018: Patient is being seen today for scheduled follow-up appointment.  She continues to have some numbness and tingling in her left thumb, pointer finger and middle finger.  She states this can be intermittent but usually does have increased numbness upon awakening.  She has completed all therapies but continues to do home exercises due to continued decreased left hand dexterity.  Continues to take Lipitor without side effects myalgias.  Blood pressure today satisfactory 122/83 and as she does continue to monitor this at home, this is her typical level.  She plans  on returning to work the first week of October.  Denies new or worsening stroke/TIA symptoms.   ROS:   14 system review of systems performed and negative with exception of insomnia and numbness  PMH:  Past Medical History:  Diagnosis Date  . Anemia   . Hypertension   . ICH (intracerebral hemorrhage) (HCC) 02/2018    PSH:  Past Surgical History:  Procedure Laterality Date  . MYOMECTOMY      Social History:  Social History   Socioeconomic History  . Marital status: Married    Spouse name:  Not on file  . Number of children: Not on file  . Years of education: Not on file  . Highest education level: Not on file  Occupational History  . Not on file  Social Needs  . Financial resource strain: Not on file  . Food insecurity:    Worry: Not on file    Inability: Not on file  . Transportation needs:    Medical: Not on file    Non-medical: Not on file  Tobacco Use  . Smoking status: Current Every Day Smoker    Packs/day: 0.20    Years: 5.00    Pack years: 1.00    Types: Cigarettes  . Smokeless tobacco: Never Used  Substance and Sexual Activity  . Alcohol use: Yes    Alcohol/week: 0.0 standard drinks    Comment: occasionally  . Drug use: No  . Sexual activity: Yes    Birth control/protection: Abstinence  Lifestyle  . Physical activity:    Days per week: Not on file    Minutes per session: Not on file  . Stress: Not on file  Relationships  . Social connections:    Talks on phone: Not on file    Gets together: Not on file    Attends religious service: Not on file    Active member of club or organization: Not on file    Attends meetings of clubs or organizations: Not on file    Relationship status: Not on file  . Intimate partner violence:    Fear of current or ex partner: Not on file    Emotionally abused: Not on file    Physically abused: Not on file    Forced sexual activity: Not on file  Other Topics Concern  . Not on file  Social History Narrative  . Not on file    Family History:  Family History  Problem Relation Age of Onset  . Diabetes Mellitus II Neg Hx   . Hypertension Neg Hx     Medications:   Current Outpatient Medications on File Prior to Visit  Medication Sig Dispense Refill  . acetaminophen (TYLENOL) 325 MG tablet Take 1-2 tablets (325-650 mg total) by mouth every 4 (four) hours as needed for mild pain.    Marland Kitchen amLODipine (NORVASC) 10 MG tablet Take 1 tablet (10 mg total) by mouth daily. 30 tablet 0  . atorvastatin (LIPITOR) 20 MG tablet  TK 1 T PO QPM  1  . cyanocobalamin 1000 MCG tablet Take 1 tablet (1,000 mcg total) by mouth daily. 30 tablet 0  . diclofenac sodium (VOLTAREN) 1 % GEL Apply 2 g topically 4 (four) times daily. 4 Tube 0  . ferrous sulfate 325 (65 FE) MG tablet Take 1 tablet (325 mg total) by mouth 3 (three) times daily with meals. 90 tablet 0  . FLUoxetine (PROZAC) 10 MG capsule Take 1 capsule (10 mg total) by mouth daily. 30 capsule 0  .  lisinopril (PRINIVIL,ZESTRIL) 20 MG tablet Take 1 tablet (20 mg total) by mouth 2 (two) times daily. 60 tablet 0   No current facility-administered medications on file prior to visit.     Allergies:  No Known Allergies   Physical Exam  Vitals:   07/14/18 0956  BP: 122/83  Pulse: 73  Weight: 163 lb 6.4 oz (74.1 kg)  Height: 5\' 7"  (1.702 m)   Body mass index is 25.59 kg/m. No exam data present  General: well developed, pleasant middle-aged African-American female, well nourished, seated, in no evident distress Head: head normocephalic and atraumatic.   Neck: supple with no carotid or supraclavicular bruits Cardiovascular: regular rate and rhythm, no murmurs Musculoskeletal: no deformity Skin:  no rash/petichiae Vascular:  Normal pulses all extremities  Neurologic Exam Mental Status: Awake and fully alert. Oriented to place and time. Recent and remote memory intact. Attention span, concentration and fund of knowledge appropriate. Mood and affect appropriate.  Cranial Nerves: Fundoscopic exam reveals sharp disc margins. Pupils equal, briskly reactive to light. Extraocular movements full without nystagmus. Visual fields full to confrontation. Hearing intact. Facial sensation intact. Face, tongue, palate moves normally and symmetrically.  Motor: Normal bulk and tone. Normal strength in all tested extremity muscles.  5/5 strength in all  extremities tested except for mild weakness in left grip Sensory.: intact to touch , pinprick , position and vibratory sensation.    Coordination: Rapid alternating movements normal in all extremities. Finger-to-nose and heel-to-shin performed accurately bilaterally.  Decreased finger dexterity in left fingers; orbits right arm over left arm Gait and Station: Arises from chair without difficulty. Stance is normal. Gait demonstrates normal stride length and balance . Able to heel, toe and tandem walk without difficulty.  Reflexes: 1+ and symmetric. Toes downgoing.      Diagnostic Data (Labs, Imaging, Testing)  CT HEAD WO CONTRAST 02/13/2018 IMPRESSION: Acute right basal ganglia hemorrhage with mild edema and 4 mm of leftward midline shift.  CT HEAD WO CONTRAST (REPEAT) 02/14/2018 IMPRESSION: 1. Patent carotid and vertebral arteries. No dissection, aneurysm, or hemodynamically significant stenosis utilizing NASCET criteria. 2. Patent anterior and posterior intracranial circulation. No large vessel occlusion, aneurysm, or significant stenosis. No vascular malformation. 3. Stable hematoma in right basal ganglia measuring up to 3.7 cm. Stable associated edema and mass effect with 4 mm right-to-left midline shift. No new acute intracranial abnormality identified.  CT ANGIO NECK W OR WO CONTRAST CT ANGIO HEAD W OR WO CONTRAST 02/14/2018 IMPRESSION: 1. Patent carotid and vertebral arteries. No dissection, aneurysm, or hemodynamically significant stenosis utilizing NASCET criteria. 2. Patent anterior and posterior intracranial circulation. No large vessel occlusion, aneurysm, or significant stenosis. No vascular malformation. 3. Stable hematoma in right basal ganglia measuring up to 3.7 cm. Stable associated edema and mass effect with 4 mm right-to-left midline shift. No new acute intracranial abnormality identified.  CT HEAD WO CONTRAST(REPEAT) 02/16/2018 IMPRESSION: No increased bleeding in the right lateral basal ganglia/external capsule region. Maximal hematoma dimension is 3.8 cm. Slightly more surrounding  vasogenic edema with right to left shift of 5 mm.  ECHOCARDIOGRAM 02/14/2018 Impressions: - LVEF 60-65%, normal wall thickness, normal wall motion, normal   diastolic function, aortic valve sclerosis, normal LA size, mild   RAE, normal IVC.    ASSESSMENT: Jocelyn [name] is a 52 y.o. 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 Vascular risk factors include HTN and HLD.  Patient is being seen today for follow-up visit and overall continues  to do well with only mild left hand weakness and intermittent left hand numbness.    PLAN: -Continue  lipitor  for secondary stroke prevention and cholesterol control -No antithrombotic needed at this time as ICH was due to hypertension and blood pressure has been maintained. Patient does not have a history of CAD -F/u with PCP regarding your HLD and HTN management -Unable to determine if intermittent numbness is due to stroke versus possible carpal tunnel.  Recommended to try wrist splint at night and during the day as needed to see if this helps relieve the numbness. -Recommended continue to do home therapies for continued left hand weakness -continue to monitor BP at home -Continue PT/OT and advised patient that if she needs additional orders to contact us -Maintain strict control of hypertension with blood pressure goal below 130/90, diabetes with hemoglobin A1c goal below 6.5% and cholesterol with LDL cholesterol (bad cholesterol) goal below 70 mg/dL. I also advised the patient to eat a healthy diet with plenty of whole grains, cereals, fruits and vegetables, exercise regularly and maintain ideal body weight.  Follow up in 6 months or call earlier if needed   Greater than 50% of time during this 25 minute visit was spent on counseling,explanation of diagnosis of ICH, reviewing risk factor management of HLD and HTN, planning of further management, discussion with patient and family and coordination of  care    George Hugh, [facility]  [facility] [address] [place], [place] [address]  Phone [phone] Fax [phone]

## 2018-07-14 ENCOUNTER — Encounter: Payer: Self-pay | Admitting: Adult Health

## 2018-07-14 ENCOUNTER — Ambulatory Visit: Payer: 59 | Admitting: Adult Health

## 2018-07-14 VITALS — BP 122/83 | HR 73 | Ht 67.0 in | Wt 163.4 lb

## 2018-07-14 DIAGNOSIS — I610 Nontraumatic intracerebral hemorrhage in hemisphere, subcortical: Secondary | ICD-10-CM | POA: Diagnosis not present

## 2018-07-14 DIAGNOSIS — E785 Hyperlipidemia, unspecified: Secondary | ICD-10-CM

## 2018-07-14 DIAGNOSIS — I10 Essential (primary) hypertension: Secondary | ICD-10-CM

## 2018-07-14 NOTE — Patient Instructions (Signed)
Continue lipitor  for secondary stroke prevention  Continue to follow up with PCP regarding cholesterol and blood pressure management   Continue to do home exercises for continued left upper extremity/hand weakness   Continue to monitor blood pressure at home  Maintain strict control of hypertension with blood pressure goal below 130/90, diabetes with hemoglobin A1c goal below 6.5% and cholesterol with LDL cholesterol (bad cholesterol) goal below 70 mg/dL. I also advised the patient to eat a healthy diet with plenty of whole grains, cereals, fruits and vegetables, exercise regularly and maintain ideal body weight.  Followup in the future with me in 6 months or call earlier if needed       Thank you for coming to see [place] at [facility]. I hope we have been able to provide you high quality care today.  You may receive a patient satisfaction survey over the next few weeks. We would appreciate your feedback and comments so that we may continue to improve ourselves and the health of our patients.

## 2018-07-14 NOTE — Progress Notes (Signed)
I agree with the above plan 

## 2018-08-11 ENCOUNTER — Encounter: Payer: 59 | Attending: Physical Medicine & Rehabilitation

## 2018-08-11 ENCOUNTER — Encounter: Payer: Self-pay | Admitting: Physical Medicine & Rehabilitation

## 2018-08-11 ENCOUNTER — Ambulatory Visit: Payer: 59 | Admitting: Physical Medicine & Rehabilitation

## 2018-08-11 VITALS — BP 144/90 | HR 76 | Resp 14 | Ht 67.0 in | Wt 164.0 lb

## 2018-08-11 DIAGNOSIS — I69398 Other sequelae of cerebral infarction: Secondary | ICD-10-CM | POA: Insufficient documentation

## 2018-08-11 DIAGNOSIS — R269 Unspecified abnormalities of gait and mobility: Secondary | ICD-10-CM | POA: Diagnosis not present

## 2018-08-11 DIAGNOSIS — F17210 Nicotine dependence, cigarettes, uncomplicated: Secondary | ICD-10-CM | POA: Diagnosis not present

## 2018-08-11 DIAGNOSIS — I10 Essential (primary) hypertension: Secondary | ICD-10-CM | POA: Diagnosis not present

## 2018-08-11 DIAGNOSIS — I69154 Hemiplegia and hemiparesis following nontraumatic intracerebral hemorrhage affecting left non-dominant side: Secondary | ICD-10-CM

## 2018-08-11 DIAGNOSIS — R202 Paresthesia of skin: Secondary | ICD-10-CM

## 2018-08-11 DIAGNOSIS — Z79899 Other long term (current) drug therapy: Secondary | ICD-10-CM | POA: Insufficient documentation

## 2018-08-11 NOTE — Progress Notes (Signed)
Subjective:    Patient ID: Jocelyn [name], female    DOB: 28-Feb-1966, 52 y.o.   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  She has completed both inpatient and outpatient rehabilitation.  She is independent with all her self-care and mobility. Has not taken BP med yet today. Patient has had recent neurology visit and continues to follow-up with her primary care physician.  Returning to work on Tuesday.  Energy level is good, no naps .  Does her own housework.   Practicing typing and doing better. No new forms needed Driving ok.   Patient's main concern is tingling in the thumb index and middle finger of the left hand.  This occurs both day and night.  It is not progressing with time but has not gotten better.  She has tried a wrist splint but that has not been helpful.  She does not note any progressive weakness in her left hand.  Pain Inventory Average Pain 0 Pain Right Now 0 My pain is no pain  In the last 24 hours, has pain interfered with the following? General activity 0 Relation with others 0 Enjoyment of life 0 What TIME of day is your pain at its worst? no pain Sleep (in general) Poor  Pain is worse with: no pain Pain improves with: no pain Relief from Meds: no pain  Mobility walk without assistance ability to climb steps?  yes do you drive?  yes Do you have any goals in this area?  no  Function employed # of hrs/week .  Neuro/Psych numbness  Prior Studies Any changes since last visit?  no  Physicians involved in your care Any changes since last visit?  no   Family History  Problem Relation Age of Onset  . Diabetes Mellitus II Neg Hx   . Hypertension Neg Hx    Social History   Socioeconomic History  . Marital status: Married    Spouse name: Not on file  . Number of children: Not on file  . Years of education: Not on file  .  Highest education level: Not on file  Occupational History  . Not on file  Social Needs  . Financial resource strain: Not on file  . Food insecurity:    Worry: Not on file    Inability: Not on file  . Transportation needs:    Medical: Not on file    Non-medical: Not on file  Tobacco Use  . Smoking status: Current Every Day Smoker    Packs/day: 0.20    Years: 5.00    Pack years: 1.00    Types: Cigarettes  . Smokeless tobacco: Never Used  Substance and Sexual Activity  . Alcohol use: Yes    Alcohol/week: 0.0 standard drinks    Comment: occasionally  . Drug use: No  . Sexual activity: Yes    Birth control/protection: Abstinence  Lifestyle  . Physical activity:    Days per week: Not on file    Minutes per session: Not on file  . Stress: Not on file  Relationships  . Social connections:    Talks on phone: Not on file    Gets together: Not on file    Attends religious service: Not on file    Active member of club or organization: Not on file    Attends meetings of clubs or organizations: Not on file    Relationship  status: Not on file  Other Topics Concern  . Not on file  Social History Narrative  . Not on file   Past Surgical History:  Procedure Laterality Date  . MYOMECTOMY     Past Medical History:  Diagnosis Date  . Anemia   . Hypertension   . ICH (intracerebral hemorrhage) (HCC) 02/2018   BP (!) 144/90 (BP Location: Left Arm, Patient Position: Sitting, Cuff Size: Normal)   Pulse 76   Resp 14   Ht 5\' 7"  (1.702 m)   Wt 164 lb (74.4 kg)   SpO2 98%   BMI 25.69 kg/m   Opioid Risk Score:   Fall Risk Score:  `1  Depression screen PHQ 2/9  Depression screen [facility] 2/9 03/21/2018 11/05/2015  Decreased Interest 0 0  Down, Depressed, Hopeless 0 0  PHQ - 2 Score 0 0  Altered sleeping 0 -  Tired, decreased energy 0 -  Change in appetite 0 -  Feeling bad or failure about yourself  0 -  Trouble concentrating 0 -  Moving slowly or fidgety/restless 0 -  Suicidal  thoughts 0 -  PHQ-9 Score 0 -  Difficult doing work/chores Not difficult at all -   Review of Systems  Constitutional: Negative.   HENT: Negative.   Eyes: Negative.   Respiratory: Negative.   Cardiovascular: Negative.   Gastrointestinal: Negative.   Endocrine: Negative.   Genitourinary: Negative.   Musculoskeletal: Negative.   Skin: Negative.   Allergic/Immunologic: Negative.   Neurological: Positive for numbness.  Psychiatric/Behavioral: Negative.        Objective:   Physical Exam  Constitutional: She appears well-developed and well-nourished. No distress.  HENT:  Head: Normocephalic and atraumatic.  Eyes: Pupils are equal, round, and reactive to light. EOM are normal.  Neck: Normal range of motion.  Skin: She is not diaphoretic.  Nursing note and vitals reviewed. Motor strength is 5/5 in the right deltoid, bicep, tricep, grip, hip flexor, knee extensor, ankle dorsiflexor 4+ in the left deltoid, bicep, tricep, grip, hip flexor, 5 knee extensor, 4+ left ankle dorsiflexor Patient has reduced sensation to pinprick in the thumb index and middle finger as opposed to left fifth digit. Negative Tinel's Negative Phalen's Gait without evidence of toe drag or knee instability        Assessment & Plan:  1.  History of right basal ganglia hemorrhage, hypertensive, with residual mild left hemiparesis.  Overall she is actually at a modified independent level.  I do think she is able to go back to work and according to patient no longer has concerns about her typing ability.  She will return to work next week.  We will touch base on how she is coming along with this in 1 month when I see her for the EMG  2.  Left first second third digit tingling, has some reduction in pinprick sensation in those digits compared to the little finger.  Symptoms are not severe but bothersome.  Will check EMG/NCV left upper extremity, may need to do some comparisons to the right median sensory and motor as  well.  We will schedule in approximately 1 month.

## 2018-08-15 ENCOUNTER — Encounter: Payer: Self-pay | Admitting: Physical Medicine & Rehabilitation

## 2018-08-15 ENCOUNTER — Telehealth: Payer: Self-pay

## 2018-08-15 NOTE — Telephone Encounter (Signed)
Pt called stating she needs a return to work note faxed to her employer by the end of the day today. She returned to work today.  AttnSelena Batten Self fax# [phone].

## 2018-08-15 NOTE — Telephone Encounter (Signed)
Note typed, signed and faxed. Pt has been notified.

## 2018-09-12 ENCOUNTER — Ambulatory Visit: Payer: 59 | Admitting: Physical Medicine & Rehabilitation

## 2018-11-22 ENCOUNTER — Ambulatory Visit (HOSPITAL_COMMUNITY): Admission: RE | Admit: 2018-11-22 | Payer: 59 | Source: Ambulatory Visit | Admitting: Obstetrics and Gynecology

## 2018-11-22 SURGERY — DILATATION & CURETTAGE/HYSTEROSCOPY WITH MYOSURE
Anesthesia: Choice

## 2019-01-12 ENCOUNTER — Ambulatory Visit: Payer: 59 | Admitting: Adult Health

## 2019-02-06 ENCOUNTER — Ambulatory Visit: Payer: 59 | Admitting: Adult Health
